# Patient Record
Sex: Female | Born: 1965 | Race: Black or African American | Hispanic: No | Smoking: Former smoker
Health system: Southern US, Community
[De-identification: ages and names within clinical notes are randomized; demographics above are authoritative.]

## PROBLEM LIST (undated history)

## (undated) DIAGNOSIS — Z1231 Encounter for screening mammogram for malignant neoplasm of breast: Principal | ICD-10-CM

## (undated) DIAGNOSIS — Z95810 Presence of automatic (implantable) cardiac defibrillator: Secondary | ICD-10-CM

## (undated) DIAGNOSIS — E782 Mixed hyperlipidemia: Secondary | ICD-10-CM

## (undated) DIAGNOSIS — R748 Abnormal levels of other serum enzymes: Secondary | ICD-10-CM

## (undated) DIAGNOSIS — E559 Vitamin D deficiency, unspecified: Secondary | ICD-10-CM

## (undated) DIAGNOSIS — I10 Essential (primary) hypertension: Secondary | ICD-10-CM

## (undated) DIAGNOSIS — D860 Sarcoidosis of lung: Secondary | ICD-10-CM

## (undated) DIAGNOSIS — E669 Obesity, unspecified: Secondary | ICD-10-CM

## (undated) DIAGNOSIS — J4520 Mild intermittent asthma, uncomplicated: Secondary | ICD-10-CM

## (undated) DIAGNOSIS — G4733 Obstructive sleep apnea (adult) (pediatric): Secondary | ICD-10-CM

## (undated) DIAGNOSIS — K219 Gastro-esophageal reflux disease without esophagitis: Secondary | ICD-10-CM

## (undated) DIAGNOSIS — J381 Polyp of vocal cord and larynx: Secondary | ICD-10-CM

## (undated) DIAGNOSIS — J310 Chronic rhinitis: Secondary | ICD-10-CM

## (undated) DIAGNOSIS — D869 Sarcoidosis, unspecified: Secondary | ICD-10-CM

## (undated) DIAGNOSIS — B379 Candidiasis, unspecified: Secondary | ICD-10-CM

## (undated) DIAGNOSIS — T3695XA Adverse effect of unspecified systemic antibiotic, initial encounter: Secondary | ICD-10-CM

## (undated) DIAGNOSIS — Z6841 Body Mass Index (BMI) 40.0 and over, adult: Secondary | ICD-10-CM

## (undated) HISTORY — PX: DENTAL SURGERY: SHX609

## (undated) HISTORY — DX: Obesity, unspecified: E66.9

## (undated) HISTORY — DX: Obstructive sleep apnea (adult) (pediatric): G47.33

## (undated) HISTORY — DX: Sarcoidosis of lung: D86.0

## (undated) HISTORY — DX: Chronic rhinitis: J31.0

## (undated) HISTORY — PX: BRONCHOSCOPY: SUR163

## (undated) HISTORY — PX: FRACTURE SURGERY: SHX138

## (undated) HISTORY — DX: Gastro-esophageal reflux disease without esophagitis: K21.9

## (undated) HISTORY — DX: Mild intermittent asthma, uncomplicated: J45.20

## (undated) HISTORY — DX: Polyp of vocal cord and larynx: J38.1

## (undated) MED FILL — LORATADINE 10MG TABS: 10 MG | 30 days supply | Qty: 30 | Fill #0 | Status: AC

---

## 1999-03-31 ENCOUNTER — Ambulatory Visit: Admission: RE | Admit: 1999-03-31 | Discharge: 1999-03-31 | Payer: Self-pay | Admitting: Pulmonary Disease

## 1999-04-03 ENCOUNTER — Encounter: Payer: Self-pay | Admitting: Pulmonary Disease

## 1999-04-03 ENCOUNTER — Ambulatory Visit: Admission: RE | Admit: 1999-04-03 | Discharge: 1999-04-03 | Payer: Self-pay | Admitting: Pulmonary Disease

## 1999-04-03 ENCOUNTER — Encounter (INDEPENDENT_AMBULATORY_CARE_PROVIDER_SITE_OTHER): Payer: Self-pay

## 1999-04-03 ENCOUNTER — Other Ambulatory Visit: Admission: RE | Admit: 1999-04-03 | Discharge: 1999-04-03 | Payer: Self-pay | Admitting: Pulmonary Disease

## 1999-11-16 ENCOUNTER — Ambulatory Visit: Admission: RE | Admit: 1999-11-16 | Discharge: 1999-11-16 | Payer: Self-pay | Admitting: Pulmonary Disease

## 2000-03-19 ENCOUNTER — Ambulatory Visit: Admission: RE | Admit: 2000-03-19 | Discharge: 2000-03-19 | Payer: Self-pay | Admitting: Pulmonary Disease

## 2001-04-22 ENCOUNTER — Ambulatory Visit: Admission: RE | Admit: 2001-04-22 | Discharge: 2001-04-22 | Payer: Self-pay | Admitting: Pulmonary Disease

## 2003-08-19 ENCOUNTER — Emergency Department (HOSPITAL_COMMUNITY): Admission: EM | Admit: 2003-08-19 | Discharge: 2003-08-19 | Payer: Self-pay | Admitting: Emergency Medicine

## 2004-07-19 ENCOUNTER — Ambulatory Visit: Payer: Self-pay | Admitting: Pulmonary Disease

## 2004-09-12 ENCOUNTER — Ambulatory Visit: Payer: Self-pay | Admitting: Pulmonary Disease

## 2005-01-05 ENCOUNTER — Ambulatory Visit: Payer: Self-pay | Admitting: Pulmonary Disease

## 2005-06-13 ENCOUNTER — Ambulatory Visit: Payer: Self-pay | Admitting: Pulmonary Disease

## 2005-07-06 ENCOUNTER — Ambulatory Visit (HOSPITAL_BASED_OUTPATIENT_CLINIC_OR_DEPARTMENT_OTHER): Admission: RE | Admit: 2005-07-06 | Discharge: 2005-07-06 | Payer: Self-pay | Admitting: Pulmonary Disease

## 2005-07-19 ENCOUNTER — Ambulatory Visit: Payer: Self-pay | Admitting: Pulmonary Disease

## 2005-08-06 ENCOUNTER — Ambulatory Visit: Payer: Self-pay | Admitting: Pulmonary Disease

## 2005-09-03 ENCOUNTER — Ambulatory Visit: Payer: Self-pay | Admitting: Pulmonary Disease

## 2005-10-10 ENCOUNTER — Ambulatory Visit: Payer: Self-pay | Admitting: Emergency Medicine

## 2005-12-06 ENCOUNTER — Ambulatory Visit: Payer: Self-pay | Admitting: Pulmonary Disease

## 2006-05-01 ENCOUNTER — Ambulatory Visit: Payer: Self-pay | Admitting: Pulmonary Disease

## 2006-08-06 ENCOUNTER — Encounter: Admission: RE | Admit: 2006-08-06 | Discharge: 2006-11-04 | Payer: Self-pay | Admitting: Specialist

## 2006-08-28 ENCOUNTER — Ambulatory Visit: Payer: Self-pay | Admitting: Pulmonary Disease

## 2006-10-24 ENCOUNTER — Ambulatory Visit: Payer: Self-pay | Admitting: Pulmonary Disease

## 2007-04-01 ENCOUNTER — Ambulatory Visit: Payer: Self-pay | Admitting: Pulmonary Disease

## 2007-04-18 ENCOUNTER — Ambulatory Visit: Payer: Self-pay | Admitting: Pulmonary Disease

## 2007-06-12 ENCOUNTER — Telehealth (INDEPENDENT_AMBULATORY_CARE_PROVIDER_SITE_OTHER): Payer: Self-pay | Admitting: *Deleted

## 2007-07-16 ENCOUNTER — Ambulatory Visit: Payer: Self-pay | Admitting: Pulmonary Disease

## 2007-07-16 DIAGNOSIS — J310 Chronic rhinitis: Secondary | ICD-10-CM

## 2007-07-16 DIAGNOSIS — D860 Sarcoidosis of lung: Secondary | ICD-10-CM

## 2007-07-16 DIAGNOSIS — R498 Other voice and resonance disorders: Secondary | ICD-10-CM | POA: Insufficient documentation

## 2007-07-16 DIAGNOSIS — K219 Gastro-esophageal reflux disease without esophagitis: Secondary | ICD-10-CM | POA: Insufficient documentation

## 2007-07-16 DIAGNOSIS — G4733 Obstructive sleep apnea (adult) (pediatric): Secondary | ICD-10-CM

## 2007-07-31 ENCOUNTER — Encounter: Payer: Self-pay | Admitting: Pulmonary Disease

## 2007-08-12 ENCOUNTER — Encounter: Admission: RE | Admit: 2007-08-12 | Discharge: 2007-08-13 | Payer: Self-pay | Admitting: Otolaryngology

## 2007-09-04 ENCOUNTER — Encounter: Payer: Self-pay | Admitting: Pulmonary Disease

## 2007-09-08 ENCOUNTER — Telehealth (INDEPENDENT_AMBULATORY_CARE_PROVIDER_SITE_OTHER): Payer: Self-pay | Admitting: *Deleted

## 2007-09-24 ENCOUNTER — Ambulatory Visit: Payer: Self-pay | Admitting: Pulmonary Disease

## 2007-11-20 ENCOUNTER — Telehealth: Payer: Self-pay | Admitting: Internal Medicine

## 2007-11-20 DIAGNOSIS — H3400 Transient retinal artery occlusion, unspecified eye: Secondary | ICD-10-CM

## 2007-12-02 ENCOUNTER — Ambulatory Visit: Payer: Self-pay | Admitting: Pulmonary Disease

## 2007-12-02 LAB — CONVERTED CEMR LAB
AST: 30 units/L (ref 0–37)
Albumin: 3 g/dL — ABNORMAL LOW (ref 3.5–5.2)
BUN: 10 mg/dL (ref 6–23)
Basophils Relative: 0.3 % (ref 0.0–1.0)
Calcium: 9.3 mg/dL (ref 8.4–10.5)
Chloride: 105 meq/L (ref 96–112)
Creatinine, Ser: 1 mg/dL (ref 0.4–1.2)
Eosinophils Absolute: 0.4 10*3/uL (ref 0.0–0.7)
Eosinophils Relative: 7.7 % — ABNORMAL HIGH (ref 0.0–5.0)
GFR calc Af Amer: 79 mL/min
GFR calc non Af Amer: 65 mL/min
HCT: 31.2 % — ABNORMAL LOW (ref 36.0–46.0)
Hemoglobin: 10.2 g/dL — ABNORMAL LOW (ref 12.0–15.0)
MCHC: 32.8 g/dL (ref 30.0–36.0)
MCV: 79 fL (ref 78.0–100.0)
Monocytes Absolute: 0.5 10*3/uL (ref 0.1–1.0)
Neutro Abs: 2.7 10*3/uL (ref 1.4–7.7)
RBC: 3.96 M/uL (ref 3.87–5.11)
Sed Rate: 43 mm/hr — ABNORMAL HIGH (ref 0–22)
TSH: 1.62 microintl units/mL (ref 0.35–5.50)
WBC: 4.9 10*3/uL (ref 4.5–10.5)

## 2007-12-17 ENCOUNTER — Ambulatory Visit: Payer: Self-pay | Admitting: Internal Medicine

## 2007-12-18 ENCOUNTER — Telehealth: Payer: Self-pay | Admitting: Pulmonary Disease

## 2007-12-18 ENCOUNTER — Ambulatory Visit: Payer: Self-pay | Admitting: Pulmonary Disease

## 2007-12-30 ENCOUNTER — Ambulatory Visit: Payer: Self-pay | Admitting: Pulmonary Disease

## 2007-12-31 ENCOUNTER — Encounter: Payer: Self-pay | Admitting: Pulmonary Disease

## 2008-01-12 ENCOUNTER — Telehealth: Payer: Self-pay | Admitting: Pulmonary Disease

## 2008-01-15 ENCOUNTER — Encounter: Admission: RE | Admit: 2008-01-15 | Discharge: 2008-01-15 | Payer: Self-pay | Admitting: Family Medicine

## 2008-01-23 ENCOUNTER — Ambulatory Visit: Payer: Self-pay | Admitting: Pulmonary Disease

## 2008-02-25 ENCOUNTER — Ambulatory Visit: Payer: Self-pay | Admitting: Pulmonary Disease

## 2008-04-06 ENCOUNTER — Ambulatory Visit: Payer: Self-pay | Admitting: Pulmonary Disease

## 2008-08-23 ENCOUNTER — Ambulatory Visit: Payer: Self-pay | Admitting: Pulmonary Disease

## 2008-08-23 DIAGNOSIS — J4520 Mild intermittent asthma, uncomplicated: Secondary | ICD-10-CM | POA: Insufficient documentation

## 2008-08-25 ENCOUNTER — Telehealth: Payer: Self-pay | Admitting: Pulmonary Disease

## 2008-10-11 ENCOUNTER — Encounter: Payer: Self-pay | Admitting: Pulmonary Disease

## 2008-10-29 ENCOUNTER — Ambulatory Visit: Payer: Self-pay | Admitting: Pulmonary Disease

## 2008-10-29 ENCOUNTER — Encounter: Payer: Self-pay | Admitting: Pulmonary Disease

## 2009-01-21 ENCOUNTER — Encounter: Admission: RE | Admit: 2009-01-21 | Discharge: 2009-01-21 | Payer: Self-pay | Admitting: Family Medicine

## 2009-04-22 ENCOUNTER — Encounter (INDEPENDENT_AMBULATORY_CARE_PROVIDER_SITE_OTHER): Payer: Self-pay | Admitting: *Deleted

## 2009-04-22 ENCOUNTER — Ambulatory Visit: Payer: Self-pay | Admitting: Pulmonary Disease

## 2009-08-01 ENCOUNTER — Ambulatory Visit: Payer: Self-pay | Admitting: Pulmonary Disease

## 2009-08-01 DIAGNOSIS — Z6841 Body Mass Index (BMI) 40.0 and over, adult: Secondary | ICD-10-CM

## 2009-09-19 ENCOUNTER — Encounter: Payer: Self-pay | Admitting: Pulmonary Disease

## 2009-12-08 ENCOUNTER — Ambulatory Visit: Payer: Self-pay | Admitting: Pulmonary Disease

## 2009-12-08 DIAGNOSIS — F329 Major depressive disorder, single episode, unspecified: Secondary | ICD-10-CM

## 2010-01-05 ENCOUNTER — Telehealth (INDEPENDENT_AMBULATORY_CARE_PROVIDER_SITE_OTHER): Payer: Self-pay | Admitting: *Deleted

## 2010-06-08 ENCOUNTER — Ambulatory Visit: Payer: Self-pay | Admitting: Pulmonary Disease

## 2010-07-22 ENCOUNTER — Encounter: Payer: Self-pay | Admitting: Family Medicine

## 2010-07-23 ENCOUNTER — Encounter: Payer: Self-pay | Admitting: Family Medicine

## 2010-08-01 NOTE — Assessment & Plan Note (Signed)
Summary: 6 months/ mbw   Visit Type:  Follow-up Primary Provider/Referring Provider:  women's health  CC:  Asthma / Sarcoid / OSA...no breathing or sleep complaints...she does c/o sinus drainage w/ a dry cough x1 week.  History of Present Illness: I saw Anna [name] in follow up for her sarcoid with pulmonary and ocular invovlement, and mild OSA.  She continues to have intermittent episodes of sinus congestion.  This is not much of a problem.  She has been sleeping okay.  She is not having any problems with her breathing.    She is being followed by a new eye doctor, but does not recall which doctor this is.  Preventive Screening-Counseling & Management  Alcohol-Tobacco     Smoking Status: quit  Current Medications (verified): 1)  No Daily Medications  Allergies (verified): No Known Drug Allergies  Past History:  Past Medical History: Pulmonary and ocular sarcoidosis      - Biopsy 10/00      - PFT 10/29/08 FEV1 2.24 (77%), FVC 2.78 (74%), TLC 3.72 (68%), DLCO 61%, no BD response Vocal cord polyp with hoarseness      - Dr. Altamese Cabal GERD OSA      - PSG 11/03/05 RDI 6 Obesity Mild, intermittent asthma Rhinitis Glaucoma  Past Surgical History: Reviewed history from 12/02/2007 and no changes required. Bronchoscopy 10/00  Vital Signs:  Patient profile:   45 year old female Height:      66 inches (167.64 cm) Weight:      280.50 pounds (127.50 kg) BMI:     45.44 O2 Sat:      98 % on Room air Temp:     97.5 degrees F (36.39 degrees C) oral Pulse rate:   93 / minute BP sitting:   142 / 70  (left arm) Cuff size:   large  Vitals Entered By: Anna [name] CMA (June 08, 2010 9:38 AM)  O2 Sat at Rest %:  98 O2 Flow:  Room air CC: Asthma / Sarcoid / OSA...no breathing or sleep complaints...she does c/o sinus drainage w/ a dry cough x1 week Comments Medications reviewed with patient Anna [name] [facility]  June 08, 2010 9:39 AM   Physical Exam  General:  normal  appearance, healthy appearing, and obese.   Nose:  clear nasal discharge.   Mouth:  no oral lesion Neck:  no JVD.   Lungs:  no wheezing or rales Heart:  regular rhythm, normal rate, and no murmurs.   Extremities:  no edema Neurologic:  normal CN II-XII and strength normal.   Cervical Nodes:  no significant adenopathy Psych:  alert and cooperative; normal mood and affect; normal attention span and concentration   Impression & Recommendations:  Problem # 1:  ASTHMA (ICD-493.90) She is not currently on therapy for this.  Advised her to call if she has a flare of her asthma.  Problem # 2:  SARCOIDOSIS (ICD-135) This has been stable.  Will repeat chest xray today.  Problem # 3:  CHRONIC RHINITIS (ICD-472.0) Continue as needed therapy with nasal irrigation.  Complete Medication List: 1)  No Daily Medications   Other Orders: Est. Patient Level III (16109) T-2 View CXR (71020TC)  Patient Instructions: 1)  Chest xray today 2)  Follow up in one year

## 2010-08-01 NOTE — Assessment & Plan Note (Signed)
Summary: 4 months/apc   Visit Type:  Follow-up Primary Provider/Referring Provider:  women's health  CC:  Sarcoid.  The patient has no complaints today.Marland Kitchen  History of Present Illness: I saw Ms. Hoppe in follow up for her sarcoid with pulmonary and ocular invovlement, and mild OSA.  Her breathing has been doing okay.  She is not having cough, wheeze, sputum, chest pain, hemoptysis, or fever.  She has not had gland swelling or skin rash.  Her sinuses have been okay.  She was seen by her eye doctor earlier today, and was told her pressures were on the high side.  She is to have a follow up visit in two weeks.  She has been feeling depressed about what happened to her sister.  Current Medications (verified): 1)  No Daily Medications  Allergies (verified): No Known Drug Allergies  Past History:  Past Medical History: Reviewed history from 10/29/2008 and no changes required. Pulmonary and ocular sarcoidosis      - Biopsy 10/00      - PFT 10/29/08 FEV1 2.24 (77%), FVC 2.78 (74%), TLC 3.72 (68%), DLCO 61%, no BD response Vocal cord polyp with hoarseness      - Dr. Altamese Cabal GERD OSA      - PSG 11/03/05 RDI 6 Obesity Asthma Rhinitis Glaucoma  Past Surgical History: Reviewed history from 12/02/2007 and no changes required. Bronchoscopy 10/00  Vital Signs:  Patient profile:   45 year old female Height:      66 inches (167.64 cm) Weight:      288 pounds (130.91 kg) BMI:     46.65 O2 Sat:      98 % on Room air Temp:     98.3 degrees F (36.83 degrees C) oral Pulse rate:   116 / minute BP sitting:   122 / 80  (right arm) Cuff size:   large  Vitals Entered By: Michel Bickers CMA (December 08, 2009 1:42 PM)  O2 Sat at Rest %:  98 O2 Flow:  Room air  O2 Sat Comments Medications reviewed. Daytime phone verified. Michel Bickers CMA  December 08, 2009 1:43 PM  Physical Exam  General:  normal appearance, healthy appearing, and obese.   Nose:  clear nasal discharge.   Mouth:  no  oral lesion Neck:  no JVD.   Lungs:  no wheezing or rales Heart:  regular rhythm, normal rate, and no murmurs.   Extremities:  no edema Cervical Nodes:  no significant adenopathy Psych:  anxious.     Impression & Recommendations:  Problem # 1:  ASTHMA (ICD-493.90)  She has mild, intermittent asthma.  Will continue clinical observation.  Problem # 2:  SARCOIDOSIS (ICD-135)  Stable.  Will continue clinical observation.  She will call to let us know who her new eye doctor is.  Problem # 3:  CHRONIC RHINITIS (ICD-472.0) Stable.  Monitor clinically.  Problem # 4:  OBSTRUCTIVE SLEEP APNEA (ICD-327.23) She is going to work more on getting her weight down.  Problem # 5:  DEPRESSION (ICD-311) She has been down about what happened to her sister.  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  Complete Medication List: 1)  No Daily Medications   Other Orders: Est. Patient Level III (16109)  Patient Instructions: 1)  Follow up in 6 months

## 2010-08-01 NOTE — Letter (Signed)
Summary: No Show/[facility]  No Show/[facility]   Imported By: Sherian Rein 09/22/2009 12:23:51  _____________________________________________________________________  External Attachment:    Type:   Image     Comment:   External Document

## 2010-08-01 NOTE — Progress Notes (Signed)
Summary: letter-AWAIT DICTATION  Phone Note Call from Patient   Caller: Patient Call For: sood Summary of Call: pt need letter stating her condition of sarcoidosis Initial call taken by: Rickard Patience,  January 05, 2010 11:05 AM  Follow-up for Phone Call        Please advise. Zackery Barefoot CMA  January 05, 2010 12:28 PM   Additional Follow-up for Phone Call Additional follow up Details #1::        letter dictated. Additional Follow-up by: [name] MD,  January 11, 2010 2:29 PM    Additional Follow-up for Phone Call Additional follow up Details #2::    Will await dication to flow into EMR Vernie Murders  January 11, 2010 2:31 PM  Letter was signed by VS.  Called pt to let her know this was ready.  "number or code dialed is incorrect"- unable reach pt so I mailed the letter to her home address.   ATC pt at home # to inform her of the above information.  Was informed I had the wrong #.  Person on the other end of the line verified the # I called and stated he "just was issued this # on Sunday."  Therefore, will await for pt to call back and give [place] a # where she can be reached at.Arman Filter LPN  January 16, 2010 3:36 PM

## 2010-08-01 NOTE — Assessment & Plan Note (Signed)
Summary: rov/jd   Copy to:  Dr. [facility], Christia Reading Primary Provider/Referring Provider:  women's health  CC:  Asthma.  OSA.  Sarcoid.  The patient has no complaints or concerns today.Anna [name]  History of Present Illness: I saw Ms. Anna [name] in follow up for her sarcoid with pulmonary and ocular invovlement, and mild OSA.  She has been doing okay.  She is off her eye medicines.  She has some sniffles, but otherwise her sinuses are okay.  She has occasional cough.  She is not having much hoarseness, and is singing in her church more without difficulty.  She is not having any problems with her breathing.  She has not had any skin rashes or gland swelling.  She has been trying to get her weight down, but has not had success.    Current Medications (verified): 1)  No Daily Medications  Allergies (verified): No Known Drug Allergies  Past History:  Past Medical History: Reviewed history from 10/29/2008 and no changes required. Pulmonary and ocular sarcoidosis      - Biopsy 10/00      - PFT 10/29/08 FEV1 2.24 (77%), FVC 2.78 (74%), TLC 3.72 (68%), DLCO 61%, no BD response Vocal cord polyp with hoarseness      - Dr. Altamese Cabal GERD OSA      - PSG 11/03/05 RDI 6 Obesity Asthma Rhinitis Glaucoma  Past Surgical History: Reviewed history from 12/02/2007 and no changes required. Bronchoscopy 10/00  Vital Signs:  Patient profile:   45 year old female Height:      66 inches (167.64 cm) Weight:      294.13 pounds (133.70 kg) BMI:     47.65 O2 Sat:      97 % on Room air Temp:     98.2 degrees F (36.78 degrees C) oral Pulse rate:   79 / minute BP sitting:   126 / 82  (left arm) Cuff size:   large  Vitals Entered By: Michel Bickers CMA (August 01, 2009 10:07 AM)  O2 Sat at Rest %:  64 O2 Flow:  Room air  Physical Exam  General:  normal appearance, healthy appearing, and obese.   Nose:  clear nasal discharge.   Mouth:  no oral lesion Neck:  no JVD.   Lungs:   no wheezing or rales Heart:  regular rhythm, normal rate, and no murmurs.   Abdomen:  obese, soft Extremities:  no edema Cervical Nodes:  no significant adenopathy   Impression & Recommendations:  Problem # 1:  SARCOIDOSIS (ICD-135) Stable.  Will continue clinical observation.  Problem # 2:  HOARSENESS (ICD-784.49) Improved.  Problem # 3:  CHRONIC RHINITIS (ICD-472.0) This has improved.  Discussed environmental control techniques.  Problem # 4:  OBSTRUCTIVE SLEEP APNEA (ICD-327.23) She has mild apnea.  She has struggled with weight loss.  Will have her evaluated by nutrition.  Problem # 5:  OBESITY (ICD-278.00) Will arrange for nutrition evaluation.  Medications Added to Medication List This Visit: 1)  No Daily Medications   Other Orders: Nutrition Referral (Nutrition) Est. Patient Level III (16109)  Patient Instructions: 1)  Follow up in 4 to 6 months

## 2010-11-14 NOTE — Assessment & Plan Note (Signed)
[facility]                             PULMONARY OFFICE NOTE   LANCE, GALAS                       MRN:          [id number]  DATE:04/01/2007                            DOB:          1965/10/13    I saw Ms. Platt in followup today for her sarcoidosis with ocular and  pulmonary involvement, asthma, reflux disease, and mild obstructive  sleep apnea.   She says that she has been having problems with hoarseness for the last  month.  She has been having problems with nasal congestion with post-  nasal drip as well as congestion in her ears.  She also gets a globus  sensation.  She has been having a dry cough that is occasionally  productive of clear sputum.  She denies any hemoptysis.  She also  complains of having itching watery eyes and problems with allergies with  the changes in seasons.  She is not having much problems as far as  wheezing, chest tightness, or dyspnea on exertion.  She said she had  chest soreness after she was exercising about a month ago, but has not  had that problem since.  She has not noticed any skin rashes or leg  swelling.   CURRENT MEDICATIONS:  Asmanex 2 puffs nightly.   PHYSICAL EXAM:  She is 262 pounds, temperature 98.6, blood pressure  138/84, heart rate is 84, oxygen saturation is 99% on room air.  HEENT:  Pupils are reactive.  There is no sinus tenderness.  She has a  clear nasal discharge.  There is mild erythema to the posterior pharynx.  She has clear fluid behind her tympanic membrane, more prominent on the  left.  There is no exudate.  There is no lymphadenopathy.  HEART:  S1, S2.  CHEST:  No wheezing or rales.  ABDOMEN:  Obese, soft, and nontender.  EXTREMITIES:  There is no edema.   IMPRESSION:  1. Sarcoidosis with ocular and pulmonary involvement.  I will have her      undergo a chest x-ray as well as arrange for her to undergo      pulmonary function test to further monitor this.  2. Hoarseness  with chronic rhinitis.  I have instructed her on the use      of nasal irrigation, and I have given her a sample and prescription      for Veramyst, which she is to use 2 sprays to each nostril once      daily.  I am hoping that by optimizing her sinus regimen, this will      improve her hoarseness and her cough.  If not, then she may need to      have evaluation by ENT.  3. Reflux.  She is not currently on treatment for this.  I will assess      her response to her sinus regimen.  If this does not allow her      significant improvement in her symptoms, then she may need to      reinitiate her reflux medications to see if this could help  with      her symptoms of hoarseness.  4. Mild obstructive sleep apnea.  I discussed with her the importance      of diet, exercise, and weight reduction.  If she continues to gain      weight, we may need to re-address whether she needs to be treated      for her sleep apnea.  5. Mild increase in her blood pressure.  I would monitor her for this,      but if this is persistently elevated, she may need additional      therapy for this.   I will follow up with her in approximately 1 month.     [name], MD  Electronically Signed    VS/MedQ  DD: 04/01/2007  DT: 04/01/2007  Job #: [id number]   cc:   [facility]  [facility]

## 2010-11-17 NOTE — Assessment & Plan Note (Signed)
[facility]                               PULMONARY OFFICE NOTE   Anna [name], Anna [name]                       MRN:          [id number]  DATE:05/01/2006                            DOB:          03-16-1966    Anna [name] is a very pleasant 45 year old [place] American female, who I follow  here for sarcoidosis.  She presents today for followup.  She actually has  been doing very well as far as her symptomatology is concerned.  However,  she recently ran out of Asmanex and has noticed some increase in cough.  She  also requests that her disability forms be filled out today.   CURRENT MEDICATIONS:  As noted on the intake sheet.  These have been  reviewed, and are accurate.   PHYSICAL EXAMINATION:  VITAL SIGNS:  Are as noted.  Oxygen saturation is 98%  on room air.  GENERAL:  This is a well-developed, obese [place] American female, who is in  no acute distress.  HEENT:  Examination is unremarkable.  NECK:  Supple, no adenopathy noted, no JVD.  LUNGS:  Clear to auscultation bilaterally.  CARDIAC:  Regular rate and rhythm, no murmurs, rubs or gallops heard.  EXTREMITIES:  No cyanosis, no clubbing, no edema noted.   IMPRESSION:  1. Sarcoidosis with mild asthmatic bronchitis, reactive airways.   PLAN:  1. Resume Asmanex.  2. I filled out the patient's forms for disability.  However, the patient      has been advised that I recommend that, after 120 day period, she      should consider at least starting some vocational program and training.      The patient understands this.  3. Followup will be in 3 months' time.  She is to contact us prior to that      time should any new problems arise.     Gailen Shelter, MD  Electronically Signed    CLG/MedQ  DD: 05/04/2006  DT: 05/05/2006  Job #: [id number]

## 2010-11-17 NOTE — Assessment & Plan Note (Signed)
[facility]                             PULMONARY OFFICE NOTE   RUDI, KNIPPENBERG                       MRN:          [id number]  DATE:10/24/2006                            DOB:          [birth year]    This is a very pleasant 45 year old [place] American female who I follow  here for sarcoidosis and mild persistent asthma.  She also has  gastroesophageal reflux.  She in the past had been plagued with  complaints of dyspnea, fatigability and lack of vim, vigor and vitality.  The patient has in the past resolved these issues after having received  a course of prednisone for her symptomatic sarcoid.  She has been pretty  much steroid free for approximately 2 years.  She is maintained on  Asmanex for mild persistent asthma. She also uses occasional albuterol  inhaler, though she has not really needed this.  The patient's other  issues are more related to obesity.  She has had issues with weight  gain, particularly during her time of prednisone therapy.  During her  last visit in February 2008 she was 262 pounds.  She is down to 250  today and continues to be adhering to a diet and fitness program.  I  commended her on this.  Today she presents stating that she is doing  well.  She has no issues with dyspnea.  She has had no episodes of  wheezing and she has had no cough.  We did do a mini review of systems.  She has had no snoring, no daytime sleepiness and wakes up refreshed.  She does not have to take frequent daytime naps.   CURRENT MEDICATIONS:  Are as noted on the intake sheet.  These have been  reviewed and are accurate.   PHYSICAL EXAMINATION:  VITAL SIGNS:  Noted; oxygen saturation is 95% on  room air.  GENERAL:  This is a well-developed, obese female who is in no acute  distress.  HEENT:  Examination is unremarkable.  NECK:  Is supple. No adenopathy noted.  No JVD.  LUNGS:  Clear to auscultation bilaterally.  CARDIAC EXAMINATION: Regular  rate and rhythm, no rubs, rales or gallops.  LOWER EXTREMITIES:  No cyanosis, no clubbing, no edema noted.   IMPRESSION:  1. Sarcoidosis with pulmonary and extra pulmonary manifestations being      ocular.  Currently well compensated off of systemic steroids.  2. Mild persistent asthma, well compensated on Asmanex.  3. Gastroesophageal reflux, compensated on Prilosec.   PLAN:  1. Will be for the patient to continue medications as they are.  2. Followup will be in 3 months' time with pulmonary function testing      at that time.  I will assign her to see Dr. [name], as I will      be leaving the practice.    Gailen Shelter, MD  Electronically Signed   CLG/MedQ  DD: 10/24/2006  DT: 10/24/2006  Job #: [id number]

## 2010-11-17 NOTE — Assessment & Plan Note (Signed)
 [facility]                             PULMONARY OFFICE NOTE   Anna [name], Anna [name]                       MRN:          [id number]  DATE:08/28/2006                            DOB:          01/29/1966    This is a very pleasant 45 year old [place] American female who follows  here for sarcoidosis.  She also has mild asthma usually triggered by  gastroesophageal reflux.  The patient is actually fairly well  compensated with her symptoms having no respiratory symptoms currently.  She has had some difficulties with headaches that have been evaluated  thoroughly by her primary care physician and by the headache center and  has been found to have migraines.  She was prescribed Topamax but  decided not to take this because of the side effect profile.   CURRENT MEDICATIONS:  As noted on the intake sheet, these have been  reviewed and are accurate.   PHYSICAL EXAMINATION:  VITAL SIGNS:  Noted, oxygen saturation was 98% on  room air.  Weight is down to 262 pounds.  GENERAL:  This well-developed, obese, African American female who is in  no acute distress.  HEENT:  Unremarkable.  NECK:  Supple, no adenopathy noted, no JVD.  LUNGS:  Clear to auscultation bilaterally.  CARDIAC:  Regular rate and rhythm, no rubs, murmurs, or gallops heard.  EXTREMITIES:  The patient has no cyanosis, no clubbing, no edema.   IMPRESSION:  1. Sarcoidosis.  The patient is actually fairly well compensated, has      not had a flare.  2. Mild asthma.  Usually triggered by gastroesophageal reflux, also      well controlled.  The patient with no recent reflux symptoms, using      only Prilosec OTC.   PLAN:  1. The patient to continue her Asmanex.  2. Continue Prilosec OTC p.r.n.  3. Followup will be in 2 month's time.  She is to contact us prior to      that time should any problems arise.     Gailen Shelter, MD  Electronically Signed    CLG/MedQ  DD: 08/28/2006  DT:  08/28/2006  Job #: [id number]

## 2010-11-17 NOTE — Letter (Signed)
January 11, 2010     RE:  [name], [name]  MRN:  [id number]  /  DOB:  [birth year]   To Whom It May Concern:   Anna [name] is currently under my care at [facility].  She has a  history of sarcoidosis with pulmonary and ocular involvement.  In  addition, she has a history of mild, intermittent asthma and chronic  rhinitis.  If you have any questions regarding this, please feel free to  contact me at [phone].    Sincerely,      [name], MD  Electronically Signed    VS/MedQ  DD: 01/11/2010  DT: 01/12/2010  Job #: [id number]

## 2010-11-17 NOTE — Letter (Signed)
July 18, 2006     RE:  [name], Anna  MRN:  [id number]  /  DOB:  [birth year]   To whom it may concern,   This is in regards to the above captioned patient.  Anna [name] has been  a patient at [facility] since September of 2000.  The patient  had previously been diagnosed with sarcoidosis when she lived in  [place], [place].  She initially presented with weight loss, ocular  changes, and Bell's palsy.  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  The patient has difficulties  with her sarcoid flaring up on occasion and requires assistance with  transportation, etc., during these episodes of flare.   The patient seems to do better and does not have any flares when she is  not physically or emotionally stressed.   Please take this into consideration when managing Anna [name]'s case.  She does require medical visits to manage her disease.   Should you require any further information, please do not hesitate to  contact this office.    Sincerely,      C. Danice Goltz, MD  Electronically Signed    CLG/MedQ  DD: 07/18/2006  DT: 07/18/2006  Job #: [id number]

## 2010-11-17 NOTE — Procedures (Signed)
NAME:  Anna [name], Anna [name] NO.:  [id number]   MEDICAL RECORD NO.:  [id number]          PATIENT TYPE:  OUT   LOCATION:  SLEEP CENTER                 FACILITY:  [facility]   PHYSICIAN:  Marcelyn Bruins, M.D. [facility] DATE OF BIRTH:  1965/11/27   DATE OF STUDY:  07/06/2005                              NOCTURNAL POLYSOMNOGRAM   REFERRING PHYSICIAN:  Dr. Danice Goltz.   DATE OF STUDY:  July 06, 2005.   INDICATION FOR STUDY:  Hypersomnia with sleep apnea.   EPWORTH SCORE:  6.   SLEEP ARCHITECTURE:  The patient total sleep time of 192 minutes with  decreased REM and never achieved slow wave sleep. Sleep onset latency was  prolonged at 48 minutes and REM onset very prolonged at 279 minutes. Some  sleep efficiency was significantly decreased at 58%.   RESPIRATORY DATA:  The patient was found to have 25 hypopneas for a  respiratory disturbance index of 6 events per hour. The events were clearly  worse in the supine position and mild snoring was noted. 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 Clinical correlation  is suggested.   OXYGEN DATA:  The patient had O2 desaturation as low as 87%.   CARDIAC DATA:  The patient was noted to have occasional PVC.   MOVEMENT/PARASOMNIA:  There were 44 leg jerks during the study but only 2  that resulted in arousal or awakening.   IMPRESSION/RECOMMENDATIONS:  Very mild obstructive sleep apnea/hypopnea  syndrome with a respiratory disturbance index of 6 events per hour and O2  desaturation as low as 87%. Events were worse in the supine position.  Treatment for this degree of sleep apnea may include weight loss alone if  applicable, upper airway surgery, oral  appliance, C-PAP and finally a positional therapy since the events seem to  be worse in the supine position and her overall degree of apnea was fairly  mild.     ______________________________  Marcelyn Bruins, M.D. [facility]  Diplomate, American Board of Sleep  Medicine     KC/MEDQ  D:  07/19/2005 16:02:39  T:  07/19/2005 23:05:15  Job:  [id number]

## 2010-11-29 ENCOUNTER — Encounter: Payer: Self-pay | Admitting: Pulmonary Disease

## 2010-11-30 ENCOUNTER — Encounter: Payer: Self-pay | Admitting: Pulmonary Disease

## 2010-11-30 ENCOUNTER — Other Ambulatory Visit (INDEPENDENT_AMBULATORY_CARE_PROVIDER_SITE_OTHER): Payer: Medicaid Other | Admitting: Pulmonary Disease

## 2010-11-30 ENCOUNTER — Ambulatory Visit (INDEPENDENT_AMBULATORY_CARE_PROVIDER_SITE_OTHER)
Admission: RE | Admit: 2010-11-30 | Discharge: 2010-11-30 | Disposition: A | Discharge status: Home / Self Care | Payer: Medicaid Other | Source: Ambulatory Visit | Attending: Pulmonary Disease | Admitting: Pulmonary Disease

## 2010-11-30 ENCOUNTER — Other Ambulatory Visit (INDEPENDENT_AMBULATORY_CARE_PROVIDER_SITE_OTHER): Payer: Medicaid Other

## 2010-11-30 ENCOUNTER — Ambulatory Visit (INDEPENDENT_AMBULATORY_CARE_PROVIDER_SITE_OTHER): Payer: Medicaid Other | Admitting: Pulmonary Disease

## 2010-11-30 DIAGNOSIS — J45909 Unspecified asthma, uncomplicated: Secondary | ICD-10-CM

## 2010-11-30 DIAGNOSIS — D869 Sarcoidosis, unspecified: Secondary | ICD-10-CM

## 2010-11-30 DIAGNOSIS — R202 Paresthesia of skin: Secondary | ICD-10-CM

## 2010-11-30 DIAGNOSIS — R200 Anesthesia of skin: Secondary | ICD-10-CM | POA: Insufficient documentation

## 2010-11-30 DIAGNOSIS — R209 Unspecified disturbances of skin sensation: Secondary | ICD-10-CM

## 2010-11-30 DIAGNOSIS — R498 Other voice and resonance disorders: Secondary | ICD-10-CM

## 2010-11-30 DIAGNOSIS — G4733 Obstructive sleep apnea (adult) (pediatric): Secondary | ICD-10-CM

## 2010-11-30 LAB — URINALYSIS, ROUTINE W REFLEX MICROSCOPIC
Hgb urine dipstick: NEGATIVE
Nitrite: NEGATIVE
Urobilinogen, UA: 0.2 (ref 0.0–1.0)

## 2010-11-30 LAB — COMPREHENSIVE METABOLIC PANEL
AST: 30 U/L (ref 0–37)
Albumin: 3.1 g/dL — ABNORMAL LOW (ref 3.5–5.2)
Alkaline Phosphatase: 95 U/L (ref 39–117)
Glucose, Bld: 85 mg/dL (ref 70–99)
Potassium: 3.9 mEq/L (ref 3.5–5.1)
Sodium: 139 mEq/L (ref 135–145)
Total Protein: 7.3 g/dL (ref 6.0–8.3)

## 2010-11-30 LAB — CBC WITH DIFFERENTIAL/PLATELET
Eosinophils Absolute: 0.2 10*3/uL (ref 0.0–0.7)
MCHC: 31.5 g/dL (ref 30.0–36.0)
MCV: 74.6 fl — ABNORMAL LOW (ref 78.0–100.0)
Monocytes Absolute: 0.4 10*3/uL (ref 0.1–1.0)
Neutrophils Relative %: 66.5 % (ref 43.0–77.0)
Platelets: 283 10*3/uL (ref 150.0–400.0)

## 2010-11-30 LAB — HEMOGLOBIN A1C: Hgb A1c MFr Bld: 5.7 % (ref 4.6–6.5)

## 2010-11-30 NOTE — Patient Instructions (Signed)
Lab, urine test, and chest xray today Will call with results Follow up in 6 months

## 2010-11-30 NOTE — Progress Notes (Signed)
Subjective:    Patient ID: Anna [name], female    DOB: 1965/10/03, 45 y.o.   MRN: [id number]  HPI 45 yo female with sarcoid with pulmonary and ocular invovlement, mild/intermittent asthma, and mild OSA.  She has noticed numbness and tingling in her hands and feet.  This has been intermittently present for several weeks. It is happening more frequently.  She denies difficulty with headache or neck pain.  She is not having trouble with her gait, strength, bowel, bladder.  She has not noticed any trouble with her vision recently.  She has not noticed trouble with her speech or swallowing.  She denies chest pain, cough, wheeze, fever, sweats, or abdominal pain.  She has not noticed increased frequency of urination.  She does not have as much of an appetite as before.  Past Medical History  Diagnosis Date  . Pulmonary sarcoidosis     and ocular.  biopsy 10/00, PFT 10/29/08 FEV1 2.24(77%), FVC 2.78(74%), TLC 3.72(68%), DLCO 61%, no BD response  . Vocal cord polyp     with hoarseness.  Dr. Altamese Cabal  . GERD (gastroesophageal reflux disease)   . OSA (obstructive sleep apnea)     PSG 11/03/05 RDI 6  . Obesity   . Mild intermittent asthma   . Rhinitis   . Glaucoma      No family history on file.   History   Social History  . Marital Status: Divorced    Spouse Name: N/A    Number of Children: N/A  . Years of Education: N/A   Occupational History  . Not on file.   Social History Main Topics  . Smoking status: Former Smoker -- 1.5 packs/day for 10 years    Types: Cigarettes    Quit date: 07/02/1997  . Smokeless tobacco: Never Used  . Alcohol Use: No  . Drug Use: Not on file  . Sexually Active: Not on file   Other Topics Concern  . Not on file   Social History Narrative  . No narrative on file     No Known Allergies   No outpatient prescriptions prior to visit.   Review of Systems     Objective:   Physical Exam  Filed Vitals:   11/30/10 1228  BP: 132/80  Pulse:  82  Temp: 98.5 F (36.9 C)  TempSrc: Oral  Height: 5\' 6"  (1.676 m)  Weight: 285 lb 12.8 oz (129.638 kg)  SpO2: 98%    General: normal appearance, healthy appearing, and obese.  Nose: clear nasal discharge.  Mouth: no oral lesion  Neck: no JVD.  Lungs: no wheezing or rales  Heart: regular rhythm, normal rate, and no murmurs.  Abd: Obese, soft, non-tender Extremities: no edema  Neurologic: normal CN II-XII, strength normal, reflexes symmetric, decreased sensation in stocking/glove distribution.  Cervical Nodes: no significant adenopathy  Psych: alert and cooperative; normal mood and affect; normal attention span and concentration     Assessment & Plan:   Numbness and tingling She has b/l numbness and tingling in her hands and feet.  Her neuro exam is otherwise unremarkable.  Will check electrolytes, renal function, liver function, B12, TSH, and HbA1C.  Will call with results.  If unrevealing, she may need further evaluation with neurology.  Explained that I am not sure her current symptoms are related to sarcoidosis.  Sarcoidosis Will repeat chest xray today.  Mild intermittent asthma Not an active issue at present.  OBSTRUCTIVE SLEEP APNEA She has been reluctant to do any  specific therapy for this.    Updated Medication List No outpatient encounter prescriptions on file as of 11/30/2010.

## 2010-11-30 NOTE — Assessment & Plan Note (Signed)
She has been reluctant to do any specific therapy for this.

## 2010-11-30 NOTE — Assessment & Plan Note (Signed)
She has b/l numbness and tingling in her hands and feet.  Her neuro exam is otherwise unremarkable.  Will check electrolytes, renal function, liver function, B12, TSH, and HbA1C.  Will call with results.  If unrevealing, she may need further evaluation with neurology.  Explained that I am not sure her current symptoms are related to sarcoidosis.

## 2010-11-30 NOTE — Assessment & Plan Note (Signed)
Will repeat chest xray today. 

## 2010-11-30 NOTE — Assessment & Plan Note (Signed)
Not an active issue at present. 

## 2010-12-01 ENCOUNTER — Telehealth: Payer: Self-pay | Admitting: Pulmonary Disease

## 2010-12-01 LAB — VITAMIN D 25 HYDROXY (VIT D DEFICIENCY, FRACTURES): Vit D, 25-Hydroxy: 13 ng/mL — ABNORMAL LOW (ref 30–89)

## 2010-12-01 NOTE — Telephone Encounter (Signed)
Discussed lab results, urine test, and chest xray with patient.  Chest xray did not show significant change.  U/A was negative.  Labs showed microcytic anemia, and vit. D deficiency.  Advised her to d/w her Gyn about menstrual bleeding contributing to anemia.  Advised to start OTC Vit. D supplement.  Advised her to call if her numbness persists, and at that point will need to have neurology evaluation.  Explained that her sarcoid does not appear to be active, and she does not need prednisone at this time.  Will have my nurse fax lab test results to her Gyn at [facility].

## 2010-12-01 NOTE — Telephone Encounter (Signed)
[id number] to get the name of her GYN so we can fax results.

## 2010-12-04 NOTE — Telephone Encounter (Signed)
Noted  

## 2010-12-04 NOTE — Telephone Encounter (Signed)
Pt returned call.  States she goes to [facility] but has not been seen "in a while."  Pt is requesting to make the appt at [facility] and once this is made states she will have their office call us so we can fax the results over to the dr she will be seeing.  She is requesting this not be faxed until she makes the appt.  Will forward message to Dr. Craige Cotta so he is aware.

## 2011-01-11 ENCOUNTER — Telehealth: Payer: Self-pay | Admitting: Pulmonary Disease

## 2011-01-11 NOTE — Telephone Encounter (Signed)
Spoke with pt. She states that VS wanted her to call and let him know once she was seen by her GYN doc. She was seen yesterday. Will forward to VS as FYI.

## 2011-01-19 NOTE — Telephone Encounter (Signed)
Please forward lab results from Nov 30, 2010 to her GYN.

## 2011-01-19 NOTE — Telephone Encounter (Signed)
Labs were faxed to her at [phone]

## 2011-03-21 ENCOUNTER — Encounter: Payer: Self-pay | Admitting: Pulmonary Disease

## 2011-03-21 ENCOUNTER — Ambulatory Visit (INDEPENDENT_AMBULATORY_CARE_PROVIDER_SITE_OTHER): Payer: Medicaid Other | Admitting: Pulmonary Disease

## 2011-03-21 VITALS — BP 110/74 | HR 78 | Temp 98.2°F | Ht 66.0 in | Wt 282.4 lb

## 2011-03-21 DIAGNOSIS — J45909 Unspecified asthma, uncomplicated: Secondary | ICD-10-CM

## 2011-03-21 DIAGNOSIS — D869 Sarcoidosis, unspecified: Secondary | ICD-10-CM

## 2011-03-21 DIAGNOSIS — J4520 Mild intermittent asthma, uncomplicated: Secondary | ICD-10-CM

## 2011-03-21 MED ORDER — ALBUTEROL SULFATE HFA 108 (90 BASE) MCG/ACT IN AERS: 2.0000 | INHALATION_SPRAY | Freq: Four times a day (QID) | RESPIRATORY_TRACT | Status: DC | PRN

## 2011-03-21 NOTE — Assessment & Plan Note (Signed)
I don't think her current symptoms are related to progression of sarcoid.  Will assess her response to inhaler therapy and review PFT's.  If her symptoms persist, then would consider further evaluation of her sarcoid.

## 2011-03-21 NOTE — Patient Instructions (Signed)
Proair two puffs up to four times per day as needed Will arrange for pulmonary function testing (breathing test) and call with results Follow up in 6 months

## 2011-03-21 NOTE — Assessment & Plan Note (Signed)
I think her current symptoms are related to asthma.  Will give her trial of albuterol inhaler and arrange for repeat PFT's.

## 2011-03-21 NOTE — Progress Notes (Signed)
  Subjective:    Patient ID: Anna [name], female    DOB: 16-Aug-1965, 45 y.o.   MRN: [id number]  HPI  45 yo female with sarcoid with pulmonary and ocular invovlement, mild/intermittent asthma, and mild OSA.  She was walking up to one mile until June.  She moved into a new house, and hasn't kept up with her exercise.  She has noticed more trouble with her breathing especially in the morning.  She has been getting some wheeze also.  She denies fever, chest pain, palpitations, or hemoptysis.  Her sinuses and throat have been okay.  She has not noticed any skin rashes.  She still gets occasional tingling in her hands, and swelling in her hands when she walks.  Past Medical History  Diagnosis Date  . Pulmonary sarcoidosis     and ocular.  biopsy 10/00, PFT 10/29/08 FEV1 2.24(77%), FVC 2.78(74%), TLC 3.72(68%), DLCO 61%, no BD response  . Vocal cord polyp     with hoarseness.  Dr. Altamese Cabal  . GERD (gastroesophageal reflux disease)   . OSA (obstructive sleep apnea)     PSG 11/03/05 RDI 6  . Obesity   . Mild intermittent asthma   . Rhinitis   . Glaucoma     No family history on file.   History   Social History  . Marital Status: Divorced   Occupational History  . Not on file.   Social History Main Topics  . Smoking status: Former Smoker -- 1.5 packs/day for 10 years    Types: Cigarettes    Quit date: 07/02/1997  . Smokeless tobacco: Never Used  . Alcohol Use: No   No Known Allergies   Review of Systems     Objective:   Physical Exam  BP 110/74  Pulse 78  Temp(Src) 98.2 F (36.8 C) (Oral)  Ht 5\' 6"  (1.676 m)  Wt 282 lb 6.4 oz (128.096 kg)  BMI 45.58 kg/m2  SpO2 99%  General - obese HEENT - no sinus tenderness, no oral exudate, no LAN Chest - no wheeze/rales/dullness, normal respiratory excursion Cardiac - s1s2 regular, no murmur Abd - obese, soft Ext - no e/c/c Neuro - normal strength, CN intact Psych - normal mood/behavior Skin - no rashes     Assessment  & Plan:   Mild intermittent asthma I think her current symptoms are related to asthma.  Will give her trial of albuterol inhaler and arrange for repeat PFT's.  Sarcoidosis I don't think her current symptoms are related to progression of sarcoid.  Will assess her response to inhaler therapy and review PFT's.  If her symptoms persist, then would consider further evaluation of her sarcoid.    Updated Medication List Outpatient Encounter Prescriptions as of 03/21/2011  Medication Sig Dispense Refill  . albuterol (PROAIR HFA) 108 (90 BASE) MCG/ACT inhaler Inhale 2 puffs into the lungs every 6 (six) hours as needed for wheezing.  1 Inhaler  5

## 2011-04-12 ENCOUNTER — Telehealth: Payer: Self-pay | Admitting: Pulmonary Disease

## 2011-04-12 ENCOUNTER — Ambulatory Visit (INDEPENDENT_AMBULATORY_CARE_PROVIDER_SITE_OTHER): Payer: Medicaid Other | Admitting: Pulmonary Disease

## 2011-04-12 DIAGNOSIS — D869 Sarcoidosis, unspecified: Secondary | ICD-10-CM

## 2011-04-12 LAB — PULMONARY FUNCTION TEST

## 2011-04-12 NOTE — Progress Notes (Signed)
PFT done today. 

## 2011-04-12 NOTE — Telephone Encounter (Signed)
Will forward to Mindy to have VS address

## 2011-04-12 NOTE — Telephone Encounter (Signed)
I have received the paperwork on pt and it is requesting that Dr. Craige Cotta elaborate on 2 of the questions on the sheet. I have placed this in your look at Dr.Sood, please advise, thanks  Carver Fila, CMA

## 2011-04-18 ENCOUNTER — Encounter: Payer: Self-pay | Admitting: Pulmonary Disease

## 2011-04-20 NOTE — Telephone Encounter (Signed)
I completed forms 04/19/11.

## 2011-06-27 ENCOUNTER — Encounter: Payer: Self-pay | Admitting: Pulmonary Disease

## 2011-09-14 ENCOUNTER — Emergency Department (HOSPITAL_COMMUNITY): Payer: Medicaid Other

## 2011-09-14 ENCOUNTER — Encounter (HOSPITAL_COMMUNITY): Payer: Self-pay | Admitting: *Deleted

## 2011-09-14 ENCOUNTER — Emergency Department (HOSPITAL_COMMUNITY)
Admission: EM | Admit: 2011-09-14 | Discharge: 2011-09-14 | Disposition: A | Discharge status: Home / Self Care | Payer: Medicaid Other | Attending: Emergency Medicine | Admitting: Emergency Medicine

## 2011-09-14 DIAGNOSIS — J45909 Unspecified asthma, uncomplicated: Secondary | ICD-10-CM | POA: Insufficient documentation

## 2011-09-14 DIAGNOSIS — M25529 Pain in unspecified elbow: Secondary | ICD-10-CM | POA: Insufficient documentation

## 2011-09-14 DIAGNOSIS — R071 Chest pain on breathing: Secondary | ICD-10-CM | POA: Insufficient documentation

## 2011-09-14 DIAGNOSIS — M79609 Pain in unspecified limb: Secondary | ICD-10-CM | POA: Insufficient documentation

## 2011-09-14 DIAGNOSIS — D869 Sarcoidosis, unspecified: Secondary | ICD-10-CM | POA: Insufficient documentation

## 2011-09-14 DIAGNOSIS — K219 Gastro-esophageal reflux disease without esophagitis: Secondary | ICD-10-CM | POA: Insufficient documentation

## 2011-09-14 DIAGNOSIS — R0789 Other chest pain: Secondary | ICD-10-CM

## 2011-09-14 MED ORDER — IBUPROFEN 800 MG PO TABS: 800.0000 mg | ORAL_TABLET | Freq: Once | ORAL | Status: DC

## 2011-09-14 MED ORDER — NAPROXEN 500 MG PO TABS: 500.0000 mg | ORAL_TABLET | Freq: Two times a day (BID) | ORAL | Status: DC

## 2011-09-14 NOTE — ED Provider Notes (Signed)
History     CSN: [id number]  Arrival date & time 09/14/11  1119   First MD Initiated Contact with Patient 09/14/11 1128      Chief Complaint  Patient presents with  . Arm Pain    left side pain  . Fall    (Consider location/radiation/quality/duration/timing/severity/associated sxs/prior treatment) HPI  Patient presents to emergency department complaining of left lateral rib pain as well as left elbow and upper arm pain stating that on Monday, 5 days ago she tripped on a sidewalk and fell landing on her left side. Patient states since then the pain in her arm and elbow have greatly improved however she's having intermittent ongoing pain in her left lateral ribs. Patient states "I just want to make sure that I didn't crack a rib." Patient states she's taken very little over-the-counter pain medicines at home because the pain had generally been improving. She denies any chest pain or shortness of breath. She denies abdominal pain, nausea, vomiting, headache, dizziness. She denies loss of consciousness at time of fall or neck or back pain. Patient states pain is aggravated by lying on left side and flailing of her rib cage. She denies any pain or difficulty ambulating.  Past Medical History  Diagnosis Date  . Pulmonary sarcoidosis     and ocular.  biopsy 10/00, PFT 10/29/08 FEV1 2.24(77%), FVC 2.78(74%), TLC 3.72(68%), DLCO 61%, no BD response  . Vocal cord polyp     with hoarseness.  Dr. Altamese Cabal  . GERD (gastroesophageal reflux disease)   . OSA (obstructive sleep apnea)     PSG 11/03/05 RDI 6  . Obesity   . Mild intermittent asthma   . Rhinitis   . Glaucoma     History reviewed. No pertinent past surgical history.  No family history on file.  History  Substance Use Topics  . Smoking status: Former Smoker -- 1.5 packs/day for 10 years    Types: Cigarettes    Quit date: 07/02/1997  . Smokeless tobacco: Never Used  . Alcohol Use: No    OB History    Grav Para Term Preterm  Abortions TAB SAB Ect Mult Living                  Review of Systems  All other systems reviewed and are negative.    Allergies  Review of patient's allergies indicates no known allergies.  Home Medications   Current Outpatient Rx  Name Route Sig Dispense Refill  . ALBUTEROL SULFATE HFA 108 (90 BASE) MCG/ACT IN AERS Inhalation Inhale 2 puffs into the lungs every 6 (six) hours as needed for wheezing. 1 Inhaler 5    BP 150/70  Pulse 7  Temp 98.9 F (37.2 C)  Resp 24  SpO2 100%  LMP 08/28/2011  Physical Exam  Nursing note and vitals reviewed. Constitutional: She is oriented to person, place, and time. She appears well-developed and well-nourished. No distress.  HENT:  Head: Normocephalic and atraumatic.  Eyes: Conjunctivae and EOM are normal. Pupils are equal, round, and reactive to light.  Neck: Normal range of motion. Neck supple.  Cardiovascular: Normal rate, regular rhythm, normal heart sounds and intact distal pulses.  Exam reveals no gallop and no friction rub.   No murmur heard. Pulmonary/Chest: Effort normal and breath sounds normal. No respiratory distress. She has no wheezes. She has no rales. She exhibits tenderness.       TTP of left lateral chest wall without skin changes or bruising.  Abdominal: Soft.  Bowel sounds are normal. She exhibits no distension and no mass. There is no tenderness. There is no rebound and no guarding.  Musculoskeletal: Normal range of motion. She exhibits no edema and no tenderness.       FROM of bilateral UE and LE without pain or difficulty. Pelvis stable and NT.   Neurological: She is alert and oriented to person, place, and time.  Skin: Skin is warm and dry. No rash noted. She is not diaphoretic. No erythema.  Psychiatric: She has a normal mood and affect.    ED Course  Procedures (including critical care time)  Labs Reviewed - No data to display Dg Ribs Unilateral W/chest Left  09/14/2011  *RADIOLOGY REPORT*  Clinical Data:  Larey Seat.  Left-sided pain.  LEFT RIBS AND CHEST - 3+ VIEW  Comparison: 11/30/2010 and previous  Findings: Heart is at the upper limits normal in size.  Chronic mediastinal and hilar lymphadenopathy remains evident.  Chronic 7 mm nodule the left upper lobe remains evident.  No evidence of pneumothorax or hemothorax.  No discernible rib fracture.  IMPRESSION: No acute bony finding.  Chronic hilar mediastinal lymphadenopathy.  Chronic 7 mm left upper lobe pulmonary nodule.  Original Report Authenticated By: Thomasenia Sales, M.D.     1. Chest wall pain       MDM  Patient is followed by Dr. Craige Cotta pulmonology and is aware of sarcoid and pulmonary nodule and states she has a follow up appointment next week. Normal lung exam with no acute findings on xray. Abdomen is soft and NT.         Jenness Corner, [place] 09/14/11 1212

## 2011-09-14 NOTE — ED Notes (Signed)
Pt states she was walking at her  Mother house and fell on the side walk and is having left side,hip, and arm pain. Pt denies any loc.

## 2011-09-14 NOTE — ED Provider Notes (Signed)
Medical screening examination/treatment/procedure(s) were performed by non-physician practitioner and as supervising physician I was immediately available for consultation/collaboration.   Dione Booze, MD 09/14/11 1535

## 2011-09-14 NOTE — Discharge Instructions (Signed)
Continue to alternate between tylenol and ibuprofen as needed for pain. Follow up with your primary care physician for recheck of ongoing pain and for follow up of pulmonary nodule. Return to ER for changing or worsening of symptoms.   Chest Wall Pain Chest wall pain is pain in or around the bones and muscles of your chest. It may take up to 6 weeks to get better. It may take longer if you must stay physically active in your work and activities.  CAUSES  Chest wall pain may happen on its own. However, it may be caused by:  A viral illness like the flu.   Injury.   Coughing.   Exercise.   Arthritis.   Fibromyalgia.   Shingles.  HOME CARE INSTRUCTIONS   Avoid overtiring physical activity. Try not to strain or perform activities that cause pain. This includes any activities using your chest or your abdominal and side muscles, especially if heavy weights are used.   Put ice on the sore area.   Put ice in a plastic bag.   Place a towel between your skin and the bag.   Leave the ice on for 15 to 20 minutes per hour while awake for the first 2 days.   Only take over-the-counter or prescription medicines for pain, discomfort, or fever as directed by your caregiver.  SEEK IMMEDIATE MEDICAL CARE IF:   Your pain increases, or you are very uncomfortable.   You have a fever.   Your chest pain becomes worse.   You have new, unexplained symptoms.   You have nausea or vomiting.   You feel sweaty or lightheaded.   You have a cough with phlegm (sputum), or you cough up blood.  MAKE SURE YOU:   Understand these instructions.   Will watch your condition.   Will get help right away if you are not doing well or get worse.  Document Released: 06/18/2005 Document Revised: 06/07/2011 Document Reviewed: 02/12/2011 [facility] Patient Information 2012 [place], [place].

## 2011-09-17 ENCOUNTER — Encounter: Payer: Self-pay | Admitting: Pulmonary Disease

## 2011-09-17 ENCOUNTER — Ambulatory Visit (INDEPENDENT_AMBULATORY_CARE_PROVIDER_SITE_OTHER): Payer: Medicaid Other | Admitting: Pulmonary Disease

## 2011-09-17 ENCOUNTER — Other Ambulatory Visit (INDEPENDENT_AMBULATORY_CARE_PROVIDER_SITE_OTHER): Payer: Medicaid Other

## 2011-09-17 VITALS — BP 122/80 | HR 80 | Temp 98.2°F | Ht 66.0 in | Wt 271.6 lb

## 2011-09-17 DIAGNOSIS — E669 Obesity, unspecified: Secondary | ICD-10-CM

## 2011-09-17 DIAGNOSIS — J4520 Mild intermittent asthma, uncomplicated: Secondary | ICD-10-CM

## 2011-09-17 DIAGNOSIS — Z0000 Encounter for general adult medical examination without abnormal findings: Secondary | ICD-10-CM | POA: Insufficient documentation

## 2011-09-17 DIAGNOSIS — G4733 Obstructive sleep apnea (adult) (pediatric): Secondary | ICD-10-CM

## 2011-09-17 DIAGNOSIS — D869 Sarcoidosis, unspecified: Secondary | ICD-10-CM

## 2011-09-17 DIAGNOSIS — J45909 Unspecified asthma, uncomplicated: Secondary | ICD-10-CM

## 2011-09-17 LAB — CBC
HCT: 31.3 % — ABNORMAL LOW (ref 36.0–46.0)
MCHC: 31.9 g/dL (ref 30.0–36.0)
RDW: 14 % (ref 11.5–14.6)
WBC: 5 10*3/uL (ref 4.5–10.5)

## 2011-09-17 LAB — TSH: TSH: 1.63 u[IU]/mL (ref 0.35–5.50)

## 2011-09-17 MED ORDER — ALBUTEROL SULFATE HFA 108 (90 BASE) MCG/ACT IN AERS: 2.0000 | INHALATION_SPRAY | Freq: Four times a day (QID) | RESPIRATORY_TRACT | Status: DC | PRN

## 2011-09-17 NOTE — Progress Notes (Signed)
Chief Complaint  Patient presents with  . Follow-up    Pt c/o wheezing at times...no breathing changes.    History of Present Illness: Anna [name] is a 46 y.o. female with sarcoid with pulmonary and ocular invovlement, mild/intermittent asthma, and mild OSA.  She fell on her left side Monday.  She had CXR in ER.  She has been feeling tired, and her body hurts when she does activity.  She does get wheezing at times.  She has not tried using her inhaler.  She is not getting cough, sputum, hemoptysis, fever, sweats, gland swelling, leg swelling, or skin rash.   Past Medical History  Diagnosis Date  . Pulmonary sarcoidosis     and ocular.  biopsy 10/00, PFT 10/29/08 FEV1 2.24(77%), FVC 2.78(74%), TLC 3.72(68%), DLCO 61%, no BD response  . Vocal cord polyp     with hoarseness.  Dr. Altamese Cabal  . GERD (gastroesophageal reflux disease)   . OSA (obstructive sleep apnea)     PSG 11/03/05 RDI 6  . Obesity   . Mild intermittent asthma   . Rhinitis   . Glaucoma     No past surgical history on file.  No Known Allergies  Physical Exam:  Blood pressure 122/80, pulse 80, temperature 98.2 F (36.8 C), temperature source Oral, height 5\' 6"  (1.676 m), weight 271 lb 9.6 oz (123.197 kg), last menstrual period 08/28/2011, SpO2 93.00%. Body mass index is 43.84 kg/(m^2).  Wt Readings from Last 2 Encounters:  09/17/11 271 lb 9.6 oz (123.197 kg)  03/21/11 282 lb 6.4 oz (128.096 kg)     HEENT - no sinus tenderness, no oral exudate, no LAN  Chest - no wheeze/rales/dullness, normal respiratory excursion  Cardiac - s1s2 regular, no murmur  Abd - obese, soft  Ext - no e/c/c  Neuro - normal strength, CN intact  Psych - normal mood/behavior  Skin - no rashes   Dg Ribs Unilateral W/chest Left  09/14/2011  *RADIOLOGY REPORT*  Clinical Data: Larey Seat.  Left-sided pain.  LEFT RIBS AND CHEST - 3+ VIEW   Comparison: 11/30/2010 and previous   Findings: Heart is at the upper limits normal in size.   Chronic mediastinal and hilar lymphadenopathy remains evident.  Chronic 7 mm nodule the left upper lobe remains evident.  No evidence of pneumothorax or hemothorax.  No discernible rib fracture.   IMPRESSION: No acute bony finding.  Chronic hilar mediastinal lymphadenopathy.  Chronic 7 mm left upper lobe pulmonary nodule. Original Report Authenticated By: Thomasenia Sales, M.D.    Assessment/Plan:  Outpatient Encounter Prescriptions as of 09/17/2011  Medication Sig Dispense Refill  . acetaminophen (TYLENOL) 500 MG tablet Take 1,000 mg by mouth every 6 (six) hours as needed. For pain.      Marland Kitchen albuterol (PROAIR HFA) 108 (90 BASE) MCG/ACT inhaler Inhale 2 puffs into the lungs every 6 (six) hours as needed for wheezing.  1 Inhaler  5  . naproxen (NAPROSYN) 500 MG tablet Take 1 tablet (500 mg total) by mouth 2 (two) times daily.  30 tablet  0    Samyak Sackmann Pager:  [phone] 09/17/2011, 4:08 PM

## 2011-09-17 NOTE — Patient Instructions (Signed)
Lab tests today>>will call with results Proair two puffs as needed for cough, wheeze, or chest congestion Will arrange for primary care doctor Follow up in 3 to 4 months

## 2011-09-17 NOTE — Assessment & Plan Note (Signed)
Will arrange for referral to primary care. 

## 2011-09-17 NOTE — Assessment & Plan Note (Signed)
Explained that some of her symptoms may be related to progression of her sleep apnea.  She would like to monitor clinically for now.

## 2011-09-17 NOTE — Assessment & Plan Note (Signed)
She has not been using her albuterol.  Will refill this, and assess her response.

## 2011-09-17 NOTE — Assessment & Plan Note (Signed)
PFT from October 2012 and CXR from earlier this month did not show appreciable change.  Will check lab tests today.

## 2011-09-18 ENCOUNTER — Telehealth: Payer: Self-pay | Admitting: Pulmonary Disease

## 2011-09-18 LAB — COMPREHENSIVE METABOLIC PANEL
ALT: 19 U/L (ref 0–35)
Albumin: 3.3 g/dL — ABNORMAL LOW (ref 3.5–5.2)
Alkaline Phosphatase: 82 U/L (ref 39–117)
CO2: 26 mEq/L (ref 19–32)
Glucose, Bld: 82 mg/dL (ref 70–99)
Potassium: 3.9 mEq/L (ref 3.5–5.1)
Sodium: 136 mEq/L (ref 135–145)
Total Protein: 7.6 g/dL (ref 6.0–8.3)

## 2011-09-18 NOTE — Telephone Encounter (Signed)
Pt returned triage's call.  Advised pt that an Rx for Proair was sent to [facility] on 09/17/11.  Pt stated that nothing further is needed at this time.  Antionette Fairy

## 2011-09-18 NOTE — Telephone Encounter (Signed)
lmomtcb x1 for pt. rx for proair was sent 09/17/11 to [facility]

## 2011-09-19 NOTE — Assessment & Plan Note (Signed)
Explained that some of her symptoms could be related to her weight and deconditioning.

## 2011-09-25 ENCOUNTER — Telehealth: Payer: Self-pay | Admitting: Pulmonary Disease

## 2011-09-25 NOTE — Telephone Encounter (Signed)
CBC    Component Value Date/Time   WBC 5.0 09/17/2011 1651   RBC 3.94 09/17/2011 1651   HGB 10.0* 09/17/2011 1651   HCT 31.3* 09/17/2011 1651   PLT 208.0 09/17/2011 1651   MCV 79.6 09/17/2011 1651   MCHC 31.9 09/17/2011 1651   RDW 14.0 09/17/2011 1651   LYMPHSABS 0.9 11/30/2010 1324   MONOABS 0.4 11/30/2010 1324   EOSABS 0.2 11/30/2010 1324   BASOSABS 0.0 11/30/2010 1324    CMP     Component Value Date/Time   NA 136 09/17/2011 1651   K 3.9 09/17/2011 1651   CL 104 09/17/2011 1651   CO2 26 09/17/2011 1651   GLUCOSE 82 09/17/2011 1651   BUN 19 09/17/2011 1651   CREATININE 0.9 09/17/2011 1651   CALCIUM 8.8 09/17/2011 1651   PROT 7.6 09/17/2011 1651   ALBUMIN 3.3* 09/17/2011 1651   AST 26 09/17/2011 1651   ALT 19 09/17/2011 1651   ALKPHOS 82 09/17/2011 1651   BILITOT 0.1* 09/17/2011 1651   GFRNONAA 65 12/02/2007 1550   GFRAA 79 12/02/2007 1550    Lab Results  Component Value Date   ESRSEDRATE 52* 09/17/2011    Results reviewed with pt over phone.  Explained that she has anemia and non-specific elevation in ESR.  These values have been same for past several years.  She feels her breathing is getting better as she recovers more from recent fall with soreness to her right chest.  She is sleeping better also.  She notes mild rash on her legs now.  She is scheduled for evaluation with new PCP on March 29.  Advised her to discuss with her PCP.

## 2011-10-01 ENCOUNTER — Other Ambulatory Visit: Payer: Self-pay | Admitting: Internal Medicine

## 2011-10-01 DIAGNOSIS — Z1231 Encounter for screening mammogram for malignant neoplasm of breast: Secondary | ICD-10-CM

## 2011-10-17 ENCOUNTER — Ambulatory Visit: Payer: Medicaid Other

## 2011-12-17 ENCOUNTER — Ambulatory Visit: Payer: Medicaid Other | Admitting: Pulmonary Disease

## 2012-01-14 ENCOUNTER — Encounter: Payer: Self-pay | Admitting: Pulmonary Disease

## 2012-01-14 ENCOUNTER — Ambulatory Visit (INDEPENDENT_AMBULATORY_CARE_PROVIDER_SITE_OTHER): Payer: Medicaid Other | Admitting: Pulmonary Disease

## 2012-01-14 VITALS — BP 110/82 | HR 87 | Temp 98.2°F | Ht 66.0 in | Wt 292.6 lb

## 2012-01-14 DIAGNOSIS — D869 Sarcoidosis, unspecified: Secondary | ICD-10-CM

## 2012-01-14 DIAGNOSIS — J4520 Mild intermittent asthma, uncomplicated: Secondary | ICD-10-CM

## 2012-01-14 DIAGNOSIS — J45909 Unspecified asthma, uncomplicated: Secondary | ICD-10-CM

## 2012-01-14 DIAGNOSIS — Z6841 Body Mass Index (BMI) 40.0 and over, adult: Secondary | ICD-10-CM

## 2012-01-14 NOTE — Assessment & Plan Note (Signed)
Much of her current problems are likely related to her weight.  Discussed importance of diet, and exercise.  Discussed appropriate exercise regimen with cardiovascular and weight training, focusing on core muscle groups.  Will arrange for evaluation by dietician.

## 2012-01-14 NOTE — Patient Instructions (Signed)
Will arrange for nutritionist evaluation Follow up in 6 months

## 2012-01-14 NOTE — Assessment & Plan Note (Signed)
She is to continue prn albuterol.   

## 2012-01-14 NOTE — Assessment & Plan Note (Signed)
Stable.  Monitor clinically. 

## 2012-01-14 NOTE — Progress Notes (Signed)
Chief Complaint  Patient presents with  . Follow-up    Pt c/o slight wheezing. breathing has unchanged, nagging dry cough    History of Present Illness: Anna [name] is a 46 y.o. female with sarcoid with pulmonary and ocular invovlement, mild/intermittent asthma, and mild OSA.  She gets occasional wheeze, but not often.  She uses albuterol once per month.  She had some sinus congestion about one month ago.  With this she had more cough and congestion.  This is improving.    She is trying to exercise and change her diet.  She is not having any improvement in her weight.  She is due to follow up with [facility] for primary care.  She is thinking about moving to [place].   Past Medical History  Diagnosis Date  . Pulmonary sarcoidosis     and ocular.  biopsy 10/00, PFT 10/29/08 FEV1 2.24(77%), FVC 2.78(74%), TLC 3.72(68%), DLCO 61%, no BD response  . Vocal cord polyp     with hoarseness.  Dr. Altamese Cabal  . GERD (gastroesophageal reflux disease)   . OSA (obstructive sleep apnea)     PSG 11/03/05 RDI 6  . Obesity   . Mild intermittent asthma   . Rhinitis   . Glaucoma     No past surgical history on file.  No Known Allergies  Physical Exam:  Blood pressure 110/82, pulse 87, temperature 98.2 F (36.8 C), temperature source Oral, height 5\' 6"  (1.676 m), weight 292 lb 9.6 oz (132.722 kg), SpO2 97.00%. Body mass index is 47.23 kg/(m^2).  Wt Readings from Last 2 Encounters:  01/14/12 292 lb 9.6 oz (132.722 kg)  09/17/11 271 lb 9.6 oz (123.197 kg)     HEENT - no sinus tenderness, no oral exudate, no LAN  Chest - no wheeze/rales/dullness, normal respiratory excursion  Cardiac - s1s2 regular, no murmur  Abd - obese, soft  Ext - no e/c/c  Neuro - normal strength, CN intact  Psych - normal mood/behavior  Skin - no rashes   Assessment/Plan:  Outpatient Encounter Prescriptions as of 01/14/2012  Medication Sig Dispense Refill  . acetaminophen (TYLENOL) 500 MG  tablet Take 1,000 mg by mouth every 6 (six) hours as needed. For pain.      Marland Kitchen albuterol (PROAIR HFA) 108 (90 BASE) MCG/ACT inhaler Inhale 2 puffs into the lungs every 6 (six) hours as needed for wheezing.  1 Inhaler  5  . DISCONTD: naproxen (NAPROSYN) 500 MG tablet Take 1 tablet (500 mg total) by mouth 2 (two) times daily.  30 tablet  0    Anna [name] Pager:  [phone] 01/14/2012, 12:47 PM

## 2012-02-07 ENCOUNTER — Ambulatory Visit: Payer: Medicaid Other | Admitting: *Deleted

## 2012-02-07 ENCOUNTER — Ambulatory Visit: Payer: Medicaid Other

## 2012-02-08 ENCOUNTER — Encounter: Payer: Self-pay | Admitting: Pulmonary Disease

## 2012-02-11 ENCOUNTER — Ambulatory Visit: Payer: Medicaid Other

## 2012-02-11 ENCOUNTER — Other Ambulatory Visit: Payer: Self-pay | Admitting: Obstetrics and Gynecology

## 2012-02-27 ENCOUNTER — Ambulatory Visit: Payer: Medicaid Other | Admitting: *Deleted

## 2012-02-27 ENCOUNTER — Ambulatory Visit
Admission: RE | Admit: 2012-02-27 | Discharge: 2012-02-27 | Disposition: A | Discharge status: Home / Self Care | Payer: Medicaid Other | Source: Ambulatory Visit | Attending: Internal Medicine | Admitting: Internal Medicine

## 2012-02-27 DIAGNOSIS — Z1231 Encounter for screening mammogram for malignant neoplasm of breast: Secondary | ICD-10-CM

## 2012-03-20 ENCOUNTER — Ambulatory Visit: Payer: Medicaid Other | Admitting: *Deleted

## 2012-04-11 ENCOUNTER — Ambulatory Visit: Payer: Medicaid Other | Admitting: *Deleted

## 2012-06-30 ENCOUNTER — Telehealth: Payer: Self-pay | Admitting: Pulmonary Disease

## 2012-06-30 NOTE — Telephone Encounter (Signed)
Noted  

## 2012-06-30 NOTE — Telephone Encounter (Signed)
Spoke with pt She states calling to let VS know that if we receive any disability forms, to shred them She states that she does not have a case any longer and this is not needed Will forward to Mindy and VS so that they are aware

## 2012-08-27 ENCOUNTER — Ambulatory Visit: Payer: Medicaid Other | Admitting: Pulmonary Disease

## 2012-09-11 ENCOUNTER — Ambulatory Visit (INDEPENDENT_AMBULATORY_CARE_PROVIDER_SITE_OTHER): Payer: Medicaid Other | Admitting: Pulmonary Disease

## 2012-09-11 ENCOUNTER — Encounter: Payer: Self-pay | Admitting: Pulmonary Disease

## 2012-09-11 VITALS — BP 112/78 | HR 75 | Temp 97.0°F | Ht 66.0 in | Wt 295.0 lb

## 2012-09-11 DIAGNOSIS — D860 Sarcoidosis of lung: Secondary | ICD-10-CM

## 2012-09-11 DIAGNOSIS — J45909 Unspecified asthma, uncomplicated: Secondary | ICD-10-CM

## 2012-09-11 DIAGNOSIS — J4520 Mild intermittent asthma, uncomplicated: Secondary | ICD-10-CM

## 2012-09-11 NOTE — Assessment & Plan Note (Signed)
Clinically stable.  Will defer imaging and pulmonary function testing unless she develops progressive respiratory symptoms.

## 2012-09-11 NOTE — Patient Instructions (Signed)
Follow up in 1 year Call if you need assistance sooner

## 2012-09-11 NOTE — Progress Notes (Signed)
Chief Complaint  Patient presents with  . Follow-up    Pt states when she feels like she is wheezing she will use the albuterol and then she is fine. no cough, no chest tx.     History of Present Illness: Anna [name] is a 47 y.o. female with sarcoid with pulmonary and ocular invovlement, mild/intermittent asthma, and mild OSA.  Her breathing has been doing okay.  She gets occasional cough and wheeze.  She does not need to use albuterol often.  She is upset about inability to lose weight.  She continues to use eye drops for her glaucoma.  She denies fever, sputum, chest pain, or skin rashes.  She is working on getting a new PCP.  She is thinking about moving to [place].  She is still upset about the lose of her brother to sarcoidosis.  TESTS: October 2000 >> Lung Biopsy proven sarcoidosis  PSG 11/03/05 >> RDI 6 PFT 10/29/08 >> FEV1 2.24 (77%), FVC 2.78 (74%), TLC 3.72 (68%), DLCO 61%, no BD response PFT 04/12/11 >> FEV1 2.20(77%), FEV1% 79, TLC 4.41 (81%), DLCO 51%, no BD  Talar E Hast  has a past medical history of Pulmonary sarcoidosis; Vocal cord polyp; GERD (gastroesophageal reflux disease); OSA (obstructive sleep apnea); Obesity; Mild intermittent asthma; Rhinitis; and Glaucoma(365).  Pricilla E Hurrell  has no past surgical history on file.  Prior to Admission medications   Medication Sig Start Date End Date Taking? Authorizing Provider  albuterol (PROAIR HFA) 108 (90 BASE) MCG/ACT inhaler Inhale 2 puffs into the lungs every 6 (six) hours as needed for wheezing. 09/17/11 09/16/12 Yes [name], MD  Dorzolamide HCl-Timolol Mal PF 22.3-6.8 MG/ML SOLN Apply 1 drop to eye every 12 (twelve) hours.   Yes Historical Provider, MD    No Known Allergies   Physical Exam:  General - No distress ENT - No sinus tenderness, no oral exudate, no LAN Cardiac - s1s2 regular, no murmur Chest - No wheeze/rales/dullness Back - No focal tenderness Abd - Soft, non-tender Ext - No  edema Neuro - Normal strength Skin - No rashes Psych - normal mood, and behavior   Assessment/Plan:  [name], MD [place] [facility]/Sleep Pager:  [phone] 09/11/2012, 4:26 PM

## 2012-09-11 NOTE — Assessment & Plan Note (Signed)
She is to continue prn albuterol.

## 2012-09-22 ENCOUNTER — Telehealth: Payer: Self-pay | Admitting: Pulmonary Disease

## 2012-09-22 DIAGNOSIS — J4520 Mild intermittent asthma, uncomplicated: Secondary | ICD-10-CM

## 2012-09-22 MED ORDER — ALBUTEROL SULFATE HFA 108 (90 BASE) MCG/ACT IN AERS: 2.0000 | INHALATION_SPRAY | Freq: Four times a day (QID) | RESPIRATORY_TRACT | Status: DC | PRN

## 2012-09-22 NOTE — Telephone Encounter (Signed)
Spoke with patient, patient requesting refill on albuterol inhaler sent to [facility]. Rx sent and nothing further needed at this time.

## 2012-11-26 ENCOUNTER — Telehealth: Payer: Self-pay | Admitting: Pulmonary Disease

## 2012-11-26 NOTE — Telephone Encounter (Signed)
Called spoke with patient who believes she is having a sarcoid flare with lung discomfort, wheezing, dry cough x2-3 weeks.  Rates her wheezing is a 4 out of 10 and cough is 2 out of 10.  Reports the wheezing nor the pain are severe but is requesting an appt this week.  Last ov w/ VS was 3.13.14.  VS nor any physician with openings this week.  Ov w/ TP scheduled for Friday 5.30.14 @ 1030 (pt requested this date/time) but pt will call to see if there are any openings tomorrow afternoon.  Pt to call if her symptoms worsen prior to ov. Will close and forward to VS as FYI of work-in appt

## 2012-11-27 ENCOUNTER — Ambulatory Visit: Payer: Medicaid Other | Admitting: Adult Health

## 2012-11-28 ENCOUNTER — Encounter: Payer: Self-pay | Admitting: Pulmonary Disease

## 2012-11-28 ENCOUNTER — Ambulatory Visit (INDEPENDENT_AMBULATORY_CARE_PROVIDER_SITE_OTHER)
Admission: RE | Admit: 2012-11-28 | Discharge: 2012-11-28 | Disposition: A | Discharge status: Home / Self Care | Payer: Medicaid Other | Source: Ambulatory Visit | Attending: Pulmonary Disease | Admitting: Pulmonary Disease

## 2012-11-28 ENCOUNTER — Ambulatory Visit: Payer: Medicaid Other | Admitting: Adult Health

## 2012-11-28 ENCOUNTER — Ambulatory Visit (INDEPENDENT_AMBULATORY_CARE_PROVIDER_SITE_OTHER): Payer: Medicaid Other | Admitting: Pulmonary Disease

## 2012-11-28 ENCOUNTER — Telehealth: Payer: Self-pay | Admitting: Pulmonary Disease

## 2012-11-28 VITALS — BP 126/84 | HR 74 | Temp 97.9°F | Ht 66.0 in | Wt 305.0 lb

## 2012-11-28 DIAGNOSIS — D860 Sarcoidosis of lung: Secondary | ICD-10-CM

## 2012-11-28 DIAGNOSIS — J45901 Unspecified asthma with (acute) exacerbation: Secondary | ICD-10-CM

## 2012-11-28 DIAGNOSIS — D869 Sarcoidosis, unspecified: Secondary | ICD-10-CM

## 2012-11-28 DIAGNOSIS — J99 Respiratory disorders in diseases classified elsewhere: Secondary | ICD-10-CM

## 2012-11-28 DIAGNOSIS — J4521 Mild intermittent asthma with (acute) exacerbation: Secondary | ICD-10-CM

## 2012-11-28 MED ORDER — MOMETASONE FURO-FORMOTEROL FUM 100-5 MCG/ACT IN AERO: 2.0000 | INHALATION_SPRAY | Freq: Two times a day (BID) | RESPIRATORY_TRACT | Status: DC

## 2012-11-28 NOTE — Assessment & Plan Note (Signed)
Will repeat chest xray today and call with results.  Defer resumption of prednisone for now.

## 2012-11-28 NOTE — Telephone Encounter (Addendum)
Dg Chest 2 View  11/28/2012   *RADIOLOGY REPORT*  Clinical Data: History of sarcoidosis.  Shortness of breath.  CHEST - 2 VIEW  Comparison: Single view of the chest 09/14/2011 and PA and lateral chest 11/30/2010.  Findings: Thickening of the right paratracheal stripe compatible with lymphadenopathy is unchanged.  Nodular opacity projecting in the left upper lobe measuring 0.6 cm is stable.  Heart size is normal.  No pneumothorax or pleural fluid.  IMPRESSION:  1. No acute disease. 2.  Lymphadenopathy compatible with history sarcoidosis. 3.  Unchanged left upper lobe pulmonary nodule.   Original Report Authenticated By: Holley Dexter, M.D.   Will have my nurse inform patient that CXR looks okay.  No evidence that sarcoid has progressed.  She is to continue trial of inhalers for asthma as cause of recent symptoms.

## 2012-11-28 NOTE — Patient Instructions (Signed)
Dulera two puffs twice daily, and rinse mouth after each use >> call after sample finished if you feel you want a refill Chest xray today >> will call with results Follow up in 6 months

## 2012-11-28 NOTE — Telephone Encounter (Signed)
I spoke with patient about results and she verbalized understanding and had no questions 

## 2012-11-28 NOTE — Progress Notes (Signed)
Chief Complaint  Patient presents with  . Sarcoidosis    Breathing has been getting worse. Reports chest pain, chest tightness, SOB and wheezing,    History of Present Illness: Anna [name] is a 47 y.o. female with sarcoid with pulmonary and ocular invovlement, mild/intermittent asthma, and mild OSA.  She has progressive dyspnea, cough, and wheeze for the past 3 weeks.  She is not bringing up sputum.  She denies fever or sore throat.  She has noticed more dry skin.  She uses her albuterol more frequently, and this helps.  TESTS: October 2000 >> Lung Biopsy proven sarcoidosis  PSG 11/03/05 >> RDI 6 PFT 10/29/08 >> FEV1 2.24 (77%), FVC 2.78 (74%), TLC 3.72 (68%), DLCO 61%, no BD response PFT 04/12/11 >> FEV1 2.20(77%), FEV1% 79, TLC 4.41 (81%), DLCO 51%, no BD  Anna [name]  has a past medical history of Pulmonary sarcoidosis; Vocal cord polyp; GERD (gastroesophageal reflux disease); OSA (obstructive sleep apnea); Obesity; Mild intermittent asthma; Rhinitis; and Glaucoma.  Anna [name]  has no past surgical history on file.  Prior to Admission medications   Medication Sig Start Date End Date Taking? Authorizing Provider  albuterol (PROAIR HFA) 108 (90 BASE) MCG/ACT inhaler Inhale 2 puffs into the lungs every 6 (six) hours as needed for wheezing. 09/17/11 09/16/12 Yes [name], MD  Dorzolamide HCl-Timolol Mal PF 22.3-6.8 MG/ML SOLN Apply 1 drop to eye every 12 (twelve) hours.   Yes Historical Provider, MD    No Known Allergies   Physical Exam:  General - No distress ENT - No sinus tenderness, no oral exudate, no LAN Cardiac - s1s2 regular, no murmur Chest - No wheeze/rales/dullness Back - No focal tenderness Abd - Soft, non-tender Ext - No edema Neuro - Normal strength Skin - No rashes Psych - normal mood, and behavior   Assessment/Plan:  [name], MD [place] [facility]/Sleep Pager:  [phone] 11/28/2012, 10:35 AM

## 2012-11-28 NOTE — Assessment & Plan Note (Signed)
Her current symptoms are suggestive of worsening asthma.  Will try her on dulera, and continue prn albuterol.  Based on response will determine if she needs to continue dulera and if she needs additional pulmonary function testing.

## 2013-02-12 ENCOUNTER — Other Ambulatory Visit: Payer: Self-pay

## 2013-02-12 DIAGNOSIS — Z1231 Encounter for screening mammogram for malignant neoplasm of breast: Secondary | ICD-10-CM

## 2013-02-13 ENCOUNTER — Telehealth: Payer: Self-pay | Admitting: Pulmonary Disease

## 2013-02-13 NOTE — Telephone Encounter (Signed)
Spoke to pt. She has some results from her PCP that she wants to discuss with VS. Was very determined to be seen ASAP. Appointment at the end of September was offered and that was unacceptable. She has been scheduled on 02/27/2013 at 3. Advised her that there may be a wait since his schedule is booked, she agreed and verbalized understanding.

## 2013-02-27 ENCOUNTER — Ambulatory Visit (INDEPENDENT_AMBULATORY_CARE_PROVIDER_SITE_OTHER): Payer: Medicaid Other | Admitting: Pulmonary Disease

## 2013-02-27 ENCOUNTER — Encounter: Payer: Self-pay | Admitting: Pulmonary Disease

## 2013-02-27 VITALS — BP 120/90 | HR 86 | Ht 66.0 in | Wt 304.0 lb

## 2013-02-27 DIAGNOSIS — J45909 Unspecified asthma, uncomplicated: Secondary | ICD-10-CM

## 2013-02-27 DIAGNOSIS — D860 Sarcoidosis of lung: Secondary | ICD-10-CM

## 2013-02-27 DIAGNOSIS — J4520 Mild intermittent asthma, uncomplicated: Secondary | ICD-10-CM

## 2013-02-27 DIAGNOSIS — J99 Respiratory disorders in diseases classified elsewhere: Secondary | ICD-10-CM

## 2013-02-27 DIAGNOSIS — D869 Sarcoidosis, unspecified: Secondary | ICD-10-CM

## 2013-02-27 NOTE — Patient Instructions (Signed)
Follow up in 6 months 

## 2013-02-27 NOTE — Assessment & Plan Note (Signed)
Continue inhaler therapy 

## 2013-02-27 NOTE — Progress Notes (Signed)
Chief Complaint  Patient presents with  . Follow-up    Pt states that she has some results from her PCP that she wants VS to go over.     History of Present Illness: Anna [name] is a 47 y.o. female with sarcoid with pulmonary and ocular invovlement, mild/intermittent asthma, and mild OSA.  She was recently seen by new PCP.  She had lab work done.  These included lipid profile and CRP.  She was told her CRP was elevated, and that she had inflammation throughout body.  This made her worried about whether her sarcoidosis has flared up again.  She denies cough, wheeze, sputum, fever, sweats, chest pain, skin rashes, joint swelling, or gland swelling.  She has been very anxious since her about her lab test results.    She has been started on aspirin and vitamin D.   TESTS: October 2000 >> Lung Biopsy proven sarcoidosis  PSG 11/03/05 >> RDI 6 PFT 10/29/08 >> FEV1 2.24 (77%), FVC 2.78 (74%), TLC 3.72 (68%), DLCO 61%, no BD response PFT 04/12/11 >> FEV1 2.20(77%), FEV1% 79, TLC 4.41 (81%), DLCO 51%, no BD  Anna [name]  has a past medical history of Pulmonary sarcoidosis; Vocal cord polyp; GERD (gastroesophageal reflux disease); OSA (obstructive sleep apnea); Obesity; Mild intermittent asthma; Rhinitis; and Glaucoma.  Anna [name]  has no past surgical history on file.  Prior to Admission medications   Medication Sig Start Date End Date Taking? Authorizing Provider  albuterol (PROAIR HFA) 108 (90 BASE) MCG/ACT inhaler Inhale 2 puffs into the lungs every 6 (six) hours as needed for wheezing. 09/17/11 09/16/12 Yes [name], MD  Dorzolamide HCl-Timolol Mal PF 22.3-6.8 MG/ML SOLN Apply 1 drop to eye every 12 (twelve) hours.   Yes Historical Provider, MD    No Known Allergies   Physical Exam:  General - No distress ENT - No sinus tenderness, no oral exudate, no LAN Cardiac - s1s2 regular, no murmur Chest - No wheeze/rales/dullness Back - No focal tenderness Abd - Soft,  non-tender Ext - No edema Neuro - Normal strength Skin - No rashes Psych - normal mood, and behavior   Assessment/Plan:  [name], MD [place] [facility]/Critical [facility]/Sleep Pager:  [phone] 02/27/2013, 5:50 PM

## 2013-02-27 NOTE — Assessment & Plan Note (Signed)
I explained to her CRP elevation is non-specific, and does not necessarily reflect recurrence of sarcoidosis.  She is not having symptoms at present to suggest flare of sarcoidosis.  Recommend she continue therapy as prescribed by her PCP.  She is to call when she gets follow up lab results by PCP.  Will then determine if she needs additional testing to further assess status of sarcoidosis >> this would include PFT and CT chest with contrast.

## 2013-03-05 ENCOUNTER — Ambulatory Visit: Payer: Medicaid Other

## 2013-03-19 ENCOUNTER — Ambulatory Visit
Admission: RE | Admit: 2013-03-19 | Discharge: 2013-03-19 | Disposition: A | Discharge status: Home / Self Care | Payer: Medicaid Other | Source: Ambulatory Visit

## 2013-03-19 DIAGNOSIS — Z1231 Encounter for screening mammogram for malignant neoplasm of breast: Secondary | ICD-10-CM

## 2013-04-08 ENCOUNTER — Ambulatory Visit: Payer: Medicaid Other | Admitting: Cardiology

## 2013-04-21 ENCOUNTER — Emergency Department (HOSPITAL_COMMUNITY): Payer: Medicaid Other

## 2013-04-21 ENCOUNTER — Emergency Department (HOSPITAL_COMMUNITY)
Admission: EM | Admit: 2013-04-21 | Discharge: 2013-04-22 | Disposition: A | Discharge status: Home / Self Care | Payer: Medicaid Other | Attending: Emergency Medicine | Admitting: Emergency Medicine

## 2013-04-21 ENCOUNTER — Encounter (HOSPITAL_COMMUNITY): Payer: Self-pay | Admitting: Emergency Medicine

## 2013-04-21 DIAGNOSIS — Z8619 Personal history of other infectious and parasitic diseases: Secondary | ICD-10-CM | POA: Insufficient documentation

## 2013-04-21 DIAGNOSIS — R072 Precordial pain: Secondary | ICD-10-CM | POA: Insufficient documentation

## 2013-04-21 DIAGNOSIS — Z8719 Personal history of other diseases of the digestive system: Secondary | ICD-10-CM | POA: Insufficient documentation

## 2013-04-21 DIAGNOSIS — Z7982 Long term (current) use of aspirin: Secondary | ICD-10-CM | POA: Insufficient documentation

## 2013-04-21 DIAGNOSIS — Z87891 Personal history of nicotine dependence: Secondary | ICD-10-CM | POA: Insufficient documentation

## 2013-04-21 DIAGNOSIS — J45909 Unspecified asthma, uncomplicated: Secondary | ICD-10-CM | POA: Insufficient documentation

## 2013-04-21 DIAGNOSIS — G8929 Other chronic pain: Secondary | ICD-10-CM

## 2013-04-21 DIAGNOSIS — E669 Obesity, unspecified: Secondary | ICD-10-CM | POA: Insufficient documentation

## 2013-04-21 DIAGNOSIS — H409 Unspecified glaucoma: Secondary | ICD-10-CM | POA: Insufficient documentation

## 2013-04-21 DIAGNOSIS — Z79899 Other long term (current) drug therapy: Secondary | ICD-10-CM | POA: Insufficient documentation

## 2013-04-21 LAB — COMPREHENSIVE METABOLIC PANEL
ALT: 14 U/L (ref 0–35)
AST: 21 U/L (ref 0–37)
CO2: 24 mEq/L (ref 19–32)
Calcium: 9.6 mg/dL (ref 8.4–10.5)
GFR calc non Af Amer: 52 mL/min — ABNORMAL LOW (ref >90)
Sodium: 132 mEq/L — ABNORMAL LOW (ref 135–145)
Total Protein: 7.8 g/dL (ref 6.0–8.3)

## 2013-04-21 LAB — CBC WITH DIFFERENTIAL/PLATELET
Basophils Absolute: 0 10*3/uL (ref 0.0–0.1)
Eosinophils Relative: 4 % (ref 0–5)
HCT: 33.8 % — ABNORMAL LOW (ref 36.0–46.0)
Hemoglobin: 11.3 g/dL — ABNORMAL LOW (ref 12.0–15.0)
Lymphocytes Relative: 24 % (ref 12–46)
MCH: 27.2 pg (ref 26.0–34.0)
MCHC: 33.4 g/dL (ref 30.0–36.0)
RBC: 4.16 MIL/uL (ref 3.87–5.11)

## 2013-04-21 MED ORDER — OXYCODONE HCL 5 MG PO TABS
5.0000 mg | ORAL_TABLET | ORAL | Status: AC
  Administered 2013-04-21: 5 mg via ORAL
  Filled 2013-04-21: qty 1

## 2013-04-21 NOTE — ED Provider Notes (Signed)
CSN: [id number]     Arrival date & time 04/21/13  2135 History   First MD Initiated Contact with Patient 04/21/13 2145     Chief Complaint  Patient presents with  . Chest Pain   (Consider location/radiation/quality/duration/timing/severity/associated sxs/prior Treatment) Patient is a 47 y.o. female presenting with chest pain. The history is provided by the patient.  Chest Pain Pain location:  L chest, R chest and substernal area Pain quality comment:  Pulling sensation Pain radiates to:  Upper back Pain radiates to the back: yes   Pain severity:  Mild Onset quality:  Gradual Duration:  6 months Timing:  Constant Progression:  Unchanged Chronicity:  Chronic Context: at rest   Context: not breathing   Relieved by:  Nothing Worsened by:  Nothing tried Ineffective treatments: ibuprofen. Associated symptoms: no abdominal pain, no back pain, no cough, no dizziness, no fatigue, no fever, no headache, no nausea, no orthopnea, no shortness of breath and not vomiting     Past Medical History  Diagnosis Date  . Pulmonary sarcoidosis     and ocular.  biopsy 10/00, PFT 10/29/08 FEV1 2.24(77%), FVC 2.78(74%), TLC 3.72(68%), DLCO 61%, no BD response  . Vocal cord polyp     with hoarseness.  Dr. Altamese Cabal  . GERD (gastroesophageal reflux disease)   . OSA (obstructive sleep apnea)     PSG 11/03/05 RDI 6  . Obesity   . Mild intermittent asthma   . Rhinitis   . Glaucoma    History reviewed. No pertinent past surgical history. History reviewed. No pertinent family history. History  Substance Use Topics  . Smoking status: Former Smoker -- 1.50 packs/day for 10 years    Types: Cigarettes    Quit date: 07/02/1997  . Smokeless tobacco: Never Used  . Alcohol Use: No   OB History   Grav Para Term Preterm Abortions TAB SAB Ect Mult Living                 Review of Systems  Constitutional: Negative for fever and fatigue.  HENT: Negative for congestion and drooling.   Eyes: Negative  for pain.  Respiratory: Negative for cough and shortness of breath.   Cardiovascular: Positive for chest pain. Negative for orthopnea.  Gastrointestinal: Negative for nausea, vomiting, abdominal pain and diarrhea.  Genitourinary: Negative for dysuria and hematuria.  Musculoskeletal: Negative for back pain, gait problem and neck pain.  Skin: Negative for color change.  Neurological: Negative for dizziness and headaches.  Hematological: Negative for adenopathy.  Psychiatric/Behavioral: Negative for behavioral problems.  All other systems reviewed and are negative.    Allergies  Review of patient's allergies indicates no known allergies.  Home Medications   Current Outpatient Rx  Name  Route  Sig  Dispense  Refill  . albuterol (PROAIR HFA) 108 (90 BASE) MCG/ACT inhaler   Inhalation   Inhale 2 puffs into the lungs every 6 (six) hours as needed for wheezing.   1 Inhaler   5   . aspirin 81 MG tablet   Oral   Take 81 mg by mouth daily.         . cholecalciferol (VITAMIN D) 1000 UNITS tablet   Oral   Take 1,000 Units by mouth daily.         . Dorzolamide HCl-Timolol Mal PF 22.3-6.8 MG/ML SOLN   Ophthalmic   Apply 1 drop to eye every 12 (twelve) hours.         . mometasone-formoterol (DULERA) 100-5 MCG/ACT AERO  Inhalation   Inhale 2 puffs into the lungs 2 (two) times daily.   1 Inhaler   5    BP 157/88  Pulse 91  Temp(Src) 98.2 F (36.8 C) (Oral)  Resp 20  SpO2 97% Physical Exam  Nursing note and vitals reviewed. Constitutional: She is oriented to person, place, and time. She appears well-developed and well-nourished.  obese  HENT:  Head: Normocephalic.  Mouth/Throat: Oropharynx is clear and moist. No oropharyngeal exudate.  Eyes: Conjunctivae and EOM are normal. Pupils are equal, round, and reactive to light.  Neck: Normal range of motion. Neck supple.  Cardiovascular: Normal rate, regular rhythm, normal heart sounds and intact distal pulses.  Exam reveals  no gallop and no friction rub.   No murmur heard. Pulmonary/Chest: Effort normal and breath sounds normal. No respiratory distress. She has no wheezes.  Abdominal: Soft. Bowel sounds are normal. There is no tenderness. There is no rebound and no guarding.  Musculoskeletal: Normal range of motion. She exhibits no edema and no tenderness.  Normal appearing LE's. No focal ttp of her LE's. 2+ distal pulses.   Neurological: She is alert and oriented to person, place, and time.  Skin: Skin is warm and dry.  Psychiatric: She has a normal mood and affect. Her behavior is normal.    ED Course  Procedures (including critical care time) Labs Review Labs Reviewed  CBC WITH DIFFERENTIAL - Abnormal; Notable for the following:    Hemoglobin 11.3 (*)    HCT 33.8 (*)    All other components within normal limits  COMPREHENSIVE METABOLIC PANEL - Abnormal; Notable for the following:    Sodium 132 (*)    Creatinine, Ser 1.24 (*)    Albumin 3.3 (*)    Total Bilirubin 0.2 (*)    GFR calc non Af Amer 52 (*)    GFR calc Af Amer 60 (*)    All other components within normal limits  TROPONIN I   Imaging Review Dg Chest 2 View  04/21/2013   CLINICAL DATA:  Chest pain.  EXAM: CHEST  2 VIEW  COMPARISON:  Chest radiograph may 30th 2014.  FINDINGS: Cardiac silhouette is upper limits of normal, similar to prior examination. Mild central pulmonary vasculature congestion, improved. Two subcentimeter nodular densities project in left upper lobe are unchanged. No pleural effusions or focal consolidations. No pneumothorax.  Multiple EKG lines overlie the patient and may obscure subtle underlying pathology. Soft tissue planes and included osseous structures are nonsuspicious.  IMPRESSION: Borderline cardiomegaly, decreased pulmonary vasculature congestion.  Stable appearance of 2 left upper lobe sub cm pulmonary nodules.   Electronically Signed   By: Awilda Metro   On: 04/21/2013 22:55    EKG Interpretation      Ventricular Rate:  83 PR Interval:  212 QRS Duration: 94 QT Interval:  380 QTC Calculation: 447 R Axis:   -7 Text Interpretation:  Normal sinus rhythm with 1st degree A-V block ECG OTHERWISE WITHIN NORMAL LIMITS            MDM   1. Chronic chest pain    10:09 PM 47 y.o. female who presents with constant chest pain for 6-7 months. She also notes constant mild shortness of breath. The patient does have a history of sarcoidosis. She was diagnosed with costochondritis several weeks ago but denies any relief with 800 mg of ibuprofen 3 times a day. She is afebrile and vital signs are unremarkable here.Well's/Perc negative. Atypical for cardiac and pt has few RF's (  FH, previous smoker). Will get screening labs, CXR. Will give 5mg  oxycodone.   12:06 AM: I interpreted/reviewed the labs and/or imaging which were non-contributory.  CP now gone. Low risk for MACE per HEART score. The pt states she has an appt with a heart specialist next week. Will provide stronger pain medicine until then. I suspect her sx may be associated w/ her sarcoidosis.  I have discussed the diagnosis/risks/treatment options with the patient and believe the pt to be eligible for discharge home to follow-up with heart specialist next week as scheduled. We also discussed returning to the ED immediately if new or worsening sx occur. We discussed the sx which are most concerning (e.g., worsening pain, sob, fever) that necessitate immediate return. Any new prescriptions provided to the patient are listed below.  Discharge Medication List as of 04/22/2013 12:11 AM    START taking these medications   Details  oxyCODONE-acetaminophen (PERCOCET) 5-325 MG per tablet Take 1 tablet by mouth every 6 (six) hours as needed for pain., Starting 04/22/2013, Until Discontinued, Print         Junius Argyle, MD 04/22/13 1155

## 2013-04-21 NOTE — ED Notes (Signed)
Pt presents to ED with c/o central chest pain with shortness of breath, onset months ago; pt reports "the worst tonight".

## 2013-04-22 MED ORDER — OXYCODONE-ACETAMINOPHEN 5-325 MG PO TABS: 1.0000 | ORAL_TABLET | Freq: Four times a day (QID) | ORAL | Status: DC | PRN

## 2013-04-30 ENCOUNTER — Ambulatory Visit: Payer: Medicaid Other | Attending: Cardiology | Admitting: Internal Medicine

## 2013-04-30 ENCOUNTER — Encounter: Payer: Self-pay | Admitting: Internal Medicine

## 2013-04-30 VITALS — BP 138/88 | HR 80 | Temp 98.8°F | Resp 16 | Ht 66.0 in | Wt 294.0 lb

## 2013-04-30 DIAGNOSIS — G4733 Obstructive sleep apnea (adult) (pediatric): Secondary | ICD-10-CM

## 2013-04-30 DIAGNOSIS — R079 Chest pain, unspecified: Secondary | ICD-10-CM | POA: Insufficient documentation

## 2013-04-30 DIAGNOSIS — J4520 Mild intermittent asthma, uncomplicated: Secondary | ICD-10-CM

## 2013-04-30 DIAGNOSIS — D860 Sarcoidosis of lung: Secondary | ICD-10-CM

## 2013-04-30 LAB — LIPID PANEL
HDL: 42 mg/dL (ref >39)
LDL Cholesterol: 138 mg/dL — ABNORMAL HIGH (ref 0–99)
Triglycerides: 84 mg/dL (ref <150)
VLDL: 17 mg/dL (ref 0–40)

## 2013-04-30 MED ORDER — TRAMADOL HCL 50 MG PO TABS: 50.0000 mg | ORAL_TABLET | Freq: Three times a day (TID) | ORAL | Status: DC | PRN

## 2013-04-30 MED ORDER — MELOXICAM 7.5 MG PO TABS: 7.5000 mg | ORAL_TABLET | Freq: Every day | ORAL | Status: DC

## 2013-04-30 MED ORDER — PREDNISONE (PAK) 10 MG PO TABS: 10.0000 mg | ORAL_TABLET | Freq: Every day | ORAL | Status: DC

## 2013-04-30 NOTE — Progress Notes (Unsigned)
Patient ID: Anna [name], female   DOB: [birth year], 47 y.o.   MRN: [id number] Patient Demographics  Anna [name], is a 47 y.o. female  MVH:[id number]  [id number]:[id number]  DOB - 1966/06/10  Chief Complaint  Patient presents with  . Establish Care        Subjective:   Anna [name] today is here to establish primary care.  Patient is a 47 year old female with morbid obesity, OSA, probably sarcoidosis, chronic chest pains, follows Dr. Coralyn [name] for sarcoidosis. The patient reports that she was in the hospital ER because of chest pain.  She reports that the chest pain has been going on since April 2014, EKG in the ER was negative for any acute ischemic changes, no prior cardiac workup. Patient reports that she was seen by Dr. Jackquline [name] and was told that she has costochondritis. She reports that she came here to see the cardiologist and did not understand why she needed to see the primary care physician. Patient states that " i just need someone to take care of me". She also reports that she has OCD and asked me "if this is a public clinic".  Patient has No headache, No chest pain, No abdominal pain - No Nausea, No new weakness tingling or numbness, No Cough - SOB.   Objective:    Filed Vitals:   04/30/13 1035  BP: 138/88  Pulse: 80  Temp: 98.8 F (37.1 C)  TempSrc: Oral  Resp: 16  Height: 5\' 6"  (1.676 m)  Weight: 294 lb (133.358 kg)  SpO2: 97%     ALLERGIES:  No Known Allergies  PAST MEDICAL HISTORY: Past Medical History  Diagnosis Date  . Pulmonary sarcoidosis     and ocular.  biopsy 10/00, PFT 10/29/08 FEV1 2.24(77%), FVC 2.78(74%), TLC 3.72(68%), DLCO 61%, no BD response  . Vocal cord polyp     with hoarseness.  Dr. Altamese [name]  . GERD (gastroesophageal reflux disease)   . OSA (obstructive sleep apnea)     PSG 11/03/05 RDI 6  . Obesity   . Mild intermittent asthma   . Rhinitis   . Glaucoma     PAST SURGICAL HISTORY: History reviewed. No pertinent past surgical  history.  FAMILY HISTORY: History reviewed. No pertinent family history.  MEDICATIONS AT HOME: Prior to Admission medications   Medication Sig Start Date End Date Taking? Authorizing Provider  albuterol (PROAIR HFA) 108 (90 BASE) MCG/ACT inhaler Inhale 2 puffs into the lungs every 6 (six) hours as needed for wheezing. 09/22/12 09/22/13  Anna Shan, MD  ibuprofen (ADVIL,MOTRIN) 200 MG tablet Take 800 mg by mouth every 6 (six) hours as needed for pain (pain).    Historical Provider, MD  meloxicam (MOBIC) 7.5 MG tablet Take 1 tablet (7.5 mg total) by mouth daily. 04/30/13   Anna Cristiano Jenna Luo, MD  oxyCODONE-acetaminophen (PERCOCET) 5-325 MG per tablet Take 1 tablet by mouth every 6 (six) hours as needed for pain. 04/22/13   Anna Argyle, MD  predniSONE (STERAPRED UNI-PAK) 10 MG tablet Take 1 tablet (10 mg total) by mouth daily. Take as directed on the pack 04/30/13   Anna [name] K Anna Wolanski, MD  traMADol (ULTRAM) 50 MG tablet Take 1 tablet (50 mg total) by mouth every 8 (eight) hours as needed for pain. 04/30/13   Anna [name] Jenna Luo, MD    REVIEW OF SYSTEMS:  Constitutional:   No   Fevers, chills, fatigue.  HEENT:    No headaches, Sore throat,   Cardio-vascular: No chest pain,  Orthopnea, swelling in lower extremities, anasarca, palpitations  GI:  No abdominal pain, nausea, vomiting, diarrhea  Resp: No shortness of breath,  No coughing up of blood.No cough.No wheezing.  Skin:  no rash or lesions.  GU:  no dysuria, change in color of urine, no urgency or frequency.  No flank pain.  Musculoskeletal: No joint pain or swelling.  No decreased range of motion.  No back pain.  Psych: No change in mood or affect. No depression or anxiety.  No memory loss.   Exam  General appearance :Awake, alert, NAD, Speech Clear. HEENT: Atraumatic and Normocephalic, PERLA Neck: supple, no JVD. No cervical lymphadenopathy.  Chest: clear to auscultation bilaterally, no wheezing, rales or rhonchi,  chest wall tenderness to palpation CVS: S1 S2 regular, no murmurs.  Abdomen: soft, NBS, NT, ND, no gaurding, rigidity or rebound. Extremities: No cyanosis, clubbing, B/L Lower Ext shows no edema,  Neurology: Awake alert, and oriented X 3, CN II-XII intact, Non focal Skin:No Rash or lesions Wounds: N/A    Data Review   Basic Metabolic Panel: No results found for this basename: NA, K, CL, CO2, GLUCOSE, BUN, CREATININE, CALCIUM, MG, PHOS,  in the last 168 hours Liver Function Tests: No results found for this basename: AST, ALT, ALKPHOS, BILITOT, PROT, ALBUMIN,  in the last 168 hours  CBC: No results found for this basename: WBC, NEUTROABS, HGB, HCT, MCV, PLT,  in the last 168 hours ------------------------------------------------------------------------------------------------------------------ No results found for this basename: HGBA1C,  in the last 72 hours ------------------------------------------------------------------------------------------------------------------ No results found for this basename: CHOL, HDL, LDLCALC, TRIG, CHOLHDL, LDLDIRECT,  in the last 72 hours ------------------------------------------------------------------------------------------------------------------ No results found for this basename: TSH, T4TOTAL, FREET3, T3FREE, THYROIDAB,  in the last 72 hours ------------------------------------------------------------------------------------------------------------------ No results found for this basename: VITAMINB12, FOLATE, FERRITIN, TIBC, IRON, RETICCTPCT,  in the last 72 hours  Coagulation profile  No results found for this basename: INR, PROTIME,  in the last 168 hours    Assessment & Plan   Active Problems: Chronic intermittent chest pains: She does have chest wall tenderness to palpation, although she never had any cardiac workup in the past. She reports no GERD or esophagitis symptoms. Her chest pain episodes was not associated with nausea, SOB,  palpitations, dizziness, or any radiation. - will try prednisone pack, meloxicam low dose 7.5mg , tramadol as needed. Stressed to the patient to take prilosec OTC if she develops any esophagitis. - Talked to Noreene Larsson, RN to schedule pt appointment with Dr Daleen Squibb next week, patient had no prior cardiac evaluation, will defer to cards for stress test vs ECHO  Labs reviewed from 10/21 Patient declined flu shot, lipid panel ordered  - Mammogram was done in September 2014, normal   Follow-up in 3 months   Cohen Doleman M.D. 04/30/2013, 10:54 AM

## 2013-04-30 NOTE — Progress Notes (Unsigned)
Pt is here today to establish care. Pt reports that she was admitted to the hospital with chest pain.  All test came back normal. Today she is feeling no pain but she is conscious and can feel that the tightness is there.

## 2013-05-06 ENCOUNTER — Ambulatory Visit: Payer: Medicaid Other | Admitting: Cardiology

## 2013-05-06 ENCOUNTER — Telehealth: Payer: Self-pay | Admitting: Internal Medicine

## 2013-05-06 NOTE — Telephone Encounter (Signed)
Called pt to let her know that she has missed her appointment with Dr. Daleen Squibb today, and if she would like to reschedule her appointment.  Pt did not answer so I left a voicemail to return our call

## 2013-05-07 ENCOUNTER — Telehealth: Payer: Self-pay | Admitting: Internal Medicine

## 2013-05-07 DIAGNOSIS — D869 Sarcoidosis, unspecified: Secondary | ICD-10-CM

## 2013-05-07 NOTE — Telephone Encounter (Signed)
Patient called in for her lab results Labs viewed by Dr Hyman Hopes and patient is aware Low fat  Diet and exercise Recheck in three months

## 2013-05-07 NOTE — Telephone Encounter (Signed)
Pt calling about lab results. Please f/u with pt.  °

## 2013-05-27 ENCOUNTER — Ambulatory Visit: Payer: Medicaid Other | Admitting: Cardiology

## 2013-06-03 ENCOUNTER — Ambulatory Visit: Payer: Medicaid Other | Attending: Cardiology | Admitting: Cardiology

## 2013-06-03 ENCOUNTER — Encounter: Payer: Self-pay | Admitting: Cardiology

## 2013-06-03 ENCOUNTER — Ambulatory Visit: Payer: Medicaid Other | Admitting: Cardiology

## 2013-06-03 VITALS — BP 98/61 | HR 94 | Temp 98.6°F | Resp 18 | Ht 66.0 in | Wt 294.0 lb

## 2013-06-03 DIAGNOSIS — R079 Chest pain, unspecified: Secondary | ICD-10-CM | POA: Insufficient documentation

## 2013-06-03 MED ORDER — MELOXICAM 7.5 MG PO TABS: 15.0000 mg | ORAL_TABLET | Freq: Every day | ORAL | Status: DC

## 2013-06-03 NOTE — Assessment & Plan Note (Signed)
By history and exam this is clearly costochondritis. I discontinued her aspirin. She does not want to take prednisone nor do I think is indicated. I placed her on Mobic 15 mg daily for one month. She will avoid activity that worsens the pain. I've given her one refill for the future. I reassured her this was not cardiac in no cardiac workup should be initiated.

## 2013-06-03 NOTE — Progress Notes (Signed)
Pt referred to Dr. Daleen Squibb today for chronic CP that started in April 2014.pt was worked up in ER with negative report. Continues sternal chest pain radiating to both arms achy,dull intemit. Pt was seen by Pulmonologist for Sarcoidosis

## 2013-06-03 NOTE — Progress Notes (Signed)
HPI Ms. Anna [name] is referred today from the emergency room for followup of chest pain. She was evaluated there recently. Please review the report. EKG at that time was normal.  The discomfort is constant and has been since April. It is made worse by twisting her upper body or using her arms. It is not made worse by ambulation. She has pulmonary sarcoid but has not been coughing a lot. She denies any injury or trauma. She also takes her left upper arm which is constant as well.  She was prescribed prednisone for this in our clinic thinking this was an exacerbation of her sarcoid. She was also prescribed an anti-inflammatory. A pharmacist outside the clinic did not fill the Mobic for fear of interaction with prednisone and aspirin. They did not check with [place]. Note that the prednisone resolved  the pain completely.  Her risk factors for coronary disease or hyperlipidemia and obesity. She does not smoke any further.  Past Medical History  Diagnosis Date  . Pulmonary sarcoidosis     and ocular.  biopsy 10/00, PFT 10/29/08 FEV1 2.24(77%), FVC 2.78(74%), TLC 3.72(68%), DLCO 61%, no BD response  . Vocal cord polyp     with hoarseness.  Dr. Altamese Cabal  . GERD (gastroesophageal reflux disease)   . OSA (obstructive sleep apnea)     PSG 11/03/05 RDI 6  . Obesity   . Mild intermittent asthma   . Rhinitis   . Glaucoma     Current Outpatient Prescriptions  Medication Sig Dispense Refill  . albuterol (PROAIR HFA) 108 (90 BASE) MCG/ACT inhaler Inhale 2 puffs into the lungs every 6 (six) hours as needed for wheezing.  1 Inhaler  5  . ibuprofen (ADVIL,MOTRIN) 200 MG tablet Take 800 mg by mouth every 6 (six) hours as needed for pain (pain).      . meloxicam (MOBIC) 7.5 MG tablet Take 2 tablets (15 mg total) by mouth daily.  30 tablet  1  . oxyCODONE-acetaminophen (PERCOCET) 5-325 MG per tablet Take 1 tablet by mouth every 6 (six) hours as needed for pain.  20 tablet  0  . predniSONE (STERAPRED UNI-PAK) 10 MG  tablet Take 1 tablet (10 mg total) by mouth daily. Take as directed on the pack  21 tablet  0  . traMADol (ULTRAM) 50 MG tablet Take 1 tablet (50 mg total) by mouth every 8 (eight) hours as needed for pain.  30 tablet  0   No current facility-administered medications for this visit.    No Known Allergies  History reviewed. No pertinent family history.  History   Social History  . Marital Status: Divorced    Spouse Name: N/A    Number of Children: N/A  . Years of Education: N/A   Occupational History  . Not on file.   Social History Main Topics  . Smoking status: Former Smoker -- 1.50 packs/day for 10 years    Types: Cigarettes    Quit date: 07/02/1997  . Smokeless tobacco: Never Used  . Alcohol Use: No  . Drug Use: Not on file  . Sexual Activity: Not on file   Other Topics Concern  . Not on file   Social History Narrative  . No narrative on file    ROS ALL NEGATIVE EXCEPT THOSE NOTED IN HPI  PE  General Appearance: well developed, well nourished in no acute distress, obese HEENT: symmetrical face, PERRLA, good dentition  Neck: no JVD, thyromegaly, or adenopathy, trachea midline Chest: symmetric without deformity, exquisitely  tender first and second intercostal joints bilaterally. Cardiac: PMI non-displaced, RRR, normal S1, S2, no gallop or murmur Lung: clear to ausculation and percussion Vascular: all pulses full without bruits  Abdominal: nondistended, nontender, good bowel sounds, Extremities: no cyanosis, clubbing or edema, no sign of DVT, no varicosities  Skin: normal color, no rashes Neuro: alert and oriented x 3, non-focal Pysch: normal affect  EKG Not repeated BMET    Component Value Date/Time   NA 132* 04/21/2013 2150   K 3.7 04/21/2013 2150   CL 100 04/21/2013 2150   CO2 24 04/21/2013 2150   GLUCOSE 92 04/21/2013 2150   BUN 19 04/21/2013 2150   CREATININE 1.24* 04/21/2013 2150   CALCIUM 9.6 04/21/2013 2150   GFRNONAA 52* 04/21/2013 2150    GFRAA 60* 04/21/2013 2150    Lipid Panel     Component Value Date/Time   CHOL 197 04/30/2013 1109   TRIG 84 04/30/2013 1109   HDL 42 04/30/2013 1109   CHOLHDL 4.7 04/30/2013 1109   VLDL 17 04/30/2013 1109   LDLCALC 138* 04/30/2013 1109    CBC    Component Value Date/Time   WBC 6.9 04/21/2013 2150   RBC 4.16 04/21/2013 2150   HGB 11.3* 04/21/2013 2150   HCT 33.8* 04/21/2013 2150   PLT 230 04/21/2013 2150   MCV 81.3 04/21/2013 2150   MCH 27.2 04/21/2013 2150   MCHC 33.4 04/21/2013 2150   RDW 13.0 04/21/2013 2150   LYMPHSABS 1.7 04/21/2013 2150   MONOABS 0.5 04/21/2013 2150   EOSABS 0.3 04/21/2013 2150   BASOSABS 0.0 04/21/2013 2150

## 2013-06-08 ENCOUNTER — Telehealth: Payer: Self-pay | Admitting: Internal Medicine

## 2013-06-08 NOTE — Telephone Encounter (Signed)
Pt says that she is not feeling any relief with prescribed med, meloxicam (MOBIC) 7.5 MG tablet.  Pt would like to speak with nurse about what to do.

## 2013-06-09 ENCOUNTER — Ambulatory Visit: Payer: Medicaid Other

## 2013-06-09 ENCOUNTER — Telehealth: Payer: Self-pay | Admitting: Emergency Medicine

## 2013-06-09 NOTE — Telephone Encounter (Signed)
Spoke with pt regarding worsening chest wall pain with prescribed Meloxicam. States medication not working. States Motrin 800 mg tab worked in the past for pain. Also reports of left upper arm sore pain.Pt has hx. Referred pt over to [facility] for appt same day

## 2013-06-10 ENCOUNTER — Ambulatory Visit: Payer: Medicaid Other | Admitting: Cardiology

## 2013-06-10 ENCOUNTER — Ambulatory Visit: Payer: Medicaid Other | Attending: Cardiology | Admitting: Internal Medicine

## 2013-06-10 ENCOUNTER — Encounter: Payer: Self-pay | Admitting: Internal Medicine

## 2013-06-10 VITALS — BP 171/98 | HR 80 | Temp 98.7°F | Resp 14 | Ht 66.0 in | Wt 300.2 lb

## 2013-06-10 DIAGNOSIS — R071 Chest pain on breathing: Secondary | ICD-10-CM | POA: Insufficient documentation

## 2013-06-10 DIAGNOSIS — R079 Chest pain, unspecified: Secondary | ICD-10-CM

## 2013-06-10 MED ORDER — DULOXETINE HCL 30 MG PO CPEP: 30.0000 mg | ORAL_CAPSULE | Freq: Every day | ORAL | Status: DC

## 2013-06-10 NOTE — Progress Notes (Signed)
Patient ID: Anna [name], female   DOB: 14-Jul-1965, 47 y.o.   MRN: [id number]   CC:  HPI: 47 year old female who is here for followup of her chest pain. The patient is extremely frustrated with her care. The patient has seen multiple primary care providers and her cardiologist and feels that nobody is getting to the bottom of why she has the pain. She also complains of pain radiating from her left chest wall into her left shoulder and into her right shoulder. 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 She has already tried prednisone twice, meloxicam and escalating doses, none of it has worked Microbiologist makes her sleepy. She has not filled her tramadol prescription She denies any history of trauma. She has been diagnosed with costochondritis since August. She states that the only thing that ever worked for her was ibuprofen 800 mg 3 times a day. The patient has not been taking Prilosec as advised because she feels that she has not had any GI symptoms   No Known Allergies Past Medical History  Diagnosis Date  . Pulmonary sarcoidosis     and ocular.  biopsy 10/00, PFT 10/29/08 FEV1 2.24(77%), FVC 2.78(74%), TLC 3.72(68%), DLCO 61%, no BD response  . Vocal cord polyp     with hoarseness.  Dr. Altamese Cabal  . GERD (gastroesophageal reflux disease)   . OSA (obstructive sleep apnea)     PSG 11/03/05 RDI 6  . Obesity   . Mild intermittent asthma   . Rhinitis   . Glaucoma    Current Outpatient Prescriptions on File Prior to Visit  Medication Sig Dispense Refill  . albuterol (PROAIR HFA) 108 (90 BASE) MCG/ACT inhaler Inhale 2 puffs into the lungs every 6 (six) hours as needed for wheezing.  1 Inhaler  5  . meloxicam (MOBIC) 7.5 MG tablet Take 2 tablets (15 mg total) by mouth daily.  30 tablet  1  . ibuprofen (ADVIL,MOTRIN) 200 MG tablet Take 800 mg by mouth every 6 (six) hours as needed for pain (pain).      Marland Kitchen oxyCODONE-acetaminophen (PERCOCET) 5-325 MG per tablet Take 1  tablet by mouth every 6 (six) hours as needed for pain.  20 tablet  0   No current facility-administered medications on file prior to visit.   No family history on file. History   Social History  . Marital Status: Divorced    Spouse Name: N/A    Number of Children: N/A  . Years of Education: N/A   Occupational History  . Not on file.   Social History Main Topics  . Smoking status: Former Smoker -- 1.50 packs/day for 10 years    Types: Cigarettes    Quit date: 07/02/1997  . Smokeless tobacco: Never Used  . Alcohol Use: No  . Drug Use: Not on file  . Sexual Activity: Not on file   Other Topics Concern  . Not on file   Social History Narrative  . No narrative on file    Review of Systems  Constitutional: Negative for fever, chills, diaphoresis, activity change, appetite change and fatigue.  HENT: Negative for ear pain, nosebleeds, congestion, facial swelling, rhinorrhea, neck pain, neck stiffness and ear discharge.   Eyes: Negative for pain, discharge, redness, itching and visual disturbance.  Respiratory: Negative for cough, choking, chest tightness, shortness of breath, wheezing and stridor.   Cardiovascular: Negative for chest pain, palpitations and leg swelling.  Gastrointestinal: Negative for abdominal distention.  Genitourinary:  Negative for dysuria, urgency, frequency, hematuria, flank pain, decreased urine volume, difficulty urinating and dyspareunia.  Musculoskeletal: Negative for back pain, joint swelling, arthralgias and gait problem.  Neurological: Negative for dizziness, tremors, seizures, syncope, facial asymmetry, speech difficulty, weakness, light-headedness, numbness and headaches.  Hematological: Negative for adenopathy. Does not bruise/bleed easily.  Psychiatric/Behavioral: Negative for hallucinations, behavioral problems, confusion, dysphoric mood, decreased concentration and agitation.    Objective:   Filed Vitals:   06/10/13 1500  BP: 171/98   Pulse: 80  Temp: 98.7 F (37.1 C)  Resp: 14    Physical Exam  Constitutional: Appears well-developed and well-nourished. No distress.  HENT: Normocephalic. External right and left ear normal. Oropharynx is clear and moist.  Eyes: Conjunctivae and EOM are normal. PERRLA, no scleral icterus.  Neck: Normal ROM. Neck supple. No JVD. No tracheal deviation. No thyromegaly.  CVS: RRR, S1/S2 +, no murmurs, no gallops, no carotid bruit.  Pulmonary: Effort and breath sounds normal, no stridor, rhonchi, wheezes, rales.  Abdominal: Soft. BS +,  no distension, tenderness, rebound or guarding.  Musculoskeletal: Normal range of motion. No edema and no tenderness.  Lymphadenopathy: No lymphadenopathy noted, cervical, inguinal. Neuro: Alert. Normal reflexes, muscle tone coordination. No cranial nerve deficit. Skin: Skin is warm and dry. No rash noted. Not diaphoretic. No erythema. No pallor.  Psychiatric: Normal mood and affect. Behavior, judgment, thought content normal.   Lab Results  Component Value Date   WBC 6.9 04/21/2013   HGB 11.3* 04/21/2013   HCT 33.8* 04/21/2013   MCV 81.3 04/21/2013   PLT 230 04/21/2013   Lab Results  Component Value Date   CREATININE 1.24* 04/21/2013   BUN 19 04/21/2013   NA 132* 04/21/2013   K 3.7 04/21/2013   CL 100 04/21/2013   CO2 24 04/21/2013    Lab Results  Component Value Date   HGBA1C 5.7 11/30/2010   Lipid Panel     Component Value Date/Time   CHOL 197 04/30/2013 1109   TRIG 84 04/30/2013 1109   HDL 42 04/30/2013 1109   CHOLHDL 4.7 04/30/2013 1109   VLDL 17 04/30/2013 1109   LDLCALC 138* 04/30/2013 1109       Assessment and plan:   Patient Active Problem List   Diagnosis Date Noted  . Severe obesity (BMI >= 40) 06/03/2013  . Chest pain at rest 04/30/2013  . Morbid obesity with BMI of 45.0-49.9, adult 08/01/2009  . Mild intermittent asthma 08/23/2008  . Pulmonary sarcoidosis 07/16/2007  . OBSTRUCTIVE SLEEP APNEA 07/16/2007    Chest wall pain, diagnosed with costochondritis thus far Saw Dr. wall on 12/3 Prescribed meloxicam which is not working Patient wants to get to the bottom of this pain Acting extremely difficult and frustrated Initially refusing to try Lyrica or any other antidepressant for the pain, refusing imaging studies After much discussion she is requesting a CT scan and a 2-D echo Also agreeable to try Cymbalta 30 mg a day We'll see her back in one week        The patient was given clear instructions to go to ER or return to medical center if symptoms don't improve, worsen or new problems develop. The patient verbalized understanding. The patient was told to call to get any lab results if not heard anything in the next week.

## 2013-06-10 NOTE — Progress Notes (Signed)
Pt is here to see physician concerning the medication for pain. Medication is not working.

## 2013-06-15 ENCOUNTER — Telehealth: Payer: Self-pay | Admitting: Pulmonary Disease

## 2013-06-15 ENCOUNTER — Telehealth: Payer: Self-pay | Admitting: Internal Medicine

## 2013-06-15 ENCOUNTER — Ambulatory Visit (HOSPITAL_COMMUNITY)
Admission: RE | Admit: 2013-06-15 | Discharge: 2013-06-15 | Disposition: A | Discharge status: Home / Self Care | Payer: Medicaid Other | Source: Ambulatory Visit | Attending: Internal Medicine | Admitting: Internal Medicine

## 2013-06-15 DIAGNOSIS — M79609 Pain in unspecified limb: Secondary | ICD-10-CM | POA: Insufficient documentation

## 2013-06-15 DIAGNOSIS — R079 Chest pain, unspecified: Secondary | ICD-10-CM | POA: Insufficient documentation

## 2013-06-15 DIAGNOSIS — D869 Sarcoidosis, unspecified: Secondary | ICD-10-CM | POA: Insufficient documentation

## 2013-06-15 DIAGNOSIS — Q333 Agenesis of lung: Secondary | ICD-10-CM | POA: Insufficient documentation

## 2013-06-15 MED ORDER — IOHEXOL 350 MG/ML SOLN
100.0000 mL | Freq: Once | INTRAVENOUS | Status: AC | PRN
  Administered 2013-06-15: 100 mL via INTRAVENOUS

## 2013-06-15 NOTE — Telephone Encounter (Signed)
Pt had CT imaging done on 06/15/13 and she received a call from a pulmonary Dr to schedule appt.  Pt is upset that her PCP did not call her firt and discuss her CT results before referring her to Pulmonologist. Please f/u with pt soon.

## 2013-06-15 NOTE — Telephone Encounter (Signed)
Spoke with the pt  She states that she wants [facility] to look at the CT Chest that was done today  She is concerned having a sarcoid flare She is requesting asap appt with [facility] only  Nothing open, please advise when we can work her in thanks

## 2013-06-17 MED ORDER — PREDNISONE 10 MG PO TABS: ORAL_TABLET | ORAL | Status: DC

## 2013-06-17 NOTE — Telephone Encounter (Signed)
06/15/2013    CLINICAL DATA:  Left upper chest pain and left arm pain. History of sarcoid. Evaluate for pulmonary embolus.   EXAM: CT ANGIOGRAPHY CHEST WITH CONTRAST   TECHNIQUE: Multidetector CT imaging of the chest was performed using the standard protocol during bolus administration of intravenous contrast. Multiplanar CT image reconstructions including MIPs were obtained to evaluate the vascular anatomy.   CONTRAST:  OMNIPAQUE IOHEXOL 350 MG/ML SOLN   COMPARISON:  12/17/2007.   FINDINGS:  Evaluation of the segmental and subsegmental pulmonary arteries is limited due to body habitus and respiratory motion. No central or lobar pulmonary embolus. Mediastinal lymph nodes measure up to 2.4 cm in the low left paratracheal station. Subcarinal lymph node measures 2.5 cm. Bi hilar adenopathy, right greater than left. Findings are unchanged from 12/17/2007. Heart is mildly enlarged. Focal area of apical thinning in the left ventricle. No pericardial effusion. Pre pericardiac lymph node is sub cm in size.  Image quality is degraded by respiratory motion and body habitus. Scattered pulmonary nodules measure 5 mm or less in size, unchanged and therefore benign. Progressive volume loss in the right middle lobe with obstruction of the right middle lobe bronchus. No pleural fluid. Airway is otherwise unremarkable.  Incidental imaging of the upper abdomen shows no acute findings. No worrisome lytic or sclerotic lesions.  Review of the MIP images confirms the above findings.   IMPRESSION:  1. Evaluation of the segmental and subsegmental pulmonary arteries is limited by body habitus and respiratory motion. No central or lobar pulmonary embolus.  2. Mediastinal and hilar adenopathy, consistent with the given history of sarcoid.  3. New obstruction of the right middle lobe bronchus with progressive volume loss in the right middle lobe. Findings may be due to compressive adenopathy. Difficult to definitively exclude  a centrally obstructing mass.    Spoke with pt over phone.  She is well known to me.    She has developed cough, and chest discomfort.  She denies fever, hemoptysis, or weight loss.  She had a rash on her neck several weeks ago, that resolved without therapy.  I reviewed CT chest findings with pt.  Options discussed were: 1) trial of prednisone with office follow up in few weeks, and then repeat CT chest, 2) proceed with bronchoscopy +/- EBUS, 3) monitor off therapy and f/u CT chest in several weeks.  Decision is to proceed with trial of prednisone.  Will have her use 30 mg prednisone daily for one week, and then decrease dose by 5 mg per week until she gets to 10 mg daily.  She will then stay on 10 mg daily until she has follow up with me in February 2015.  Will then plan to repeat CT chest with contrast.  Will have my nurse call to schedule ROV for first week in February 2015.

## 2013-06-18 MED ORDER — PREDNISONE 10 MG PO TABS: ORAL_TABLET | ORAL | Status: DC

## 2013-06-18 NOTE — Telephone Encounter (Signed)
rx was sent to [facility]. [facility] cancelled rx [facility]. Pt is aware. Carron Curie, CMA

## 2013-06-18 NOTE — Telephone Encounter (Signed)
Pt calling following up on her rx for prednisone 30mg  wants to know the status of this can be reached at [phone].//[facility].Raylene Everts

## 2013-06-22 ENCOUNTER — Telehealth: Payer: Self-pay | Admitting: Pulmonary Disease

## 2013-06-22 NOTE — Telephone Encounter (Signed)
Mindy spoke to the pateint for almost 10 minutes. I could hear her conversation and the pt was being very rude and demanding to speak to VS about her CT. Per the documentation in her chart, Dr. Craige Cotta already went over these results with her. Our office didn't even order the CT, her PCP did. Mindy made her aware that Dr. Craige Cotta would not be back in the office until 07/06/13. She was offered an appointment to come in to discuss her CT and she refused. After several minutes, Mindy asked if I would take the call because she could no longer speak to the pt.  When I took over the call, the pt was also very rude and had an attitude with me about wanting to speak with a MD about her CT. She was upset that no one told her that Dr. Craige Cotta would not be back in the office until after the first of the year when she left the first message. I apologized to the pt but that didn't seem to make a difference to her. Advised pt that I would send this request to speak to a MD about her CT to the doctor of the day.   Crystal is aware that I am going to be sending this message to PW for a response.  PW - please advise. Thanks.

## 2013-06-22 NOTE — Telephone Encounter (Signed)
I spoke to the pt , she wants appt with dr Craige Cotta early January to regroup after being on prednisone

## 2013-06-22 NOTE — Telephone Encounter (Signed)
Pt wants to be seen the first of February. ROV has been made for 08/05/13 at 3pm.

## 2013-06-23 ENCOUNTER — Ambulatory Visit (HOSPITAL_COMMUNITY)
Admission: RE | Admit: 2013-06-23 | Discharge: 2013-06-23 | Disposition: A | Discharge status: Home / Self Care | Payer: Medicaid Other | Source: Ambulatory Visit | Attending: Internal Medicine | Admitting: Internal Medicine

## 2013-06-23 ENCOUNTER — Encounter (INDEPENDENT_AMBULATORY_CARE_PROVIDER_SITE_OTHER): Payer: Self-pay

## 2013-06-23 DIAGNOSIS — R0989 Other specified symptoms and signs involving the circulatory and respiratory systems: Secondary | ICD-10-CM | POA: Insufficient documentation

## 2013-06-23 DIAGNOSIS — R0609 Other forms of dyspnea: Secondary | ICD-10-CM | POA: Insufficient documentation

## 2013-06-23 DIAGNOSIS — I059 Rheumatic mitral valve disease, unspecified: Secondary | ICD-10-CM

## 2013-06-23 DIAGNOSIS — Z87891 Personal history of nicotine dependence: Secondary | ICD-10-CM | POA: Insufficient documentation

## 2013-06-23 DIAGNOSIS — R079 Chest pain, unspecified: Secondary | ICD-10-CM | POA: Insufficient documentation

## 2013-06-23 NOTE — Progress Notes (Signed)
  Echocardiogram 2D Echocardiogram has been performed.  Gail Creekmore FRANCES 06/23/2013, 3:20 PM

## 2013-07-13 ENCOUNTER — Telehealth: Payer: Self-pay | Admitting: Internal Medicine

## 2013-07-13 NOTE — Telephone Encounter (Signed)
Patient called for results of echo.  Please review and advise.

## 2013-07-13 NOTE — Telephone Encounter (Signed)
Pt says she had a cardiogram done on 12/23 and has not received a call with results. Please f/u with pt.

## 2013-08-04 ENCOUNTER — Ambulatory Visit: Payer: Medicaid Other | Admitting: Internal Medicine

## 2013-08-05 ENCOUNTER — Encounter: Payer: Self-pay | Admitting: Pulmonary Disease

## 2013-08-05 ENCOUNTER — Ambulatory Visit (INDEPENDENT_AMBULATORY_CARE_PROVIDER_SITE_OTHER): Payer: Medicaid Other | Admitting: Pulmonary Disease

## 2013-08-05 VITALS — BP 130/84 | HR 80 | Ht 66.0 in | Wt 303.0 lb

## 2013-08-05 DIAGNOSIS — J45909 Unspecified asthma, uncomplicated: Secondary | ICD-10-CM

## 2013-08-05 DIAGNOSIS — J4520 Mild intermittent asthma, uncomplicated: Secondary | ICD-10-CM

## 2013-08-05 DIAGNOSIS — J99 Respiratory disorders in diseases classified elsewhere: Secondary | ICD-10-CM

## 2013-08-05 DIAGNOSIS — D869 Sarcoidosis, unspecified: Secondary | ICD-10-CM

## 2013-08-05 DIAGNOSIS — J9819 Other pulmonary collapse: Secondary | ICD-10-CM

## 2013-08-05 DIAGNOSIS — D860 Sarcoidosis of lung: Secondary | ICD-10-CM

## 2013-08-05 NOTE — Assessment & Plan Note (Signed)
Will continue prednisone 10 mg daily for now.  Will repeat CT chest with contrast >> depending on results will decide if she needs to continue prednisone therapy or if she needs bronchoscopy to assess for Rt middle lobe syndrome.

## 2013-08-05 NOTE — Progress Notes (Signed)
Chief Complaint  Patient presents with  . Sarcoidosis    Breathing is unchanged. Reports SOB, dry cough. Still has pain in her chest.    History of Present Illness: Anna [name] is a 48 y.o. female with sarcoid with pulmonary and ocular invovlement, mild/intermittent asthma, and mild OSA.  She remains on 10 mg prednisone.  She still has chest pain.  This is a gnawing feeling from her right side and right mid back >> difference in pain with prednisone.  She has occasional cough with clear sputum >> mostly in the morning.  She denies wheeze, fever, sweats, skin rashes, joint swelling, or gland swelling.   TESTS: October 2000 >> Lung Biopsy proven sarcoidosis  PSG 11/03/05 >> RDI 6 PFT 10/29/08 >> FEV1 2.24 (77%), FVC 2.78 (74%), TLC 3.72 (68%), DLCO 61%, no BD response PFT 04/12/11 >> FEV1 2.20(77%), FEV1% 79, TLC 4.41 (81%), DLCO 51%, no BD CT chest 06/15/13 >> 2.4 cm Lt paratracheal node, 2.5 cm subcarinal node, b/l hilar LAN, scattered < 5 mm nodules b/l, volume loss RML with obstruction/compression of RML bronchus  Anna [name]  has a past medical history of Pulmonary sarcoidosis; Vocal cord polyp; GERD (gastroesophageal reflux disease); OSA (obstructive sleep apnea); Obesity; Mild intermittent asthma; Rhinitis; and Glaucoma.  Anna [name]  has no past surgical history on file.  Prior to Admission medications   Medication Sig Start Date End Date Taking? Authorizing Provider  albuterol (PROAIR HFA) 108 (90 BASE) MCG/ACT inhaler Inhale 2 puffs into the lungs every 6 (six) hours as needed for wheezing. 09/17/11 09/16/12 Yes Coralyn HellingVineet Wiley Magan, MD  Dorzolamide HCl-Timolol Mal PF 22.3-6.8 MG/ML SOLN Apply 1 drop to eye every 12 (twelve) hours.   Yes Historical Provider, MD    No Known Allergies   Physical Exam:  General - No distress ENT - No sinus tenderness, no oral exudate, no LAN Cardiac - s1s2 regular, no murmur Chest - No wheeze/rales/dullness Back - No focal  tenderness Abd - Soft, non-tender Ext - No edema Neuro - Normal strength Skin - No rashes Psych - normal mood, and behavior   Assessment/Plan:  Coralyn HellingVineet Boubacar Lerette, MD [facility] Pager:  [phone] 08/05/2013, 2:49 PM

## 2013-08-05 NOTE — Assessment & Plan Note (Signed)
Continue prn albuterol

## 2013-08-05 NOTE — Patient Instructions (Signed)
Will arrange for CT chest >> will call with results and discuss plans for prednisone Follow up in 2 months

## 2013-08-12 ENCOUNTER — Ambulatory Visit (HOSPITAL_COMMUNITY)
Admission: RE | Admit: 2013-08-12 | Discharge: 2013-08-12 | Disposition: A | Discharge status: Home / Self Care | Payer: Medicaid Other | Source: Ambulatory Visit | Attending: Pulmonary Disease | Admitting: Pulmonary Disease

## 2013-08-12 DIAGNOSIS — D869 Sarcoidosis, unspecified: Secondary | ICD-10-CM | POA: Insufficient documentation

## 2013-08-12 DIAGNOSIS — J9819 Other pulmonary collapse: Secondary | ICD-10-CM | POA: Insufficient documentation

## 2013-08-12 DIAGNOSIS — R599 Enlarged lymph nodes, unspecified: Secondary | ICD-10-CM | POA: Insufficient documentation

## 2013-08-12 DIAGNOSIS — M899 Disorder of bone, unspecified: Secondary | ICD-10-CM | POA: Insufficient documentation

## 2013-08-12 DIAGNOSIS — J984 Other disorders of lung: Secondary | ICD-10-CM | POA: Insufficient documentation

## 2013-08-12 DIAGNOSIS — D860 Sarcoidosis of lung: Secondary | ICD-10-CM

## 2013-08-12 DIAGNOSIS — M949 Disorder of cartilage, unspecified: Secondary | ICD-10-CM

## 2013-08-12 MED ORDER — IOHEXOL 300 MG/ML  SOLN
100.0000 mL | Freq: Once | INTRAMUSCULAR | Status: AC | PRN
  Administered 2013-08-12: 100 mL via INTRAVENOUS

## 2013-08-13 ENCOUNTER — Telehealth: Payer: Self-pay | Admitting: Emergency Medicine

## 2013-08-13 ENCOUNTER — Telehealth: Payer: Self-pay | Admitting: Pulmonary Disease

## 2013-08-13 ENCOUNTER — Telehealth: Payer: Self-pay | Admitting: Internal Medicine

## 2013-08-13 ENCOUNTER — Ambulatory Visit: Payer: Medicaid Other | Attending: Internal Medicine | Admitting: Internal Medicine

## 2013-08-13 ENCOUNTER — Encounter: Payer: Self-pay | Admitting: Internal Medicine

## 2013-08-13 VITALS — BP 135/87 | HR 84 | Temp 98.2°F | Resp 16 | Ht 66.0 in | Wt 300.0 lb

## 2013-08-13 DIAGNOSIS — D869 Sarcoidosis, unspecified: Secondary | ICD-10-CM | POA: Insufficient documentation

## 2013-08-13 DIAGNOSIS — I253 Aneurysm of heart: Secondary | ICD-10-CM | POA: Insufficient documentation

## 2013-08-13 DIAGNOSIS — R079 Chest pain, unspecified: Secondary | ICD-10-CM | POA: Insufficient documentation

## 2013-08-13 DIAGNOSIS — Z6841 Body Mass Index (BMI) 40.0 and over, adult: Secondary | ICD-10-CM

## 2013-08-13 DIAGNOSIS — R931 Abnormal findings on diagnostic imaging of heart and coronary circulation: Secondary | ICD-10-CM | POA: Insufficient documentation

## 2013-08-13 NOTE — Telephone Encounter (Signed)
Pt came today for OV. Given Echo report. Appt scheduled with Dr. Daleen SquibbWall 08/26/13

## 2013-08-13 NOTE — Telephone Encounter (Signed)
Pt is calling to speak with Noreene LarssonJill about some lab results; [phone]

## 2013-08-13 NOTE — Telephone Encounter (Signed)
Called, spoke with pt.  She had CT Chest done on 08/12/13.  Reports VS advised he would call her with results.  Pt requesting these -- advised VS is off this evening and would be in office tomorrow pm.  Pt ok with call then.  Dr. Craige CottaSood, pls advise.  Thank you.

## 2013-08-13 NOTE — Patient Instructions (Signed)
Sarcoidosis, Schaumann's Disease, Sarcoid of [place]  Sarcoidosis appears briefly and heals naturally in 60 to 70 percent of cases, often without the patient knowing or doing anything about it. 20 to 30 percent of patients with sarcoidosis are left with some permanent lung damage. In 10 to 15 percent of the patients, sarcoidosis can become chronic (long lasting). When either the granulomas or fibrosis seriously affect the function of a vital organ (lungs, heart, nervous system, liver, or kidneys), sarcoidosis can be fatal. This occurs 5 to 10 percent of the time. No one can predict how sarcoidosis will progress in an individual patient. The symptoms the patient experiences, the caregiver's findings, and the patient's race can give some clues.  Sarcoidosis was once considered a rare disease. We now know that it is a common chronic illness that appears all over the world. It is the most common of the fibrotic (scarring) lung disorders. Anyone can get sarcoidosis. It occurs in all races and in both sexes. The risk is greater if you are a young black adult, especially a black woman, or are of Scandinavian, German, Irish, or Puerto [place] origin.  In sarcoidosis, small lumps (also called nodules or granulomas) develop in multiple organs of the body. These granulomas are small collections of inflamed cells. They commonly appear in the lungs. This is the most common organ affected. They also occur in the lymph nodes (your glands), skin, liver, and eyes. The granulomas vary in the amount of disease they produce from very little with no problems (symptoms) to causing severe illness. The cause of sarcoidosis is not known. It may be due to an abnormal immune reaction in the body. Most people will recover. A few people will develop long lasting conditions that may get worse. Women are affected more often than men. The majority of those affected are under forty years of age. Because we do not know the cause, we do not have ways to  prevent it.  SYMPTOMS   · Fever.  · Loss of appetite.  · Night sweats.  · Joint pain.  · Aching muscles  Symptoms vary because the disease affects different parts of the body in different people. Most people who see their caregiver with sarcoidosis have lung problems. The first signs are usually a dry cough and shortness of breath. There may also be wheezing, chest pain, or a cough that brings up bloody mucus. In severe cases, lung function may become so poor that the person cannot perform even the simple routine tasks of daily life.  Other symptoms of sarcoidosis are less common than lung symptoms. They can include:  · Skin symptoms. Sarcoidosis can appear as a collection of tender, red bumps called erythema nodosum. These bumps usually occur on the face, shins, and arms. They can also occur as a scaly, purplish discoloration on the nose, cheeks, and ears. This is called lupus pernio. Less often, sarcoidosis causes cysts, pimples, or disfiguring over growths of skin. In many cases, the disfiguring over growths develop in areas of scars or tattoos.  · Eye symptoms. These include redness, eye pain, and sensitivity to light.  · Heart symptoms. These include irregular heartbeat and heart failure.  · Other symptoms. A person may have paralyzed facial muscles, seizures, psychiatric symptoms, swollen salivary glands, or bone pain.  DIAGNOSIS   Even when there are no symptoms, your caregiver can sometimes pick up signs of sarcoidosis during a routine examination, usually through a chest x-ray or when checking other complaints. The patient's age   and race or ethnic group can raise an additional red flag that a sign or symptom could be related to sarcoidosis.   · Enlargement of the salivary or tear glands and cysts in bone tissue may also be caused by sarcoidosis.  · You may have had a biopsy done that shows signs of sarcoidosis. A biopsy is a small tissue sample that is removed for laboratory testing. This tissue sample can  be taken from your lung, skin, lip, or another inflamed or abnormal area of the body.  · You may have had an abnormal chest X-ray. Although you appear healthy, a chest X-ray ordered for other reasons may turn up abnormalities that suggest sarcoidosis.  · Other tests may be needed. These tests may be done to rule out other illnesses or to determine the amount of organ damage caused by sarcoidosis. Some of the most common tests are:  · Blood levels of calcium or angiotensin-converting enzyme may be high in people with sarcoidosis.  · Blood tests to evaluate how well your liver is functioning.  · Lung function tests to measure how well you are breathing.  · A complete eye examination.  TREATMENT   If sarcoidosis does not cause any problems, treatment may not be necessary. Your caregiver may decide to simply monitor your condition. As part of this monitoring process, you may have frequent office visits, follow-up chest X-rays, and tests of your lung function.If you have signs of moderate or severe lung disease, your doctor may recommend:  · A corticosteroid drug, such as prednisone (sold under several brand names).  · Corticosteroids also are used to treat sarcoidosis of the eyes, joints, skin, nerves, or heart.  · Corticosteroid eye drops may be used for the eyes.  · Over-the-counter medications like nonsteroidal anti-inflammatory drugs (NSAID) often are used to treat joint pain first before corticosteroids, which tend to have more side effects.  · If corticosteroids are not effective or cause serious side effects, other drugs that alter or suppress the immune system may be used.  · In rare cases, when sarcoidosis causes life-threatening lung disease, a lung transplant may be necessary. However, there is some risk that the new lungs also will be attacked by sarcoidosis.  SEEK IMMEDIATE MEDICAL CARE IF:   · You suffer from shortness of breath or a lingering cough.  · You develop new problems that may be related to the  disease. Remember this disease can affect almost all organs of the body and cause many different problems.  Document Released: 04/18/2004 Document Revised: 09/10/2011 Document Reviewed: 09/26/2005  Exit[employer]® Patient Information ©2014 [employer].

## 2013-08-13 NOTE — Telephone Encounter (Signed)
Patient was in office today and they addressed the labs

## 2013-08-13 NOTE — Progress Notes (Signed)
Pt is here following up on her chest pain.

## 2013-08-13 NOTE — Progress Notes (Signed)
Patient ID: Anna [name], female   DOB: [birth year], 48 y.o.   MRN: [id number]   Anna [name], is a 48 y.o. female  [id number]  [id number]  DOB - 12/12/1965  Chief Complaint  Patient presents with  . Follow-up        Subjective:   Anna [name] is a 48 y.o. female here today to follow up for chest pain that was diagnosed as costochondritis per the patient. She had a negative EKG, troponin and a cardiology visit with (Dr. Daleen Squibb) and any cardiac involvement at this time has been ruled out.This pain started in April of 2014 that is worse on the left side. She states that it is a deep aching pain that is aggravated by activity and relieved by rest. She was sent for an echocardiogram in December. The patient states she experiences the pain upon awakening in the morning. Currently her pain is rated as a 1-2 on a pain scale from 0-10. She has a past medical history of sarcoidosis that is followed by pulmonology (Dr. Craige Cotta). She is currently taking prednisone for her sarcoidosis. She has experienced some weight gain recently which she attributes to the steroid use. She denies any tobacco, alcohol, or drug abuse. Her review of systems is negative. Patient has No headache, No chest pain, No abdominal pain - No Nausea, No new weakness tingling or numbness, No Cough - SOB.  Problem  Sarcoidosis  Chest Pain, Unspecified  Aneurysm of Heart (Wall)    ALLERGIES: No Known Allergies  PAST MEDICAL HISTORY: Past Medical History  Diagnosis Date  . Pulmonary sarcoidosis     and ocular.  biopsy 10/00, PFT 10/29/08 FEV1 2.24(77%), FVC 2.78(74%), TLC 3.72(68%), DLCO 61%, no BD response  . Vocal cord polyp     with hoarseness.  Dr. Altamese Cabal  . GERD (gastroesophageal reflux disease)   . OSA (obstructive sleep apnea)     PSG 11/03/05 RDI 6  . Obesity   . Mild intermittent asthma   . Rhinitis   . Glaucoma     MEDICATIONS AT HOME: Prior to Admission medications   Medication Sig Start Date End  Date Taking? Authorizing Provider  predniSONE (DELTASONE) 10 MG tablet Take 10 mg by mouth daily with breakfast. 06/18/13  Yes [name], MD  albuterol (PROAIR HFA) 108 (90 BASE) MCG/ACT inhaler Inhale 2 puffs into the lungs every 6 (six) hours as needed for wheezing. 09/22/12 09/22/13  Kalman Shan, MD     Objective:   Filed Vitals:   08/13/13 1419  BP: 135/87  Pulse: 84  Temp: 98.2 F (36.8 C)  TempSrc: Oral  Resp: 16  Height: 5\' 6"  (1.676 m)  Weight: 300 lb (136.079 kg)  SpO2: 98%    Exam General appearance : Awake, alert, not in any distress. Speech Clear. Not toxic looking HEENT: Atraumatic and Normocephalic, pupils equally reactive to light and accomodation Neck: supple, no JVD. No cervical lymphadenopathy.  Chest:Good air entry bilaterally, no added sounds  CVS: S1 S2 regular, no murmurs.  Abdomen: Bowel sounds present, Non tender and not distended with no gaurding, rigidity or rebound. Extremities: B/L Lower Ext shows no edema, both legs are warm to touch Neurology: Awake alert, and oriented X 3, CN II-XII intact, Non focal Skin:No Rash Wounds:N/A  Data Review:  Recent echocardiogram shows: Study Conclusions: - Left ventricle: The cavity size was normal. Wall thickness was normal. Systolic function was normal. The estimated ejection fraction was in the range of 50% to 55%. There is  akinesis and aneurysmal deformity of the apical myocardium. There is hypokinesis of the basal-midinferior myocardium. Doppler parameters are consistent with abnormal left ventricular relaxation (grade 1 diastolic dysfunction). - Mitral valve: Mobility of the posterior leaflet was mildly restricted. Moderate regurgitation. 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     Assessment & Plan   1. Sarcoidosis Stable Continue present medication, prednisone 5  mg tablet by mouth daily  2. Morbid obesity with BMI of 45.0-49.9, adult Patient was extensively counseled on nutrition and exercise  3. Chest pain, unspecified Stable  4. Aneurysm of heart (wall)  - Ambulatory referral to Cardiology: Patient is scheduled to see Dr. Daleen SquibbWall for possibility of starting anti-coagulation or not.   Return in about 3 months (around 11/10/2013), or if symptoms worsen or fail to improve, for Routine Follow Up.  The patient was given clear instructions to go to ER or return to medical center if symptoms don't improve, worsen or new problems develop. The patient verbalized understanding. The patient was told to call to get lab results if they haven't heard anything in the next week.    Jeanann LewandowskyJEGEDE, Jaquasha Carnevale, MD, MHA, FACP, FAAP [facility] [place] , [place] [phone]   08/13/2013, 3:26 PM

## 2013-08-14 ENCOUNTER — Telehealth: Payer: Self-pay | Admitting: Emergency Medicine

## 2013-08-14 MED ORDER — PREDNISONE 5 MG PO TABS: 5.0000 mg | ORAL_TABLET | Freq: Every day | ORAL | Status: DC

## 2013-08-14 NOTE — Progress Notes (Signed)
Patient ID: Anna RocaJewel E [name], female   DOB: Nov 01, 1965, 48 y.o.   MRN: [id number]  I personally called the patient to discuss the results of the 2-D echo. The patient has a small area of hypokinesis in her apex. Patient has been scheduled to discuss this with Dr. Daleen Squibbwall

## 2013-08-14 NOTE — Telephone Encounter (Signed)
08/12/2013    CLINICAL DATA:  Followup sarcoidosis and right middle lobe syndrome.   EXAM: CT CHEST WITH CONTRAST   TECHNIQUE: Multidetector CT imaging of the chest was performed during intravenous contrast administration.   CONTRAST:  100mL OMNIPAQUE IOHEXOL 300 MG/ML  SOLN  COMPARISON:  CT ANGIO CHEST W/CM &/OR WO/CM dated 06/15/2013   FINDINGS:  Again noted is mediastinal and bilateral hilar adenopathy. Subcarinal lymph node measures 2.2 cm compared to 2.5 cm previously. AP window lymph node measures 2.2 cm compared with 2.4 cm previously. Right paratracheal node measures 1.3 cm compared to 1.5 cm previously. Stable bilateral hilar adenopathy as well. These measurements or are short axis dimensions.  Continued volume loss and atelectasis in the right middle lobe due to compression on the airway from the adenopathy. This is unchanged. Bilateral pulmonary nodules are noted. Right upper lobe nodule on image 22 measures 6 mm, stable. Index left upper lobe nodule on image 20 measures 5 mm, stable. Several other small scattered pulmonary nodules are also stable.  No pleural effusions. Heart is mildly enlarged. Mild left ventricular apical thinning again noted, stable.  No axillary adenopathy. Chest wall soft tissues are unremarkable. Imaging into the upper abdomen shows no acute findings.  No acute bony abnormality.   IMPRESSION:  Essentially stable mediastinal and bilateral hilar adenopathy. Stable airway compression and volume loss/ atelectasis in the right middle lobe.  Stable bilateral scattered pulmonary nodules.    Electronically Signed   By: Charlett NoseKevin  Dover M.D.   On: 08/12/2013 11:15    Results d/w pt.  She has decreased size of LAN with prednisone tx.  Still has extrinsic compression of RML bronchus, but mild and appears improved with decreased amount of ATX.  Will have her decrease prednisone to 5 mg daily until next ROV.  Script sent.

## 2013-08-14 NOTE — Telephone Encounter (Signed)
Pt called in concerned about Echo report. Pt states, " I googled  information on Aneurysm and Im very scared". Assured pt not to get so worried, and I will tell Dr. Susie CassetteAbrol to call her for re assurance. Dr. Susie CassetteAbrol called pt and pt appt moved to see Dr. Daleen SquibbWall next Wednesday @ 915 am.

## 2013-08-19 ENCOUNTER — Encounter: Payer: Self-pay | Admitting: Cardiology

## 2013-08-19 ENCOUNTER — Ambulatory Visit (HOSPITAL_BASED_OUTPATIENT_CLINIC_OR_DEPARTMENT_OTHER): Payer: Medicaid Other | Admitting: Cardiology

## 2013-08-19 VITALS — BP 118/84 | HR 104 | Temp 98.8°F | Resp 16 | Ht 66.0 in | Wt 295.0 lb

## 2013-08-19 DIAGNOSIS — I253 Aneurysm of heart: Secondary | ICD-10-CM

## 2013-08-19 NOTE — Progress Notes (Signed)
Here to see heart specialist because of the findings of her echocardiogram.

## 2013-08-26 ENCOUNTER — Ambulatory Visit: Payer: Medicaid Other | Admitting: Cardiology

## 2013-08-26 ENCOUNTER — Encounter: Payer: Self-pay | Admitting: Cardiology

## 2013-08-26 ENCOUNTER — Ambulatory Visit: Payer: Medicaid Other | Attending: Cardiology | Admitting: Cardiology

## 2013-08-26 VITALS — BP 120/84 | HR 76 | Temp 98.4°F | Resp 16 | Ht 66.0 in | Wt 302.0 lb

## 2013-08-26 DIAGNOSIS — D869 Sarcoidosis, unspecified: Secondary | ICD-10-CM | POA: Insufficient documentation

## 2013-08-26 DIAGNOSIS — I059 Rheumatic mitral valve disease, unspecified: Secondary | ICD-10-CM | POA: Insufficient documentation

## 2013-08-26 DIAGNOSIS — Z09 Encounter for follow-up examination after completed treatment for conditions other than malignant neoplasm: Secondary | ICD-10-CM | POA: Insufficient documentation

## 2013-08-26 DIAGNOSIS — I253 Aneurysm of heart: Secondary | ICD-10-CM | POA: Insufficient documentation

## 2013-08-26 DIAGNOSIS — J99 Respiratory disorders in diseases classified elsewhere: Secondary | ICD-10-CM | POA: Insufficient documentation

## 2013-08-26 DIAGNOSIS — R079 Chest pain, unspecified: Secondary | ICD-10-CM

## 2013-08-26 NOTE — Progress Notes (Signed)
Pt is here to f/u with Cardiologist for abnormal ECHO Pt denies sob or pain at this time Taking Prednisone 5 mg for sarcoiddosis

## 2013-08-26 NOTE — Assessment & Plan Note (Signed)
As discussed under my note, we will not pursue any further diagnostic test at this point. I do not feel she needs to be on anticoagulation. We will repeat her echocardiogram in 6 months at which time I will revisit with her in the clinic. She is satisfied with this approach.

## 2013-08-26 NOTE — Assessment & Plan Note (Signed)
As noted before, I do not feel this is cardiac. Reassured patient.

## 2013-08-26 NOTE — Progress Notes (Signed)
Ms Anna [name] returns today to discuss the findings of her recent echocardiogram. This was performed on December 23rd 2014. It demonstrated a normal ejection fraction of 60-65%, mild restriction of posterior leaflet of the mitral valve with moderate mitral regurgitation, and an apical aneurysm. There was also hypokinesia of the inferior basal [name].  She's had no symptoms of ischemic heart disease. Her EKG was completely normal in October of 2014.  She does have significant pulmonary sarcoidosis. There was unusual for this to involve the apex, most commonly involving the lateral [name] of the heart, it is possible that this is the etiology of this abnormality. I've discussed this at length with the patient and have reviewed this with her answering all her questions. I've also discussed this with Dr. Jearld PiesMcClean concerning the value of an MRI of the heart. He and I both agree that this will add nothing to our clinical decision at this point nor any change in clinical management. She is already on steroids.  The patient's also not inclined to have this study unless it's going to make a difference in her care.

## 2013-09-24 ENCOUNTER — Telehealth: Payer: Self-pay | Admitting: Pulmonary Disease

## 2013-09-24 ENCOUNTER — Ambulatory Visit: Payer: Medicaid Other | Admitting: Pulmonary Disease

## 2013-09-24 NOTE — Telephone Encounter (Signed)
Spoke with patient Pt is sick and in bed(not related to pulmonary), unable to make it to appt today with Dr Craige CottaSood.   Pt states that her appt today was supposed to be a routine follow up and to consider having recheck CT scan of chest/PFT. This has been rescheduled to 10/14/13 at 11 with VS. Pt wanting to know if she can have this scan done between now and her upcoming appt.  See notes below from last OV. Pulmonary sarcoidosis - Coralyn HellingVineet Sood, MD at 02/27/2013 5:49 PM     Status: Written Related Problem: Pulmonary sarcoidosis    I explained to her CRP elevation is non-specific, and does not necessarily reflect recurrence of sarcoidosis. She is not having symptoms at present to suggest flare of sarcoidosis.  Recommend she continue therapy as prescribed by her PCP. She is to call when she gets follow up lab results by PCP. Will then determine if she needs additional testing to further assess status of sarcoidosis >> this would include PFT and CT chest with contrast.   Please advise Dr Craige CottaSood. Thanks!

## 2013-09-24 NOTE — Telephone Encounter (Signed)
Spoke with the pt and notified of recs per VS  She verbalized understanding  Nothing further needed 

## 2013-09-24 NOTE — Telephone Encounter (Signed)
She was actually last seen in February 2015.  She has been on prednisone.  It would be too early to repeat CT chest again.  Please have her continue prednisone 5 mg daily, and reschedule ROV when she can.  Will then re-assess status of sarcoidosis.

## 2013-10-14 ENCOUNTER — Encounter: Payer: Self-pay | Admitting: Pulmonary Disease

## 2013-10-14 ENCOUNTER — Ambulatory Visit (INDEPENDENT_AMBULATORY_CARE_PROVIDER_SITE_OTHER): Payer: Medicaid Other | Admitting: Pulmonary Disease

## 2013-10-14 VITALS — BP 122/82 | HR 68 | Temp 98.5°F | Ht 66.0 in | Wt 306.0 lb

## 2013-10-14 DIAGNOSIS — D860 Sarcoidosis of lung: Secondary | ICD-10-CM

## 2013-10-14 DIAGNOSIS — R059 Cough, unspecified: Secondary | ICD-10-CM

## 2013-10-14 DIAGNOSIS — J99 Respiratory disorders in diseases classified elsewhere: Secondary | ICD-10-CM

## 2013-10-14 DIAGNOSIS — R058 Other specified cough: Secondary | ICD-10-CM | POA: Insufficient documentation

## 2013-10-14 DIAGNOSIS — D869 Sarcoidosis, unspecified: Secondary | ICD-10-CM

## 2013-10-14 DIAGNOSIS — R05 Cough: Secondary | ICD-10-CM | POA: Insufficient documentation

## 2013-10-14 MED ORDER — PREDNISONE 5 MG PO TABS: 2.5000 mg | ORAL_TABLET | Freq: Every day | ORAL | Status: DC

## 2013-10-14 NOTE — Assessment & Plan Note (Signed)
Will have her decrease prednisone to 2.5 mg daily for 2 weeks, and then stop prednisone.  Will f/u in 2 months and then assess when she needs f/u CT chest.

## 2013-10-14 NOTE — Assessment & Plan Note (Signed)
Advised her to try OTC anti-histamines.

## 2013-10-14 NOTE — Patient Instructions (Signed)
Prednisone 5 mg pill >> take 1/2 pill daily for 2 weeks, then stop prednisone Follow up in 2 months

## 2013-10-14 NOTE — Progress Notes (Signed)
Chief Complaint  Patient presents with  . Sarcoidosis    Breathing has good days and bad. Reports DOE.    History of Present Illness: Anna [name] is a 48 y.o. female with sarcoid with pulmonary and ocular invovlement, mild/intermittent asthma, and mild OSA.  She remains on 5 mg prednisone since February.  Her chest discomfort is better.  She gets sinus congestion and wheeze in her throat in the morning.  She denies fever, sweats, skin rashes, joint swelling, or gland swelling.   TESTS: October 2000 >> Lung Biopsy proven sarcoidosis  PSG 11/03/05 >> RDI 6 PFT 10/29/08 >> FEV1 2.24 (77%), FVC 2.78 (74%), TLC 3.72 (68%), DLCO 61%, no BD response PFT 04/12/11 >> FEV1 2.20(77%), FEV1% 79, TLC 4.41 (81%), DLCO 51%, no BD CT chest 06/15/13 >> 2.4 cm Lt paratracheal node, 2.5 cm subcarinal node, b/l hilar LAN, scattered < 5 mm nodules b/l, volume loss RML with obstruction/compression of RML bronchus  Anna [name]  has a past medical history of Pulmonary sarcoidosis; Vocal cord polyp; GERD (gastroesophageal reflux disease); OSA (obstructive sleep apnea); Obesity; Mild intermittent asthma; Rhinitis; and Glaucoma.  Anna [name]  has no past surgical history on file.  Prior to Admission medications   Medication Sig Start Date End Date Taking? Authorizing Provider  albuterol (PROAIR HFA) 108 (90 BASE) MCG/ACT inhaler Inhale 2 puffs into the lungs every 6 (six) hours as needed for wheezing. 09/17/11 09/16/12 Yes Coralyn HellingVineet Shawni Volkov, MD  Dorzolamide HCl-Timolol Mal PF 22.3-6.8 MG/ML SOLN Apply 1 drop to eye every 12 (twelve) hours.   Yes Historical Provider, MD    No Known Allergies   Physical Exam:  General - No distress ENT - No sinus tenderness, no oral exudate, no LAN Cardiac - s1s2 regular, no murmur Chest - No wheeze/rales/dullness Back - No focal tenderness Abd - Soft, non-tender Ext - No edema Neuro - Normal strength Skin - No rashes Psych - normal mood, and  behavior   Assessment/Plan:  Coralyn HellingVineet Field Staniszewski, MD [place] [facility]/Critical [facility]/Sleep Pager:  [phone] 10/14/2013, 11:11 AM

## 2013-11-10 ENCOUNTER — Ambulatory Visit: Payer: Medicaid Other | Admitting: Internal Medicine

## 2013-11-10 ENCOUNTER — Telehealth: Payer: Self-pay | Admitting: Pulmonary Disease

## 2013-11-10 DIAGNOSIS — J4520 Mild intermittent asthma, uncomplicated: Secondary | ICD-10-CM

## 2013-11-10 MED ORDER — ALBUTEROL SULFATE HFA 108 (90 BASE) MCG/ACT IN AERS: 2.0000 | INHALATION_SPRAY | Freq: Four times a day (QID) | RESPIRATORY_TRACT | Status: DC | PRN

## 2013-11-10 NOTE — Telephone Encounter (Signed)
Called spoke with pt. She reports she needs her rescue inhaler. She has noticed some wheezing (on a scale 1-10 she rates this about a 2). I advised will send this in for her. If this doesn't help she will call us. Nothing further needed

## 2013-11-12 ENCOUNTER — Ambulatory Visit: Payer: Medicaid Other | Admitting: Internal Medicine

## 2013-12-03 ENCOUNTER — Encounter: Payer: Self-pay | Admitting: Internal Medicine

## 2013-12-03 ENCOUNTER — Ambulatory Visit: Payer: Medicaid Other | Attending: Internal Medicine | Admitting: Internal Medicine

## 2013-12-03 VITALS — BP 122/87 | HR 95 | Temp 99.1°F | Resp 16 | Wt 306.2 lb

## 2013-12-03 DIAGNOSIS — D869 Sarcoidosis, unspecified: Secondary | ICD-10-CM | POA: Insufficient documentation

## 2013-12-03 DIAGNOSIS — J45909 Unspecified asthma, uncomplicated: Secondary | ICD-10-CM | POA: Diagnosis not present

## 2013-12-03 DIAGNOSIS — J069 Acute upper respiratory infection, unspecified: Secondary | ICD-10-CM | POA: Insufficient documentation

## 2013-12-03 MED ORDER — LORATADINE 10 MG PO TABS: 10.0000 mg | ORAL_TABLET | Freq: Every day | ORAL | Status: DC

## 2013-12-03 MED ORDER — AMOXICILLIN 500 MG PO CAPS: 500.0000 mg | ORAL_CAPSULE | Freq: Three times a day (TID) | ORAL | Status: DC

## 2013-12-03 NOTE — Patient Instructions (Signed)

## 2013-12-03 NOTE — Progress Notes (Signed)
Patient ID: Anna [name], female   DOB: 06-Oct-1965, 48 y.o.   MRN: [id number]  CC: f/u  HPI: Patient reports that she is here for a follow up for sarcoidosis.  She states that she is being followed by pulmonology.  Patient reports that she has been having swelling in hands still and having ankle edema.  She d/c steroid use in April.  Patient reports that she has had cough for 3 weeks.  Constant all day cough, sneezing, runny nose, sore throat, wheezing, and head congestion.  She reports exposure to sick son that had recent respiratory infection. She has tried lemon tea for symptoms.  Patient reports all symptoms are resolved other than the cough. Denies fevers or chills, no mucous production, PND.    No Known Allergies Past Medical History  Diagnosis Date  . Pulmonary sarcoidosis     and ocular.  biopsy 10/00, PFT 10/29/08 FEV1 2.24(77%), FVC 2.78(74%), TLC 3.72(68%), DLCO 61%, no BD response  . Vocal cord polyp     with hoarseness.  Dr. Altamese Cabalavid Bates  . GERD (gastroesophageal reflux disease)   . OSA (obstructive sleep apnea)     PSG 11/03/05 RDI 6  . Obesity   . Mild intermittent asthma   . Rhinitis   . Glaucoma    Current Outpatient Prescriptions on File Prior to Visit  Medication Sig Dispense Refill  . albuterol (PROAIR HFA) 108 (90 BASE) MCG/ACT inhaler Inhale 2 puffs into the lungs every 6 (six) hours as needed for wheezing.  1 Inhaler  5  . predniSONE (DELTASONE) 5 MG tablet Take 0.5 tablets (2.5 mg total) by mouth daily with breakfast.  30 tablet  3   No current facility-administered medications on file prior to visit.   History reviewed. No pertinent family history. History   Social History  . Marital Status: Divorced    Spouse Name: N/A    Number of Children: N/A  . Years of Education: N/A   Occupational History  . Not on file.   Social History Main Topics  . Smoking status: Former Smoker -- 1.50 packs/day for 10 years    Types: Cigarettes    Quit date: 07/02/1997  .  Smokeless tobacco: Never Used  . Alcohol Use: No  . Drug Use: Not on file  . Sexual Activity: Not on file   Other Topics Concern  . Not on file   Social History Narrative  . No narrative on file    Review of Systems: See HPI  Objective:   Filed Vitals:   12/03/13 1453  BP: 122/87  Pulse: 95  Temp: 99.1 F (37.3 C)  Resp: 16   Physical Exam  Vitals reviewed. Constitutional: She is oriented to person, place, and time. She appears well-nourished.  HENT:  Right Ear: External ear normal.  Left Ear: External ear normal.  Mouth/Throat: Oropharynx is clear and moist.  Eyes: Conjunctivae are normal. Pupils are equal, round, and reactive to light.  Neck: Normal range of motion. Neck supple. No JVD present.  Cardiovascular: Normal rate, regular rhythm and normal heart sounds.   Pulmonary/Chest: Effort normal and breath sounds normal. She has no wheezes.  Abdominal: Soft. Bowel sounds are normal.  Lymphadenopathy:    She has no cervical adenopathy.  Neurological: She is alert and oriented to person, place, and time.     Lab Results  Component Value Date   WBC 6.9 04/21/2013   HGB 11.3* 04/21/2013   HCT 33.8* 04/21/2013   MCV 81.3 04/21/2013  PLT 230 04/21/2013   Lab Results  Component Value Date   CREATININE 1.24* 04/21/2013   BUN 19 04/21/2013   NA 132* 04/21/2013   K 3.7 04/21/2013   CL 100 04/21/2013   CO2 24 04/21/2013    Lab Results  Component Value Date   HGBA1C 5.7 11/30/2010   Lipid Panel     Component Value Date/Time   CHOL 197 04/30/2013 1109   TRIG 84 04/30/2013 1109   HDL 42 04/30/2013 1109   CHOLHDL 4.7 04/30/2013 1109   VLDL 17 04/30/2013 1109   LDLCALC 138* 04/30/2013 1109       Assessment and plan:   Anna [name] was seen today for follow-up.  Diagnoses and associated orders for this visit:  Acute upper respiratory infections of unspecified site - loratadine (CLARITIN) 10 MG tablet; Take 1 tablet (10 mg total) by mouth  daily. - amoxicillin (AMOXIL) 500 MG capsule; Take 1 capsule (500 mg total) by mouth 3 (three) times daily.    Return in about 6 months (around 06/04/2014). RTC if no improvement in symptoms.      Ambrose Finland, NP-C [facility] [phone] 12/06/2013, 8:26 PM

## 2013-12-03 NOTE — Progress Notes (Signed)
Patient here for follow up Has history of aneurysm

## 2013-12-21 ENCOUNTER — Telehealth: Payer: Self-pay | Admitting: Pulmonary Disease

## 2013-12-21 DIAGNOSIS — D860 Sarcoidosis of lung: Secondary | ICD-10-CM

## 2013-12-21 NOTE — Telephone Encounter (Signed)
Okay to double book visit with me for tomorrow AM.  Have CXR done first.

## 2013-12-21 NOTE — Telephone Encounter (Signed)
Spoke with the pt  She states she woke up in the night to use the restroom and when she went to lie back down she started having "coughing attack" and produced clear sputum with bright red streaks of blood  She went back to sleep and then the same thing happened again this am  She states not feeling bad besides having some increased wheezing over the past few wks- albuterol helps but she is needing this daily  No opening today or tomorrow on any providers schedule  Please advise, thanks!

## 2013-12-21 NOTE — Telephone Encounter (Signed)
Spoke with the pt and notified of recs per VS  She verbalized understanding  Ov with VS for 9 am tomorrow  I advised to come in a few min sooner for cxr and this order was placed to be done stat

## 2013-12-22 ENCOUNTER — Other Ambulatory Visit: Payer: Self-pay | Admitting: Pulmonary Disease

## 2013-12-22 ENCOUNTER — Ambulatory Visit (INDEPENDENT_AMBULATORY_CARE_PROVIDER_SITE_OTHER): Payer: Medicaid Other | Admitting: Pulmonary Disease

## 2013-12-22 ENCOUNTER — Encounter: Payer: Self-pay | Admitting: Pulmonary Disease

## 2013-12-22 ENCOUNTER — Ambulatory Visit (INDEPENDENT_AMBULATORY_CARE_PROVIDER_SITE_OTHER)
Admission: RE | Admit: 2013-12-22 | Discharge: 2013-12-22 | Disposition: A | Discharge status: Home / Self Care | Payer: Medicaid Other | Source: Ambulatory Visit | Attending: Pulmonary Disease | Admitting: Pulmonary Disease

## 2013-12-22 VITALS — BP 122/86 | HR 77 | Temp 97.9°F | Ht 66.0 in | Wt 300.2 lb

## 2013-12-22 DIAGNOSIS — D860 Sarcoidosis of lung: Secondary | ICD-10-CM

## 2013-12-22 DIAGNOSIS — R059 Cough, unspecified: Secondary | ICD-10-CM

## 2013-12-22 DIAGNOSIS — R058 Other specified cough: Secondary | ICD-10-CM

## 2013-12-22 DIAGNOSIS — D869 Sarcoidosis, unspecified: Secondary | ICD-10-CM

## 2013-12-22 DIAGNOSIS — J99 Respiratory disorders in diseases classified elsewhere: Secondary | ICD-10-CM

## 2013-12-22 DIAGNOSIS — R042 Hemoptysis: Secondary | ICD-10-CM

## 2013-12-22 DIAGNOSIS — R05 Cough: Secondary | ICD-10-CM

## 2013-12-22 DIAGNOSIS — J45909 Unspecified asthma, uncomplicated: Secondary | ICD-10-CM

## 2013-12-22 DIAGNOSIS — J4520 Mild intermittent asthma, uncomplicated: Secondary | ICD-10-CM

## 2013-12-22 NOTE — Patient Instructions (Signed)
Will schedule CT chest Follow up in 2 months

## 2013-12-22 NOTE — Assessment & Plan Note (Signed)
Continue OTC anti-histamines.

## 2013-12-22 NOTE — Progress Notes (Signed)
Chief Complaint  Patient presents with  . ACUTE OFFICE VISIT    Pt c/o incr cough x 1 month and hemoptysis x 1 day. Pt reports mucus with blood streaked mucus.    History of Present Illness: Anna [name] is a 48 y.o. female with sarcoid with pulmonary and ocular invovlement, mild/intermittent asthma, and mild OSA.  She developed more of a cough, and coughed up blood.  She was seen by PCP earlier this month and given amoxicillin and claritin.  She did not take the abx.  She has noticed more cough over the past month >> she has been off prednisone for this time.  She denies fever, rash, sweats, or chest pain.  She has some sinus congestion, and claritin helps with this.  She has cough with clear sputum, and some blood streaking.  TESTS: October 2000 >> Lung Biopsy proven sarcoidosis  PSG 11/03/05 >> RDI 6 PFT 10/29/08 >> FEV1 2.24 (77%), FVC 2.78 (74%), TLC 3.72 (68%), DLCO 61%, no BD response PFT 04/12/11 >> FEV1 2.20(77%), FEV1% 79, TLC 4.41 (81%), DLCO 51%, no BD CT chest 06/15/13 >> 2.4 cm Lt paratracheal node, 2.5 cm subcarinal node, b/l hilar LAN, scattered < 5 mm nodules b/l, volume loss RML with obstruction/compression of RML bronchus  Anna [name]  has a past medical history of Pulmonary sarcoidosis; Vocal cord polyp; GERD (gastroesophageal reflux disease); OSA (obstructive sleep apnea); Obesity; Mild intermittent asthma; Rhinitis; and Glaucoma.  Anna [name]  has no past surgical history on file.  Prior to Admission medications   Medication Sig Start Date End Date Taking? Authorizing Provider  albuterol (PROAIR HFA) 108 (90 BASE) MCG/ACT inhaler Inhale 2 puffs into the lungs every 6 (six) hours as needed for wheezing. 09/17/11 09/16/12 Yes Coralyn HellingVineet Sood, MD  Dorzolamide HCl-Timolol Mal PF 22.3-6.8 MG/ML SOLN Apply 1 drop to eye every 12 (twelve) hours.   Yes Historical Provider, MD    No Known Allergies   Physical Exam:  General - No distress ENT - No sinus tenderness,  no oral exudate, no LAN Cardiac - s1s2 regular, no murmur Chest - faint wheeze Rt mid lung field clear with cough Back - No focal tenderness Abd - Soft, non-tender Ext - No edema Neuro - Normal strength Skin - No rashes Psych - normal mood, and behavior  Dg Chest 2 View  12/22/2013   CLINICAL DATA:  Hemoptysis and dyspnea.  History of sarcoidosis.  EXAM: CHEST  2 VIEW  COMPARISON:  04/21/2013, 11/30/2010 and 11/28/2012 and CT 08/12/2013.  FINDINGS: Lungs are adequately inflated with continued chronic opacification over the right middle lobe which is slightly worse and shown to represent atelectasis on previous CT. Cannot completely exclude developing infectious process over the right middle lobe. Stable sub cm nodular densities over the upper lobes. Cardiac silhouette is within normal. There is evidence of patient's bilateral hilar adenopathy which is stable. Remainder of the exam is unchanged.  IMPRESSION: Slight worsening opacification over the right middle lobe some of which is due to chronic atelectasis/ scarring as cannot exclude worsening atelectasis versus developing infection.  Stable subcentimeter bilateral pulmonary nodules as demonstrated on previous CT scan. Stable bilateral hilar adenopathy compatible with known sarcoidosis.   Electronically Signed   By: Elberta Fortisaniel  Boyle M.D.   On: 12/22/2013 09:27      Assessment/Plan:  Coralyn HellingVineet Sood, MD [place] [facility]/Critical Care/Sleep Pager:  [phone] 12/22/2013, 9:29 AM

## 2013-12-22 NOTE — Assessment & Plan Note (Signed)
Will repeat CT chest with contrast and call her with results.  Will then discuss whether she needs to resume prednisone +/- Abx or proceed with bronchoscopy.

## 2013-12-22 NOTE — Assessment & Plan Note (Signed)
Continue prn albuterol

## 2013-12-24 ENCOUNTER — Ambulatory Visit (INDEPENDENT_AMBULATORY_CARE_PROVIDER_SITE_OTHER)
Admission: RE | Admit: 2013-12-24 | Discharge: 2013-12-24 | Disposition: A | Discharge status: Home / Self Care | Payer: Medicaid Other | Source: Ambulatory Visit | Attending: Pulmonary Disease | Admitting: Pulmonary Disease

## 2013-12-24 DIAGNOSIS — D869 Sarcoidosis, unspecified: Secondary | ICD-10-CM

## 2013-12-24 DIAGNOSIS — R042 Hemoptysis: Secondary | ICD-10-CM

## 2013-12-24 MED ORDER — IOHEXOL 300 MG/ML  SOLN
80.0000 mL | Freq: Once | INTRAMUSCULAR | Status: AC | PRN
  Administered 2013-12-24: 80 mL via INTRAVENOUS

## 2013-12-25 ENCOUNTER — Telehealth: Payer: Self-pay | Admitting: Pulmonary Disease

## 2013-12-25 NOTE — Telephone Encounter (Signed)
Pt aware VS not on schedule today but will be back in on Monday. Pt is requetsign CT results. Please advise thanks

## 2013-12-25 NOTE — Telephone Encounter (Signed)
Dg Chest 2 View  12/22/2013   CLINICAL DATA:  Hemoptysis and dyspnea.  History of sarcoidosis.  EXAM: CHEST  2 VIEW  COMPARISON:  04/21/2013, 11/30/2010 and 11/28/2012 and CT 08/12/2013.  FINDINGS: Lungs are adequately inflated with continued chronic opacification over the right middle lobe which is slightly worse and shown to represent atelectasis on previous CT. Cannot completely exclude developing infectious process over the right middle lobe. Stable sub cm nodular densities over the upper lobes. Cardiac silhouette is within normal. There is evidence of patient's bilateral hilar adenopathy which is stable. Remainder of the exam is unchanged.  IMPRESSION: Slight worsening opacification over the right middle lobe some of which is due to chronic atelectasis/ scarring as cannot exclude worsening atelectasis versus developing infection.  Stable subcentimeter bilateral pulmonary nodules as demonstrated on previous CT scan. Stable bilateral hilar adenopathy compatible with known sarcoidosis.   Electronically Signed   By: Elberta Fortisaniel  Boyle M.D.   On: 12/22/2013 09:27   Ct Chest W Contrast  12/24/2013   CLINICAL DATA:  History of sarcoidosis. Worsening cough and hemoptysis.  EXAM: CT CHEST WITH CONTRAST  TECHNIQUE: Multidetector CT imaging of the chest was performed during intravenous contrast administration.  CONTRAST:  80mL OMNIPAQUE IOHEXOL 300 MG/ML  SOLN  COMPARISON:  Chest CT 08/12/2013.  FINDINGS: Mediastinum: Heart size is mildly enlarged. There is no significant pericardial fluid, thickening or pericardial calcification. Extensive bilateral hilar and mediastinal lymphadenopathy, similar to the prior study. The bulkiest mediastinal lymph nodes include high right paratracheal lymph nodes measuring 1.4 cm in short axis, subcarinal lymph nodes measuring 21 mm in short axis, and a right hilar nodal mass which measures up to 3.6 x 3.7 cm. Esophagus is unremarkable in appearance.  Lungs/Pleura: Compared to the prior  study there is now complete atelectasis of the right middle lobe, related to obstruction from the previously described right hilar nodal mass. There are several small noncalcified pulmonary nodules scattered throughout the lungs bilaterally which are unchanged in size, number and pattern of distribution compared to the prior study, with the largest nodule measuring up to 6 mm in the right upper lobe (image 19 of series 3). No new suspicious appearing pulmonary nodules or masses are otherwise noted. No acute consolidative airspace disease. No pleural effusions.  Upper Abdomen: Several hepatic lesions are noted, including a 1.9 x 1.1 cm intermediate attenuation lesion in segment 2 of the liver which appears slightly larger than the prior study, a 1.6 x 1.0 cm intermediate attenuation lesion in segment 8 of the liver which appears unchanged, a new ill-defined lesion measuring approximately 1.6 x 2.1 cm in the periphery of segment 8 (image 50 of series 2), and a new 8 mm lesion in segment 3 of the liver which is too small to characterize.  Musculoskeletal: There are no aggressive appearing lytic or blastic lesions noted in the visualized portions of the skeleton.  IMPRESSION: 1. The appearance the chest is again compatible with the reported clinical diagnosis of sarcoidosis. Compared to the prior study, the right hilar nodal mass now causes complete obstruction of the right middle lobe bronchus, and there is complete atelectasis of the right middle lobe. 2. All previously noted pulmonary nodules appear unchanged compared to the prior study, and presumably benign related to sarcoidosis. 3. Multiple hepatic lesions. Some of these appear stable while others appear enlarged and others are new compared to the prior study. These are incompletely characterized on today's examination, and further evaluation with nonemergent MRI of the abdomen  with and without IV gadolinium is suggested near future to provide definitive  characterization.   Electronically Signed   By: Trudie Reedaniel  Entrikin M.D.   On: 12/24/2013 15:20    Spoke with pt about results.  Will need to likely set up for EBUS.  Will call her back on 12/28/13 about scheduling this.

## 2013-12-28 ENCOUNTER — Telehealth: Payer: Self-pay | Admitting: Emergency Medicine

## 2013-12-28 ENCOUNTER — Telehealth: Payer: Self-pay | Admitting: Internal Medicine

## 2013-12-28 NOTE — Telephone Encounter (Signed)
Pt says she called last week and was told a nurse would call her back but did not receive call. Pt was concerned because she had a cough w/ blood.

## 2013-12-29 ENCOUNTER — Telehealth: Payer: Self-pay | Admitting: Pulmonary Disease

## 2013-12-29 NOTE — Telephone Encounter (Signed)
I called spoke with pt. She reports Thursday would not be a good day but can do any of the other days, anytime. Please advise Dr. Craige CottaSood thanks

## 2013-12-29 NOTE — Telephone Encounter (Signed)
Per phone note 12/25/13: Spoke with pt about results. Will need to likely set up for EBUS. Will call her back on 12/28/13 about scheduling this ---  Called pt. Made her aware will send message over to Dr. Craige CottaSood. Please advise thanks

## 2013-12-29 NOTE — Telephone Encounter (Signed)
Please inform pt that I have d/w Dr. Charleston RopesZubelivitskiy about scheduling pt for EBUS.  This will be schedule for next week.  Please check with pt if there are specific dates next week she would not be available to do procedure.  Otherwise will call her again once procedure scheduled.

## 2013-12-30 NOTE — Telephone Encounter (Signed)
Spoke with pt and apologized for not return call. Informed pt ot come to clinic if she doesn't here from anyone or in case of urgency

## 2014-01-04 NOTE — Telephone Encounter (Signed)
EBUS scheduled for 01/12/14@[facility]  to be done by Dr Herma CarsonZ both he and pt are aware of this appt Anna [name]

## 2014-01-04 NOTE — Telephone Encounter (Signed)
Was this taken care of?  Pt has a bronch scheduled with Dr Herma CarsonZ on 7.14.15 but [facility]  LMOM TCB x1 for pt to verify that everything is taken care of.  Will leave in triage d/t the nature of the call.

## 2014-01-04 NOTE — Telephone Encounter (Signed)
Pt returning calls.Caren GriffinsStanley A Dalton

## 2014-01-04 NOTE — Telephone Encounter (Signed)
Called, spoke with pt -  She is concerned bc she was under the impression she was to have EBUS set up for this week but hasn't heard anything further about this being scheduled.  Pt is wondering "what is next" and would like to have procedure done asap.  Dr. Craige CottaSood is off until Thursday.  Advised I would discuss with PCCs to see if this has been scheduled yet and office would contact her back today with a response either way per her request.  She verbalized understanding.   Almyra FreeLibby, do you know if this has been scheduled?

## 2014-01-05 ENCOUNTER — Telehealth: Payer: Self-pay | Admitting: Pulmonary Disease

## 2014-01-05 NOTE — Telephone Encounter (Signed)
Called and spoke with pt and she stated that she was calling us back to make us aware that she is scheduled for her EBUS on Tuesday.  Nothing further is needed.

## 2014-01-06 ENCOUNTER — Encounter (HOSPITAL_COMMUNITY): Payer: Self-pay | Admitting: Pharmacy Technician

## 2014-01-08 ENCOUNTER — Encounter (HOSPITAL_COMMUNITY): Payer: Self-pay

## 2014-01-08 ENCOUNTER — Encounter (HOSPITAL_COMMUNITY)
Admission: RE | Admit: 2014-01-08 | Discharge: 2014-01-08 | Disposition: A | Discharge status: Home / Self Care | Payer: Medicaid Other | Source: Ambulatory Visit | Attending: Pulmonary Disease | Admitting: Pulmonary Disease

## 2014-01-08 DIAGNOSIS — Z01812 Encounter for preprocedural laboratory examination: Secondary | ICD-10-CM | POA: Diagnosis not present

## 2014-01-08 LAB — COMPREHENSIVE METABOLIC PANEL
ALT: 16 U/L (ref 0–35)
AST: 23 U/L (ref 0–37)
Albumin: 3.5 g/dL (ref 3.5–5.2)
Alkaline Phosphatase: 89 U/L (ref 39–117)
Anion gap: 14 (ref 5–15)
BILIRUBIN TOTAL: 0.3 mg/dL (ref 0.3–1.2)
BUN: 15 mg/dL (ref 6–23)
CALCIUM: 9.3 mg/dL (ref 8.4–10.5)
CHLORIDE: 100 meq/L (ref 96–112)
CO2: 26 meq/L (ref 19–32)
CREATININE: 0.93 mg/dL (ref 0.50–1.10)
GFR calc non Af Amer: 73 mL/min — ABNORMAL LOW (ref >90)
GFR, EST AFRICAN AMERICAN: 84 mL/min — AB (ref >90)
GLUCOSE: 83 mg/dL (ref 70–99)
Potassium: 4.2 mEq/L (ref 3.7–5.3)
Sodium: 140 mEq/L (ref 137–147)
Total Protein: 8.2 g/dL (ref 6.0–8.3)

## 2014-01-08 LAB — APTT: aPTT: 32 seconds (ref 24–37)

## 2014-01-08 LAB — CBC
HCT: 35.6 % — ABNORMAL LOW (ref 36.0–46.0)
HEMOGLOBIN: 11.1 g/dL — AB (ref 12.0–15.0)
MCH: 24.4 pg — AB (ref 26.0–34.0)
MCHC: 31.2 g/dL (ref 30.0–36.0)
MCV: 78.4 fL (ref 78.0–100.0)
Platelets: 272 10*3/uL (ref 150–400)
RBC: 4.54 MIL/uL (ref 3.87–5.11)
RDW: 13.4 % (ref 11.5–15.5)
WBC: 5.5 10*3/uL (ref 4.0–10.5)

## 2014-01-08 LAB — PROTIME-INR
INR: 1.07 (ref 0.00–1.49)
PROTHROMBIN TIME: 13.9 s (ref 11.6–15.2)

## 2014-01-08 NOTE — Progress Notes (Signed)
Consent DOS pt with questions for doctor.

## 2014-01-08 NOTE — Pre-Procedure Instructions (Signed)
Anna [name]  01/08/2014   Your procedure is scheduled on:  January 12, 2014  [facility] at 7 AM.  Call this number if you have problems the morning of surgery: [phone]   Remember:   Do not eat food or drink liquids after midnight.   Take these medicines the morning of surgery with A SIP OF WATER: albuterol (PROVENTIL HFA;VENTOLIN HFA) if needed    Do not wear jewelry, make-up or nail polish.  Do not wear lotions, powders, or perfumes. You may wear deodorant.  Do not shave 48 hours prior to surgery.   Do not bring valuables to the hospital.  [facility] any belongings or valuables.               Contacts, dentures or bridgework may not be worn into surgery.  Leave suitcase in the car. After surgery it may be brought to your room.  For patients admitted to the hospital, discharge time is determined by your treatment team.               Patients discharged the day of surgery will not be allowed to drive home.  Name and phone number of your driver:       Please read over the following fact sheets that you were given: Pain Booklet, Coughing and Deep Breathing and Surgical Site Infection Prevention

## 2014-01-12 ENCOUNTER — Encounter (HOSPITAL_COMMUNITY): Payer: Medicaid Other | Admitting: Anesthesiology

## 2014-01-12 ENCOUNTER — Encounter (HOSPITAL_COMMUNITY)
Admission: RE | Disposition: A | Discharge status: Home / Self Care | Payer: Self-pay | Source: Ambulatory Visit | Attending: Pulmonary Disease

## 2014-01-12 ENCOUNTER — Ambulatory Visit (HOSPITAL_COMMUNITY)
Admission: RE | Admit: 2014-01-12 | Discharge: 2014-01-12 | Disposition: A | Discharge status: Home / Self Care | Payer: Medicaid Other | Source: Ambulatory Visit | Attending: Pulmonary Disease | Admitting: Pulmonary Disease

## 2014-01-12 ENCOUNTER — Ambulatory Visit (HOSPITAL_COMMUNITY): Payer: Medicaid Other | Admitting: Anesthesiology

## 2014-01-12 ENCOUNTER — Encounter (HOSPITAL_COMMUNITY): Payer: Self-pay | Admitting: Anesthesiology

## 2014-01-12 DIAGNOSIS — R599 Enlarged lymph nodes, unspecified: Secondary | ICD-10-CM | POA: Insufficient documentation

## 2014-01-12 DIAGNOSIS — H409 Unspecified glaucoma: Secondary | ICD-10-CM | POA: Diagnosis not present

## 2014-01-12 DIAGNOSIS — Z6841 Body Mass Index (BMI) 40.0 and over, adult: Secondary | ICD-10-CM | POA: Diagnosis not present

## 2014-01-12 DIAGNOSIS — G4733 Obstructive sleep apnea (adult) (pediatric): Secondary | ICD-10-CM | POA: Insufficient documentation

## 2014-01-12 DIAGNOSIS — Z79899 Other long term (current) drug therapy: Secondary | ICD-10-CM | POA: Insufficient documentation

## 2014-01-12 DIAGNOSIS — J99 Respiratory disorders in diseases classified elsewhere: Secondary | ICD-10-CM | POA: Diagnosis not present

## 2014-01-12 DIAGNOSIS — D860 Sarcoidosis of lung: Secondary | ICD-10-CM

## 2014-01-12 DIAGNOSIS — J45909 Unspecified asthma, uncomplicated: Secondary | ICD-10-CM | POA: Diagnosis not present

## 2014-01-12 DIAGNOSIS — K219 Gastro-esophageal reflux disease without esophagitis: Secondary | ICD-10-CM | POA: Diagnosis not present

## 2014-01-12 DIAGNOSIS — R590 Localized enlarged lymph nodes: Secondary | ICD-10-CM

## 2014-01-12 DIAGNOSIS — D869 Sarcoidosis, unspecified: Secondary | ICD-10-CM

## 2014-01-12 DIAGNOSIS — Z87891 Personal history of nicotine dependence: Secondary | ICD-10-CM | POA: Diagnosis not present

## 2014-01-12 HISTORY — PX: VIDEO BRONCHOSCOPY WITH ENDOBRONCHIAL ULTRASOUND: SHX6177

## 2014-01-12 SURGERY — BRONCHOSCOPY, WITH EBUS
Anesthesia: General | Site: Bronchus

## 2014-01-12 MED ORDER — FENTANYL CITRATE 0.05 MG/ML IJ SOLN
INTRAMUSCULAR | Status: AC
  Filled 2014-01-12: qty 5

## 2014-01-12 MED ORDER — GLYCOPYRROLATE 0.2 MG/ML IJ SOLN
INTRAMUSCULAR | Status: DC | PRN
  Administered 2014-01-12: 0.4 mg via INTRAVENOUS

## 2014-01-12 MED ORDER — MIDAZOLAM HCL 2 MG/2ML IJ SOLN
INTRAMUSCULAR | Status: AC
Start: 2014-01-12 — End: 2014-01-12
  Filled 2014-01-12: qty 2

## 2014-01-12 MED ORDER — ONDANSETRON HCL 4 MG/2ML IJ SOLN
INTRAMUSCULAR | Status: DC | PRN
  Administered 2014-01-12: 4 mg via INTRAVENOUS

## 2014-01-12 MED ORDER — HYDROMORPHONE HCL PF 1 MG/ML IJ SOLN
0.2500 mg | INTRAMUSCULAR | Status: DC | PRN
  Administered 2014-01-12: 0.25 mg via INTRAVENOUS

## 2014-01-12 MED ORDER — MIDAZOLAM HCL 5 MG/5ML IJ SOLN
INTRAMUSCULAR | Status: DC | PRN
  Administered 2014-01-12: 2 mg via INTRAVENOUS

## 2014-01-12 MED ORDER — PROPOFOL 10 MG/ML IV BOLUS
INTRAVENOUS | Status: DC | PRN
  Administered 2014-01-12: 200 mg via INTRAVENOUS

## 2014-01-12 MED ORDER — ROCURONIUM BROMIDE 100 MG/10ML IV SOLN
INTRAVENOUS | Status: DC | PRN
  Administered 2014-01-12: 20 mg via INTRAVENOUS

## 2014-01-12 MED ORDER — ONDANSETRON HCL 4 MG/2ML IJ SOLN: 4.0000 mg | Freq: Once | INTRAMUSCULAR | Status: DC | PRN

## 2014-01-12 MED ORDER — FENTANYL CITRATE 0.05 MG/ML IJ SOLN
INTRAMUSCULAR | Status: DC | PRN
  Administered 2014-01-12 (×2): 100 ug via INTRAVENOUS

## 2014-01-12 MED ORDER — LACTATED RINGERS IV SOLN
INTRAVENOUS | Status: DC
  Administered 2014-01-12: 08:00:00 via INTRAVENOUS

## 2014-01-12 MED ORDER — LIDOCAINE HCL (CARDIAC) 20 MG/ML IV SOLN
INTRAVENOUS | Status: DC | PRN
Start: 2014-01-12 — End: 2014-01-12
  Administered 2014-01-12: 50 mg via INTRAVENOUS

## 2014-01-12 MED ORDER — 0.9 % SODIUM CHLORIDE (POUR BTL) OPTIME
TOPICAL | Status: DC | PRN
  Administered 2014-01-12: 1000 mL

## 2014-01-12 MED ORDER — HYDROMORPHONE HCL PF 1 MG/ML IJ SOLN
INTRAMUSCULAR | Status: AC
  Filled 2014-01-12: qty 1

## 2014-01-12 MED ORDER — LACTATED RINGERS IV SOLN
INTRAVENOUS | Status: DC | PRN
  Administered 2014-01-12: 09:00:00 via INTRAVENOUS

## 2014-01-12 MED ORDER — NEOSTIGMINE METHYLSULFATE 10 MG/10ML IV SOLN
INTRAVENOUS | Status: DC | PRN
  Administered 2014-01-12: 3 mg via INTRAVENOUS

## 2014-01-12 MED ORDER — PROPOFOL 10 MG/ML IV BOLUS
INTRAVENOUS | Status: AC
Start: 2014-01-12 — End: 2014-01-12
  Filled 2014-01-12: qty 20

## 2014-01-12 MED ORDER — LIDOCAINE HCL 4 % MT SOLN
OROMUCOSAL | Status: DC | PRN
  Administered 2014-01-12: 4 mL via TOPICAL

## 2014-01-12 SURGICAL SUPPLY — 26 items
BLADE 10 SAFETY STRL DISP (BLADE) IMPLANT
BRUSH CYTOL CELLEBRITY 1.5X140 (MISCELLANEOUS) IMPLANT
CANISTER SUCTION 2500CC (MISCELLANEOUS) ×3 IMPLANT
CONT SPEC 4OZ CLIKSEAL STRL BL (MISCELLANEOUS) ×3 IMPLANT
COVER TABLE BACK 60X90 (DRAPES) ×3 IMPLANT
FORCEPS BIOP RJ4 1.8 (CUTTING FORCEPS) ×3 IMPLANT
GLOVE BIO SURGEON STRL SZ 6.5 (GLOVE) ×2 IMPLANT
GLOVE BIO SURGEONS STRL SZ 6.5 (GLOVE) ×1
GLOVE SURG SIGNA 7.5 PF LTX (GLOVE) ×3 IMPLANT
GOWN STRL REUS W/ TWL LRG LVL3 (GOWN DISPOSABLE) ×2 IMPLANT
GOWN STRL REUS W/TWL LRG LVL3 (GOWN DISPOSABLE) ×6
KIT ROOM TURNOVER OR (KITS) ×3 IMPLANT
MARKER SKIN DUAL TIP RULER LAB (MISCELLANEOUS) ×3 IMPLANT
NEEDLE BIOPSY TRANSBRONCH 21G (NEEDLE) IMPLANT
NEEDLE SYS SONOTIP II EBUSTBNA (NEEDLE) ×6 IMPLANT
NS IRRIG 1000ML POUR BTL (IV SOLUTION) ×3 IMPLANT
OIL SILICONE PENTAX (PARTS (SERVICE/REPAIRS)) IMPLANT
PAD ARMBOARD 7.5X6 YLW CONV (MISCELLANEOUS) ×6 IMPLANT
SPONGE GAUZE 4X4 12PLY (GAUZE/BANDAGES/DRESSINGS) ×3 IMPLANT
SYR 20CC LL (SYRINGE) ×3 IMPLANT
SYR 20ML ECCENTRIC (SYRINGE) ×6 IMPLANT
SYR 50ML SLIP (SYRINGE) ×3 IMPLANT
TOWEL OR 17X24 6PK STRL BLUE (TOWEL DISPOSABLE) ×3 IMPLANT
TRAP SPECIMEN MUCOUS 40CC (MISCELLANEOUS) ×3 IMPLANT
TUBE CONNECTING 20'X1/4 (TUBING) ×1
TUBE CONNECTING 20X1/4 (TUBING) ×2 IMPLANT

## 2014-01-12 NOTE — Op Note (Signed)
Name:  Anna RocaJewel E [name] MRN:  [id number] DOB:  1965-11-17  PROCEDURE NOTE  Procedure(s): Flexible bronchoscopy [phone]) Bronchial alveolar lavage [phone]) of RLL Endobronchial biopsy (86578([phone]) of airway mucosa Endobronchial ultrasound ([id number]) Transbronchial needle aspiration (95284(31629) of station 7 LN Transbronchial needle aspiration, additional lobe (13244(31633) of the 10 R LN  Indications:  Hilar / mediastinal lymphadenopathy.  Consent:  Procedure, benefits, risks and alternatives discussed.  Questions answered.  Consent obtained.  Anesthesia:  General endotracheal.  Procedure summary:  Appropriate equipment was assembled.  The patient was brought to the operating room and identified as [name].  Safety timeout was performed. The patient was placed supine on the operating table, airway established and general anesthesia administered by Anesthesia team.   After the appropriate level of anesthesia was assured, flexible video bronchoscope was lubricated and inserted through the endotracheal tube.  Airway examination was performed bilaterally to subsegmental level.  Mucosa was friable and irregular, especially in the right lung airway where it had cobblestone appearance.  RML airway was nearly completely occluded by extrinsic compression, RLL airway was narrowed.  There were minimal non-bloody, non-purulent secretions.  No overt endobronchial lesions were noted.  Bronchial alveolar lavage of RLL was performed with 60 mL of normal saline and return of 15 mL of fluid, after which the bronchoscope was withdrawn.  Endobronchial ultrasound video bronchoscope was then lubricated and inserted through the endotracheal tube. Surveillance of the mediastinal and and bilateral hilar lymph node stations was performed.  Pathologically enlarged lymph nodes were noted.  Endobronchial ultrasound guided transbronchial needle aspiration of station 7 and station 10R LN was performed, after which EBUS bronchoscope  was withdrawn.  Intraoperative assessment revealed lymphoid tissue without granulomas or atypical cells from station 7 node and scant lymphoid tissue with elements of granulomatous inflammation from station 10R node.  Flexible video bronchoscope was used again to perform random endobronchial mucosal biopsies from R lung airways.  After hemostasis was assure, the bronchoscope was withdrawn.  The patient was extubated in operating room and transferred to PACU.  Specimens sent: Bronchial alveolar lavage specimen of RLL for microbiology and cytology Station 7 and station 10R LN samples for pathological assessment, flow cytometry and fungal smear and tissue culture  Complications:  No immediate complications were noted.  Hemodynamic parameters and oxygenation remained stable throughout the procedure.  Estimated blood loss:  Less then 25 mL.  Orlean BradfordK. Nahiara Kretzschmar, M.D. Pulmonary and Critical Care Medicine [facility] Cell: [phone]  01/12/2014, 3:09 PM

## 2014-01-12 NOTE — Transfer of Care (Signed)
Immediate Anesthesia Transfer of Care Note  Patient: Anna [name]  Procedure(s) Performed: Procedure(s): VIDEO BRONCHOSCOPY WITH ENDOBRONCHIAL ULTRASOUND (N/A)  Patient Location: PACU  Anesthesia Type:General  Level of Consciousness: awake, alert , oriented and patient cooperative  Airway & Oxygen Therapy: Patient Spontanous Breathing and Patient connected to face mask oxygen  Post-op Assessment: Report given to PACU RN and Post -op Vital signs reviewed and stable  Post vital signs: Reviewed and stable  Complications: No apparent anesthesia complications

## 2014-01-12 NOTE — Discharge Instructions (Signed)

## 2014-01-12 NOTE — Anesthesia Preprocedure Evaluation (Signed)
Anesthesia Evaluation  Patient identified by MRN, date of birth, ID band Patient awake    Reviewed: Allergy & Precautions, H&P , NPO status , Patient's Chart, lab work & pertinent test results  Airway       Dental   Pulmonary asthma , sleep apnea , former smoker,          Cardiovascular     Neuro/Psych    GI/Hepatic GERD-  ,  Endo/Other  Morbid obesity  Renal/GU      Musculoskeletal   Abdominal   Peds  Hematology   Anesthesia Other Findings   Reproductive/Obstetrics                           Anesthesia Physical Anesthesia Plan  ASA: III  Anesthesia Plan: General   Post-op Pain Management:    Induction: Intravenous  Airway Management Planned: Oral ETT  Additional Equipment:   Intra-op Plan:   Post-operative Plan: Extubation in OR  Informed Consent: I have reviewed the patients History and Physical, chart, labs and discussed the procedure including the risks, benefits and alternatives for the proposed anesthesia with the patient or authorized representative who has indicated his/her understanding and acceptance.     Plan Discussed with:   Anesthesia Plan Comments:         Anesthesia Quick Evaluation

## 2014-01-12 NOTE — H&P (Signed)
Name: Anna [name] MRN: [id number] DOB: [birth year]  Referring Physician:  Craige Cotta Reason for Consultation:  Mediastinal lymphademopathy   INTERVENTIONAL PULMONOLOGY CONSULTATION NOTE   History of Present Illness: 48 yo  AAF  former smoker who developed persistent weight loss, eye redness, dryness and photophobia in 2000 and her chest imaging demonstrated mediastinal / hilar lymphadenopathy and sarcoidosis was suspected.  Reportedly bronchoscopy / biopsy was performed by Dr. 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 She was treated with systemic steroids with clinical improvement.  She was followed by Dr. Craige Cotta since and was on and off steroids al this time.  Of note, she reports not having any respiratory symptoms until march of 2014 when she started experiencing persistent cough and wheezing, especially in supine position.  She denies sputum production or hemoptysis.  There were no feveres or night sweats. Symptoms did not improve with systemic steroids.  Chest CT was performed and demonstrated mediastinal / hilar lymphadenopathy with significant extrinsic obstruction of the R lung airways, stable pulmonary nodules and multiple hepatic lesion.  She was referred for bronchoscopic evaluation.  Past Medical History  Diagnosis Date  . Pulmonary sarcoidosis     and ocular.  biopsy 10/00, PFT 10/29/08 FEV1 2.24(77%), FVC 2.78(74%), TLC 3.72(68%), DLCO 61%, no BD response  . Vocal cord polyp     with hoarseness.  Dr. Altamese Cabal  . GERD (gastroesophageal reflux disease)   . OSA (obstructive sleep apnea)     PSG 11/03/05 RDI 6  . Obesity   . Mild intermittent asthma   . Rhinitis   . Glaucoma    Past Surgical History  Procedure Laterality Date  . Fracture surgery    . Bronchoscopy    . Dental surgery     Prior to Admission medications   Medication Sig Start Date End Date Taking? Authorizing Provider  albuterol (PROVENTIL HFA;VENTOLIN HFA) 108 (90 BASE) MCG/ACT inhaler Inhale 1-2  puffs into the lungs every 6 (six) hours as needed for wheezing or shortness of breath.   Yes Historical Provider, MD  latanoprost (XALATAN) 0.005 % ophthalmic solution Place 1 drop into the right eye at bedtime.   Yes Historical Provider, MD   Allergies No Known Allergies  Family History family history is not on file.  Social History History   Social History  . Marital Status: Divorced    Spouse Name: N/A    Number of Children: N/A  . Years of Education: N/A   Occupational History  . Not on file.   Social History Main Topics  . Smoking status: Former Smoker -- 1.50 packs/day for 10 years    Types: Cigarettes    Quit date: 07/02/1997  . Smokeless tobacco: Never Used  . Alcohol Use: No  . Drug Use: Not on file  . Sexual Activity: Not on file   Other Topics Concern  . Not on file   Social History Narrative  . No narrative on file   Review Of Systems: Constitutional: Negative for fever, chills, weight loss, malaise/fatigue and diaphoresis. Positive for weight gain. HENT: Negative for hearing loss, ear pain, nosebleeds, congestion, sore throat, neck pain, tinnitus and ear discharge.   Eyes: Negative for blurred vision, double vision, photophobia, pain, discharge and redness.  Respiratory: Negative hemoptysis, sputum production, shortness of breath, and stridor.  Positive for wheezing and cough. Cardiovascular: Negative for chest pain, palpitations, orthopnea, claudication, leg swelling and PND.  Gastrointestinal: Negative for heartburn, nausea, vomiting, abdominal pain, diarrhea, constipation, blood in stool and melena.  Genitourinary: Negative for dysuria, urgency, frequency, hematuria and flank pain.  Musculoskeletal: Negative for myalgias, back pain, joint pain and falls.  Skin: Negative for itching and rash. Positive for skin dryness.  Neurological: Negative for dizziness, tingling, tremors, sensory change, speech change, focal weakness, seizures, loss of consciousness,  weakness and headaches.  Endo/Heme/Allergies: Negative for environmental allergies and polydipsia. Does not bruise/bleed easily.   Vital Signs: Filed Vitals:   01/12/14 0718 01/12/14 0727  BP: 161/82   Pulse: 89   Temp: 98.1 F (36.7 C)   TempSrc: Oral   Resp: 20   Height:  5\' 6"  (1.676 m)  Weight: 133.811 kg (295 lb)   SpO2: 100%     Physical Examination: General:  No acute distress, overweight Neuro:  Alert, oriented, pleasant, nonfocal HEENT:  Pink conjunctivae, moist mucus membranes Neck:  No lymphadenopathy Cardiovascular:  RRR, no murmurs Lungs:  Bilateral air entry, no wheezes, rhonchi, rales Abdomen:  Soft, nontender, nondistended Musculoskeletal:  No clubbing, cyanosis or edema Skin: No rash   Labs:  Recent Labs Lab 01/08/14 1501  HGB 11.1*  HCT 35.6*  WBC 5.5  PLT 272  NA 140  K 4.2  CL 100  CO2 26  GLUCOSE 83  BUN 15  CREATININE 0.93  CALCIUM 9.3  INR 1.07  APTT 32   Chest CT(images reviewed):        ASSESSMENT AND PLAN  Mediastinal / hilar lymphadenopathy Sarcoidosis ( DDx fungal infection, lymphoma / solid malignancy ( less likely ) ) Brachiographically  complete obstruction of RML airway and partial obstruction of RLL airway   FOB / airway examination  BAL of the RML  EBUS TBNA of statin 7 ( subcarinal ) and station 10R ( R hilar ) LN - will send for pathology assessment, tissue culture and flow cytometry  Orlean BradfordK. Randalyn Ahmed, M.D., F.C.C.P. Interventional Pulmonology [facility] Cell: [phone] Pager: [phone]  01/12/2014, 8:10 AM

## 2014-01-12 NOTE — Anesthesia Postprocedure Evaluation (Signed)
  Anesthesia Post-op Note  Patient: Anna [name]  Procedure(s) Performed: Procedure(s): VIDEO BRONCHOSCOPY WITH ENDOBRONCHIAL ULTRASOUND (N/A)  Patient Location: PACU  Anesthesia Type:General  Level of Consciousness: awake, alert , oriented and patient cooperative  Airway and Oxygen Therapy: Patient Spontanous Breathing  Post-op Pain: none  Post-op Assessment: Post-op Vital signs reviewed, Patient's Cardiovascular Status Stable, Respiratory Function Stable, Patent Airway and No signs of Nausea or vomiting  Post-op Vital Signs: stable  Last Vitals:  Filed Vitals:   01/12/14 1115  BP: 138/68  Pulse: 84  Temp: 36.4 C  Resp: 15    Complications: No apparent anesthesia complications

## 2014-01-12 NOTE — Progress Notes (Signed)
Report given to lauren reynolds rn as caregiver 

## 2014-01-14 LAB — CULTURE, RESPIRATORY

## 2014-01-14 LAB — CULTURE, RESPIRATORY W GRAM STAIN

## 2014-01-15 ENCOUNTER — Encounter (HOSPITAL_COMMUNITY): Payer: Self-pay | Admitting: Pulmonary Disease

## 2014-01-15 LAB — CULTURE, ROUTINE-ABSCESS

## 2014-01-19 ENCOUNTER — Telehealth: Payer: Self-pay | Admitting: Pulmonary Disease

## 2014-01-19 NOTE — Telephone Encounter (Signed)
Called spoke with pt. She is requesting bronch results from last week. Please advise Dr. Craige CottaSood thanks

## 2014-01-20 MED ORDER — PREDNISONE 20 MG PO TABS: 40.0000 mg | ORAL_TABLET | Freq: Every day | ORAL | Status: DC

## 2014-01-20 NOTE — Telephone Encounter (Signed)
Discussed EBUS results with pt.  Consistent with sarcoidosis >> no evidence for malignancy.    Will have her start prednisone 40 mg daily, and remain on this until her follow up appt on August 24.

## 2014-02-01 LAB — FUNGUS CULTURE W SMEAR: Fungal Smear: NONE SEEN

## 2014-02-08 LAB — FUNGUS CULTURE W SMEAR
Fungal Smear: NONE SEEN
Special Requests: 7

## 2014-02-22 ENCOUNTER — Ambulatory Visit (INDEPENDENT_AMBULATORY_CARE_PROVIDER_SITE_OTHER): Payer: Medicaid Other | Admitting: Pulmonary Disease

## 2014-02-22 ENCOUNTER — Encounter: Payer: Self-pay | Admitting: Pulmonary Disease

## 2014-02-22 VITALS — BP 126/84 | HR 85 | Ht 66.0 in | Wt 292.8 lb

## 2014-02-22 DIAGNOSIS — D869 Sarcoidosis, unspecified: Secondary | ICD-10-CM

## 2014-02-22 DIAGNOSIS — J4520 Mild intermittent asthma, uncomplicated: Secondary | ICD-10-CM

## 2014-02-22 DIAGNOSIS — J45909 Unspecified asthma, uncomplicated: Secondary | ICD-10-CM

## 2014-02-22 DIAGNOSIS — D860 Sarcoidosis of lung: Secondary | ICD-10-CM

## 2014-02-22 DIAGNOSIS — J99 Respiratory disorders in diseases classified elsewhere: Secondary | ICD-10-CM

## 2014-02-22 MED ORDER — PREDNISONE 20 MG PO TABS: ORAL_TABLET | ORAL | Status: DC

## 2014-02-22 NOTE — Progress Notes (Signed)
Chief Complaint  Patient presents with  . Follow-up    Pt reports that the prednisone has worked to resolve the hemoptysis. Pt states that cough is resolved--occassional cough. Pt wants to discuss decreasing prednisone.     History of Present Illness: Anna [name] is a 48 y.o. female with sarcoid with pulmonary and ocular invovlement, mild/intermittent asthma, and mild OSA.  Her cough and chest pain have improved.  She is not longer coughing up blood.  She is not having as much wheeze.  She denies fever or skin rash.  She has been feeling more anxious since being on prednisone.  She feels her breathing is better, and she is able to do activity more easily.  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

## 2014-02-22 NOTE — Patient Instructions (Signed)
Prednisone 20 mg pills >> 1.5 pills daily for 2 weeks, then  1 pill daily Follow up in 1 month

## 2014-02-24 NOTE — Assessment & Plan Note (Signed)
Continue prn albuterol

## 2014-02-24 NOTE — Assessment & Plan Note (Signed)
Clinically improved since restarting high dose prednisone July 2015.  Will have her change to 30 mg prednisone daily for 2 weeks, then switch to 20 mg daily until next ROV in 1 month.  Explained that she will need f/u CT chest at some point to monitor response to therapy >> likely plan to do this when her prednisone dose is down to less than 10 mg daily.  Advised her to f/u with PCP to monitor bone density while she is on prednisone.

## 2014-03-01 ENCOUNTER — Encounter: Payer: Self-pay | Admitting: Internal Medicine

## 2014-03-01 ENCOUNTER — Ambulatory Visit: Payer: Medicaid Other | Attending: Internal Medicine | Admitting: Internal Medicine

## 2014-03-01 VITALS — BP 123/84 | HR 82 | Temp 98.9°F | Resp 16 | Ht 66.0 in | Wt 290.0 lb

## 2014-03-01 DIAGNOSIS — J381 Polyp of vocal cord and larynx: Secondary | ICD-10-CM | POA: Insufficient documentation

## 2014-03-01 DIAGNOSIS — IMO0002 Reserved for concepts with insufficient information to code with codable children: Secondary | ICD-10-CM | POA: Diagnosis not present

## 2014-03-01 DIAGNOSIS — M940 Chondrocostal junction syndrome [Tietze]: Secondary | ICD-10-CM | POA: Diagnosis not present

## 2014-03-01 DIAGNOSIS — J310 Chronic rhinitis: Secondary | ICD-10-CM | POA: Insufficient documentation

## 2014-03-01 DIAGNOSIS — E669 Obesity, unspecified: Secondary | ICD-10-CM | POA: Insufficient documentation

## 2014-03-01 DIAGNOSIS — Z87891 Personal history of nicotine dependence: Secondary | ICD-10-CM | POA: Diagnosis not present

## 2014-03-01 DIAGNOSIS — D869 Sarcoidosis, unspecified: Secondary | ICD-10-CM | POA: Diagnosis not present

## 2014-03-01 DIAGNOSIS — J45909 Unspecified asthma, uncomplicated: Secondary | ICD-10-CM | POA: Diagnosis not present

## 2014-03-01 DIAGNOSIS — R079 Chest pain, unspecified: Secondary | ICD-10-CM | POA: Insufficient documentation

## 2014-03-01 DIAGNOSIS — R209 Unspecified disturbances of skin sensation: Secondary | ICD-10-CM | POA: Diagnosis present

## 2014-03-01 DIAGNOSIS — K219 Gastro-esophageal reflux disease without esophagitis: Secondary | ICD-10-CM | POA: Diagnosis not present

## 2014-03-01 DIAGNOSIS — G4733 Obstructive sleep apnea (adult) (pediatric): Secondary | ICD-10-CM | POA: Diagnosis not present

## 2014-03-01 DIAGNOSIS — J99 Respiratory disorders in diseases classified elsewhere: Secondary | ICD-10-CM | POA: Diagnosis not present

## 2014-03-01 DIAGNOSIS — H409 Unspecified glaucoma: Secondary | ICD-10-CM | POA: Diagnosis not present

## 2014-03-01 LAB — GLUCOSE, POCT (MANUAL RESULT ENTRY): POC Glucose: 93 mg/dl (ref 70–99)

## 2014-03-01 LAB — COMPLETE METABOLIC PANEL WITH GFR
ALT: 26 U/L (ref 0–35)
AST: 18 U/L (ref 0–37)
Albumin: 3.8 g/dL (ref 3.5–5.2)
Alkaline Phosphatase: 53 U/L (ref 39–117)
BUN: 15 mg/dL (ref 6–23)
CALCIUM: 9.6 mg/dL (ref 8.4–10.5)
CHLORIDE: 99 meq/L (ref 96–112)
CO2: 28 mEq/L (ref 19–32)
Creat: 1.03 mg/dL (ref 0.50–1.10)
GFR, EST NON AFRICAN AMERICAN: 65 mL/min
GFR, Est African American: 75 mL/min
GLUCOSE: 89 mg/dL (ref 70–99)
Potassium: 3.6 mEq/L (ref 3.5–5.3)
Sodium: 136 mEq/L (ref 135–145)
Total Bilirubin: 0.3 mg/dL (ref 0.2–1.2)
Total Protein: 6.9 g/dL (ref 6.0–8.3)

## 2014-03-01 LAB — POCT GLYCOSYLATED HEMOGLOBIN (HGB A1C): HEMOGLOBIN A1C: 5.8

## 2014-03-01 NOTE — Progress Notes (Signed)
Pt was having adverse affects from the prednisone. Tingling and numbness in extremity's and face.

## 2014-03-01 NOTE — Patient Instructions (Signed)
Costochondritis Costochondritis, sometimes called Tietze syndrome, is a swelling and irritation (inflammation) of the tissue (cartilage) that connects your ribs with your breastbone (sternum). It causes pain in the chest and rib area. Costochondritis usually goes away on its own over time. It can take up to 6 weeks or longer to get better, especially if you are unable to limit your activities. CAUSES  Some cases of costochondritis have no known cause. Possible causes include:  Injury (trauma).  Exercise or activity such as lifting.  Severe coughing. SIGNS AND SYMPTOMS  Pain and tenderness in the chest and rib area.  Pain that gets worse when coughing or taking deep breaths.  Pain that gets worse with specific movements. DIAGNOSIS  Your health care provider will do a physical exam and ask about your symptoms. Chest X-rays or other tests may be done to rule out other problems. TREATMENT  Costochondritis usually goes away on its own over time. Your health care provider may prescribe medicine to help relieve pain. HOME CARE INSTRUCTIONS   Avoid exhausting physical activity. Try not to strain your ribs during normal activity. This would include any activities using chest, abdominal, and side muscles, especially if heavy weights are used.  Apply ice to the affected area for the first 2 days after the pain begins.  Put ice in a plastic bag.  Place a towel between your skin and the bag.  Leave the ice on for 20 minutes, 2-3 times a day.  Only take over-the-counter or prescription medicines as directed by your health care provider. SEEK MEDICAL CARE IF:  You have redness or swelling at the rib joints. These are signs of infection.  Your pain does not go away despite rest or medicine. SEEK IMMEDIATE MEDICAL CARE IF:   Your pain increases or you are very uncomfortable.  You have shortness of breath or difficulty breathing.  You cough up blood.  You have worse chest pains,  sweating, or vomiting.  You have a fever or persistent symptoms for more than 2-3 days.  You have a fever and your symptoms suddenly get worse. MAKE SURE YOU:   Understand these instructions.  Will watch your condition.  Will get help right away if you are not doing well or get worse. Document Released: 03/28/2005 Document Revised: 04/08/2013 Document Reviewed: 01/20/2013 Exit[employer] Patient Information 2015 [employer]. This information is not intended to replace advice given to you by your health care provider. Make sure you discuss any questions you have with your health care provider.  

## 2014-03-01 NOTE — Progress Notes (Signed)
Patient ID: Anna [name], female   DOB: 05-Apr-1966, 48 y.o.   MRN: [id number]  CC: Tingling of face  HPI:  Tingling in left arm, leg, and left side of face for about 1 week.  It started once she decreased her dose of steroids.  She has been tested for DM 2 years ago and was negative.  She reports that she has been on chronic steroid use for the past 15 years.  She reports some left-sided chest pain that is reproducible with touched.   No Known Allergies Past Medical History  Diagnosis Date  . Pulmonary sarcoidosis     and ocular.  biopsy 10/00, PFT 10/29/08 FEV1 2.24(77%), FVC 2.78(74%), TLC 3.72(68%), DLCO 61%, no BD response  . Vocal cord polyp     with hoarseness.  Dr. Altamese Cabal  . GERD (gastroesophageal reflux disease)   . OSA (obstructive sleep apnea)     PSG 11/03/05 RDI 6  . Obesity   . Mild intermittent asthma   . Rhinitis   . Glaucoma    Current Outpatient Prescriptions on File Prior to Visit  Medication Sig Dispense Refill  . albuterol (PROVENTIL HFA;VENTOLIN HFA) 108 (90 BASE) MCG/ACT inhaler Inhale 1-2 puffs into the lungs every 6 (six) hours as needed for wheezing or shortness of breath.      . latanoprost (XALATAN) 0.005 % ophthalmic solution Place 1 drop into the right eye at bedtime.      . predniSONE (DELTASONE) 20 MG tablet 1.5 pills daily for 2 weeks, then 1 pill daily  45 tablet  1   No current facility-administered medications on file prior to visit.   History reviewed. No pertinent family history. History   Social History  . Marital Status: Divorced    Spouse Name: N/A    Number of Children: N/A  . Years of Education: N/A   Occupational History  . Not on file.   Social History Main Topics  . Smoking status: Former Smoker -- 1.50 packs/day for 10 years    Types: Cigarettes    Quit date: 07/02/1997  . Smokeless tobacco: Never Used  . Alcohol Use: No  . Drug Use: Not on file  . Sexual Activity: Not on file   Other Topics Concern  . Not on file    Social History Narrative  . No narrative on file   Review of Systems  HENT: Negative.   Eyes: Negative.   Respiratory: Negative for cough and shortness of breath.   Cardiovascular: Positive for chest pain. Negative for palpitations, claudication and leg swelling.  Neurological: Positive for tingling. Negative for dizziness and tremors.      Objective:   Filed Vitals:   03/01/14 1045  BP: 123/84  Pulse: 82  Temp: 98.9 F (37.2 C)  Resp: 16    Physical Exam  Cardiovascular: Normal rate, regular rhythm and normal heart sounds.   Tenderness to palpate left side of chest  Pulmonary/Chest: Effort normal and breath sounds normal.  Abdominal: Soft. Bowel sounds are normal.  Musculoskeletal: Normal range of motion.  Neurological: She is alert.     Lab Results  Component Value Date   WBC 5.5 01/08/2014   HGB 11.1* 01/08/2014   HCT 35.6* 01/08/2014   MCV 78.4 01/08/2014   PLT 272 01/08/2014   Lab Results  Component Value Date   CREATININE 0.93 01/08/2014   BUN 15 01/08/2014   NA 140 01/08/2014   K 4.2 01/08/2014   CL 100 01/08/2014   CO2  26 01/08/2014    Lab Results  Component Value Date   HGBA1C 5.7 11/30/2010   Lipid Panel     Component Value Date/Time   CHOL 197 04/30/2013 1109   TRIG 84 04/30/2013 1109   HDL 42 04/30/2013 1109   CHOLHDL 4.7 04/30/2013 1109   VLDL 17 04/30/2013 1109   LDLCALC 138* 04/30/2013 1109       Assessment and plan:   Anna [name] was seen today for follow-up.  Diagnoses and associated orders for this visit:  Costochondritis Steroid use she improved symptoms. May use ibuprofen to help with pain relief Steroid long-term use - HM DEXA SCAN - POCT glycosylated hemoglobin (Hb A1C) - POCT glucose (manual entry) - COMPLETE METABOLIC PANEL WITH GFR   Return if symptoms worsen or fail to improve.        Holland Commons, NP-C [facility] [phone] 03/01/2014, 11:23 AM

## 2014-03-02 ENCOUNTER — Telehealth: Payer: Self-pay | Admitting: *Deleted

## 2014-03-02 NOTE — Telephone Encounter (Signed)
Message copied by Dyann Kief on Tue Mar 02, 2014  4:02 PM ------      Message from: Holland Commons A      Created: Tue Mar 02, 2014  2:25 PM       Please call patient and her know labs look great and her potassium is normal ------

## 2014-03-02 NOTE — Telephone Encounter (Signed)
Patient calling for lab results. Informed patient her  Potassium and other labs is normal. Patient verbalized understanding. Annamaria Helling, RN

## 2014-03-02 NOTE — Telephone Encounter (Signed)
Pt aware of lab results 

## 2014-03-02 NOTE — Telephone Encounter (Signed)
Message copied by Dyann Kief on Tue Mar 02, 2014  4:00 PM ------      Message from: Anna [name]      Created: Tue Mar 02, 2014  2:25 PM       Please call patient and her know labs look great and her potassium is normal ------

## 2014-03-25 ENCOUNTER — Ambulatory Visit (INDEPENDENT_AMBULATORY_CARE_PROVIDER_SITE_OTHER): Payer: Medicaid Other | Admitting: Pulmonary Disease

## 2014-03-25 ENCOUNTER — Encounter: Payer: Self-pay | Admitting: Pulmonary Disease

## 2014-03-25 VITALS — BP 122/88 | HR 88 | Ht 66.0 in | Wt 294.0 lb

## 2014-03-25 DIAGNOSIS — J99 Respiratory disorders in diseases classified elsewhere: Secondary | ICD-10-CM

## 2014-03-25 DIAGNOSIS — D869 Sarcoidosis, unspecified: Secondary | ICD-10-CM

## 2014-03-25 DIAGNOSIS — D860 Sarcoidosis of lung: Secondary | ICD-10-CM

## 2014-03-25 MED ORDER — PREDNISONE 10 MG PO TABS: ORAL_TABLET | ORAL | Status: DC

## 2014-03-25 NOTE — Progress Notes (Signed)
Chief Complaint  Patient presents with  . Follow-up    Pt denied any complaints of cough or SOB. Pt taking  Prednisone daily.     History of Present Illness: Anna [name] is a 48 y.o. female with sarcoid with pulmonary and ocular invovlement, mild/intermittent asthma, and mild OSA.  Her breathing has improved.  She is able to do more exercise.  She is not having cough, wheeze, fever, sweats, or hemoptysis.  She is trying change her diet, and thinking about becoming vegetarian.  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  Physical Exam:  General - No distress ENT - No sinus tenderness, no oral exudate, no LAN Cardiac - s1s2 regular, no murmur Chest - no wheeze/rales Back - No focal tenderness Abd - Soft, non-tender Ext - No edema Neuro - Normal strength Skin - No rashes Psych - normal mood, and behavior  CMP     Component Value Date/Time   NA 136 03/01/2014 1142   K 3.6 03/01/2014 1142   CL 99 03/01/2014 1142   CO2 28 03/01/2014 1142   GLUCOSE 89 03/01/2014 1142   BUN 15 03/01/2014 1142   CREATININE 1.03 03/01/2014 1142   CREATININE 0.93 01/08/2014 1501   CALCIUM 9.6 03/01/2014 1142   PROT 6.9 03/01/2014 1142   ALBUMIN 3.8 03/01/2014 1142   AST 18 03/01/2014 1142   ALT 26 03/01/2014 1142   ALKPHOS 53 03/01/2014 1142   BILITOT 0.3 03/01/2014 1142   GFRNONAA 65 03/01/2014 1142   GFRNONAA 73* 01/08/2014 1501   GFRAA 75 03/01/2014 1142   GFRAA 84* 01/08/2014 1501      Assessment/Plan:  [name], MD [facility]/Sleep Pager:  [phone] 03/25/2014, 3:01 PM

## 2014-03-25 NOTE — Assessment & Plan Note (Signed)
Clinically improved since restarting high dose prednisone July 2015.  Will have her change to 15 mg prednisone daily for 2 weeks, then switch to 10 mg daily until next ROV in 6 weeks.  Explained that she will need f/u CT chest at some point to monitor response to therapy >> likely plan to do this when her prednisone dose is down to less than 10 mg daily.

## 2014-03-25 NOTE — Patient Instructions (Signed)
Prednisone 10 mg pills >> take 1.5 pills daily for 2 weeks, then 1 pill daily until next visit Follow up in 6 weeks

## 2014-03-26 ENCOUNTER — Telehealth: Payer: Self-pay | Admitting: Pulmonary Disease

## 2014-03-26 MED ORDER — PREDNISONE 5 MG PO TABS: ORAL_TABLET | ORAL | Status: DC

## 2014-03-26 NOTE — Telephone Encounter (Signed)
Called spoke with pharm. Reports when pt is cutting the pred 10 mg in half they are crumbling and she is losing the medication. She asks if we can send in pred 5 mg instead for pt. Please advise Dr. Craige Cotta thanks

## 2014-03-26 NOTE — Telephone Encounter (Signed)
Called spoke with [facility]. Verified RX sent in. Nothing further needed

## 2014-03-26 NOTE — Telephone Encounter (Signed)
Okay to send script for 5 mg pills of prednisone.  Dispense 14 pills with no refills.  She need to take one 5 mg pill and one 10 mg pill daily for 2 weeks, then change to one 10 mg pill daily until next visit.

## 2014-03-26 NOTE — Telephone Encounter (Signed)
RX has been sent in. Nothing further needed 

## 2014-04-27 ENCOUNTER — Telehealth: Payer: Self-pay | Admitting: Pulmonary Disease

## 2014-04-27 MED ORDER — PREDNISONE 10 MG PO TABS: 10.0000 mg | ORAL_TABLET | Freq: Every day | ORAL | Status: DC

## 2014-04-27 NOTE — Telephone Encounter (Signed)
Okay to send refill for prednisone 10 mg pill, one pill daily, dispense 30 pills with 2 refills.

## 2014-04-27 NOTE — Telephone Encounter (Signed)
Patient requesting refill on Prednisone.  Ok to refill?  If so, what dose should patient be taking now?

## 2014-04-27 NOTE — Telephone Encounter (Signed)
Rx was sent to pharm  Pt aware  Nothing further needed 

## 2014-05-05 ENCOUNTER — Telehealth: Payer: Self-pay | Admitting: Pulmonary Disease

## 2014-05-05 NOTE — Telephone Encounter (Signed)
I called spoke with pt. She is requesting to speak with VS personally. She wants to explain her symptoms to VS in more detail. Per pt she advised me she has started coughing again,. Wheezing (worse in morning), increase SOB.  She currently does not have insurance/medicaid. Please advise VS thanks  No Known Allergies

## 2014-05-05 NOTE — Telephone Encounter (Signed)
She has felt her breathing and energy have gotten worse after decreasing prednisone to 10 mg per day.  She was feeling better with higher dose.  Advised her to increase prednisone to 15 mg per day and stay on this until next visit later this month.  She should call back if she is not improving  She also was informed that she is no longer on Medicaid since her son is older than 8819.  She is in the process of applying for Medicaid for herself.  I have advised her that we can work around any financial issues, and that she should keep her appointment with me.

## 2014-05-18 ENCOUNTER — Ambulatory Visit: Payer: Medicaid Other

## 2014-05-21 ENCOUNTER — Ambulatory Visit (INDEPENDENT_AMBULATORY_CARE_PROVIDER_SITE_OTHER): Payer: Medicaid Other | Admitting: Pulmonary Disease

## 2014-05-21 ENCOUNTER — Encounter: Payer: Self-pay | Admitting: Pulmonary Disease

## 2014-05-21 VITALS — BP 118/80 | HR 78 | Temp 98.6°F | Ht 66.0 in | Wt 300.0 lb

## 2014-05-21 DIAGNOSIS — D860 Sarcoidosis of lung: Secondary | ICD-10-CM

## 2014-05-21 MED ORDER — PREDNISONE 10 MG PO TABS: 10.0000 mg | ORAL_TABLET | Freq: Every day | ORAL | Status: DC

## 2014-05-21 MED ORDER — PREDNISONE 5 MG PO TABS: 5.0000 mg | ORAL_TABLET | Freq: Every day | ORAL | Status: DC

## 2014-05-21 NOTE — Patient Instructions (Signed)
Prednisone 15 mg daily for 3 weeks >> if okay, then take 15 mg prednisone alternating with 10 mg prednisone every other day Follow up in 2 months

## 2014-05-21 NOTE — Assessment & Plan Note (Signed)
Will have her increase prednisone to 15 mg per day for 3 weeks, and then decrease as tolerated.  She will need f/u CT scan at some point >> issue might be related to her lose of insurance; hopefully she will have medicaid approved by next visit.

## 2014-05-21 NOTE — Progress Notes (Signed)
Chief Complaint  Patient presents with  . Follow-up    Pt states that she ishaving increased SOB with heavier exertion--walking longer distances and stairs.     History of Present Illness: Anna [name] is a 48 y.o. female with sarcoid with pulmonary and ocular invovlement, mild/intermittent asthma, and mild OSA.  She was not able to tolerate reduction in prednisone dose.  She did not increase back to 15 mg prednisone yet.  She did not want to split her pills.  She gets winded going up stairs.  She feels like she had more energy when on higher dose of prednisone.  She is not having fever, rash, chest pain, wheeze, or sputum.  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  Physical Exam:  General - No distress ENT - No sinus tenderness, no oral exudate, no LAN Cardiac - s1s2 regular, no murmur Chest - no wheeze/rales Back - No focal tenderness Abd - Soft, non-tender Ext - No edema Neuro - Normal strength Skin - No rashes Psych - normal mood, and behavior  Assessment/Plan:  Coralyn HellingVineet Chevonne Bostrom, MD [facility]/Critical Care/Sleep Pager:  [phone] 05/21/2014, 2:48 PM

## 2014-06-30 ENCOUNTER — Telehealth: Payer: Self-pay | Admitting: Pulmonary Disease

## 2014-06-30 NOTE — Telephone Encounter (Signed)
Called and spoke with pt and she stated that the disability appt was scheduled on the same date as her appt with VS on 07/21/14 at 3 pm and this appt is in [place].  Her appt with VS is on 07/21/14 at 2:45 and she stated that she cannot miss the disability appt since they are going to do her PFT>  Pt said that she needs to see VS since she feels that her breathing is acting up and she cannot wait until an appt with VS in feb 2016.   VS please advise if you want us to double book your schedule for this pt.  Thanks  No Known Allergies  Current Outpatient Prescriptions on File Prior to Visit  Medication Sig Dispense Refill  . albuterol (PROVENTIL HFA;VENTOLIN HFA) 108 (90 BASE) MCG/ACT inhaler Inhale 1-2 puffs into the lungs every 6 (six) hours as needed for wheezing or shortness of breath.    . latanoprost (XALATAN) 0.005 % ophthalmic solution Place 1 drop into the right eye at bedtime.    . predniSONE (DELTASONE) 10 MG tablet Take 1 tablet (10 mg total) by mouth daily. 30 tablet 2  . predniSONE (DELTASONE) 5 MG tablet Take 1 tablet (5 mg total) by mouth daily with breakfast. 30 tablet 2   No current facility-administered medications on file prior to visit.

## 2014-06-30 NOTE — Telephone Encounter (Signed)
Okay to double book visit. 

## 2014-07-01 NOTE — Telephone Encounter (Signed)
Called, spoke with pt.  Appt scheduled with Dr. Craige CottaSood for Jul 14, 2014 at 1:30 (this is the next time Dr. Craige CottaSood will be in office).  Pt confirmed appt and voiced no further questions or concerns at this time.  Jul 21, 3014 appt cancelled - pt aware.  Note:  Given below msg states pt reports "breathing is acting up" offered pt prior to Jul 14, 2014 with another provider or NP.  Pt declined; wanting to wait to see Dr. Craige CottaSood.  Advised to call office back if symptoms worsen prior to appt.  She verbalized understanding, is in agreement with plan, and voiced no further questions or concerns at this time.

## 2014-07-14 ENCOUNTER — Encounter: Payer: Self-pay | Admitting: Pulmonary Disease

## 2014-07-14 ENCOUNTER — Ambulatory Visit (INDEPENDENT_AMBULATORY_CARE_PROVIDER_SITE_OTHER)
Admission: RE | Admit: 2014-07-14 | Discharge: 2014-07-14 | Disposition: A | Discharge status: Home / Self Care | Payer: Medicaid Other | Source: Ambulatory Visit | Attending: Pulmonary Disease | Admitting: Pulmonary Disease

## 2014-07-14 ENCOUNTER — Ambulatory Visit (INDEPENDENT_AMBULATORY_CARE_PROVIDER_SITE_OTHER): Payer: Medicaid Other | Admitting: Pulmonary Disease

## 2014-07-14 VITALS — BP 148/90 | HR 89 | Ht 66.0 in | Wt 300.0 lb

## 2014-07-14 DIAGNOSIS — R058 Other specified cough: Secondary | ICD-10-CM

## 2014-07-14 DIAGNOSIS — R0600 Dyspnea, unspecified: Secondary | ICD-10-CM | POA: Diagnosis not present

## 2014-07-14 DIAGNOSIS — D860 Sarcoidosis of lung: Secondary | ICD-10-CM | POA: Diagnosis not present

## 2014-07-14 DIAGNOSIS — J4520 Mild intermittent asthma, uncomplicated: Secondary | ICD-10-CM

## 2014-07-14 DIAGNOSIS — R05 Cough: Secondary | ICD-10-CM | POA: Diagnosis not present

## 2014-07-14 MED ORDER — ALBUTEROL SULFATE HFA 108 (90 BASE) MCG/ACT IN AERS: 1.0000 | INHALATION_SPRAY | Freq: Four times a day (QID) | RESPIRATORY_TRACT | Status: AC | PRN

## 2014-07-14 MED ORDER — TRIAMCINOLONE ACETONIDE 55 MCG/ACT NA AERO: 2.0000 | INHALATION_SPRAY | Freq: Every day | NASAL | Status: DC

## 2014-07-14 NOTE — Patient Instructions (Signed)
Chest xray today Nasal irrigation (saline nasal rinse) daily for one week, then as needed Nasacort two sprays each nostril daily for one week, then as needed Albuterol two puffs up to four times per day as needed

## 2014-07-14 NOTE — Progress Notes (Signed)
Chief Complaint  Patient presents with  . Follow-up    SOB has been fair, pt reports increased cough with little mucus.     History of Present Illness: Anna [name] is a 49 y.o. female with sarcoid with pulmonary and ocular invovlement, mild/intermittent asthma, and mild OSA.  She has noticed more coughing.  This is worse at night.  She is not bringing up sputum much.  She has nasal congestion and post nasal drip.  She denies chest pain, fever, sweats or wheeze.  She still gets winded going up stairs.  She has moved into a new home, and this has helped.  She is still in the process of applying for insurance >> as a result she has not been able to f/u with eye doctor.  She is using 10 mg prednisone per day.  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  Physical Exam: Blood pressure 148/90, pulse 89, height 5\' 6"  (1.676 m), weight 300 lb (136.079 kg), SpO2 99 %. Body mass index is 48.44 kg/(m^2).  General - No distress ENT - No sinus tenderness, no oral exudate, no LAN Cardiac - s1s2 regular, no murmur Chest - no wheeze/rales Back - No focal tenderness Abd - Soft, non-tender Ext - No edema Neuro - Normal strength Skin - No rashes Psych - normal mood, and  behavior  Assessment/Plan:  Dyspnea. Multifactorial, related to sarcoidosis, intermittent asthma, diastolic dysfx, and obesity/deconditioning Plan: - encouraged her to maintain her exercise  Pulmonary sarcoidosis. Plan: - continue prednisone 10 mg daily - repeat CXR today - she would need additional testing with f/u CT chest and PFT >> defer for now due to expense given her lack of insurance coverage  Upper airway cough syndrome with post-nasal drip. Plan: - nasal irrigation, nasacort  Mild, intermittent asthma. 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 Plan: - she was to have f/u Echo with cardiology in August 2015 >> will need to discuss with her at next visit whether this was done   Coralyn HellingVineet Cylus Douville, MD [facility]/Critical Care/Sleep Pager:  [phone] 07/14/2014, 1:46 PM

## 2014-07-19 ENCOUNTER — Telehealth: Payer: Self-pay | Admitting: Pulmonary Disease

## 2014-07-19 NOTE — Telephone Encounter (Signed)
Pt requesting cxr results - Dr. Craige CottaSood, pls advise.  Thank you.  Note:  Pt would like call back today if possible.

## 2014-07-20 MED ORDER — PREDNISONE 10 MG PO TABS: 10.0000 mg | ORAL_TABLET | Freq: Every day | ORAL | Status: DC

## 2014-07-20 NOTE — Telephone Encounter (Signed)
Dg Chest 2 View  07/14/2014   CLINICAL DATA:  Sarcoid.  EXAM: CHEST  2 VIEW  COMPARISON:  CT 12/24/2013.  Chest x-ray 12/22/2013.  FINDINGS: Mediastinum hilar stool is again noted consistent adenopathy. No evidence of progression. Persistent atelectasis and/or infiltrate right middle lobe. Stable pulmonary nodular densities. No pleural effusion or pneumothorax. Stable cardiomegaly. No acute bony abnormality. No focal new infiltrates are noted.  IMPRESSION: 1. Stable mediastinal and hilar adenopathy consistent the patient's known diagnosis of sarcoid. 2. Stable right middle lobe atelectasis and/or infiltrate. Stable pulmonary nodular densities. No evidence of progressive disease. 3. Stable cardiomegaly.  No CHF.   Electronically Signed   By: Maisie Fushomas  Register   On: 07/14/2014 15:24    Discussed results with pt.  Advised her to continue prednisone 10 mg daily.  She had abnormal echo in December 2014.  She was to have f/u Echo in August of 2015 >> has not been done.  Advised her to f/u with cardiology to determine if this needs to be repeated.

## 2014-07-21 ENCOUNTER — Telehealth: Payer: Self-pay

## 2014-07-21 ENCOUNTER — Ambulatory Visit: Payer: Self-pay | Admitting: Pulmonary Disease

## 2014-07-21 NOTE — Telephone Encounter (Signed)
Patient called to inform her that echo [name] Patient has appointment with Dr. Daleen SquibbWall on 08/04/14 at 0945.  Patient repeated back appointment times and appreciative of call.

## 2014-07-28 ENCOUNTER — Ambulatory Visit (HOSPITAL_COMMUNITY)
Admission: RE | Admit: 2014-07-28 | Discharge: 2014-07-28 | Disposition: A | Discharge status: Home / Self Care | Payer: Medicaid Other | Source: Ambulatory Visit | Attending: Cardiology | Admitting: Cardiology

## 2014-07-28 DIAGNOSIS — R072 Precordial pain: Secondary | ICD-10-CM | POA: Diagnosis not present

## 2014-07-28 DIAGNOSIS — R079 Chest pain, unspecified: Secondary | ICD-10-CM | POA: Diagnosis present

## 2014-07-28 DIAGNOSIS — R0789 Other chest pain: Secondary | ICD-10-CM

## 2014-07-28 NOTE — Progress Notes (Signed)
  Echocardiogram 2D Echocardiogram has been performed.  Arvil ChacoFoster, Kashae Carstens 07/28/2014, 3:52 PM

## 2014-07-29 ENCOUNTER — Emergency Department (HOSPITAL_COMMUNITY): Payer: Medicaid Other

## 2014-07-29 ENCOUNTER — Telehealth: Payer: Self-pay | Admitting: Pulmonary Disease

## 2014-07-29 ENCOUNTER — Encounter (HOSPITAL_COMMUNITY): Payer: Self-pay

## 2014-07-29 ENCOUNTER — Other Ambulatory Visit: Payer: Self-pay

## 2014-07-29 ENCOUNTER — Emergency Department (HOSPITAL_COMMUNITY)
Admission: EM | Admit: 2014-07-29 | Discharge: 2014-07-29 | Disposition: A | Discharge status: Home / Self Care | Payer: Medicaid Other | Attending: Emergency Medicine | Admitting: Emergency Medicine

## 2014-07-29 DIAGNOSIS — Z79899 Other long term (current) drug therapy: Secondary | ICD-10-CM | POA: Diagnosis not present

## 2014-07-29 DIAGNOSIS — H409 Unspecified glaucoma: Secondary | ICD-10-CM | POA: Insufficient documentation

## 2014-07-29 DIAGNOSIS — R05 Cough: Secondary | ICD-10-CM | POA: Diagnosis not present

## 2014-07-29 DIAGNOSIS — J45901 Unspecified asthma with (acute) exacerbation: Secondary | ICD-10-CM | POA: Insufficient documentation

## 2014-07-29 DIAGNOSIS — Z8719 Personal history of other diseases of the digestive system: Secondary | ICD-10-CM | POA: Diagnosis not present

## 2014-07-29 DIAGNOSIS — Z87891 Personal history of nicotine dependence: Secondary | ICD-10-CM | POA: Insufficient documentation

## 2014-07-29 DIAGNOSIS — Z862 Personal history of diseases of the blood and blood-forming organs and certain disorders involving the immune mechanism: Secondary | ICD-10-CM | POA: Insufficient documentation

## 2014-07-29 DIAGNOSIS — Z7951 Long term (current) use of inhaled steroids: Secondary | ICD-10-CM | POA: Insufficient documentation

## 2014-07-29 DIAGNOSIS — Z7952 Long term (current) use of systemic steroids: Secondary | ICD-10-CM | POA: Diagnosis not present

## 2014-07-29 DIAGNOSIS — R079 Chest pain, unspecified: Secondary | ICD-10-CM | POA: Insufficient documentation

## 2014-07-29 DIAGNOSIS — D869 Sarcoidosis, unspecified: Secondary | ICD-10-CM

## 2014-07-29 DIAGNOSIS — R0602 Shortness of breath: Secondary | ICD-10-CM | POA: Diagnosis present

## 2014-07-29 LAB — I-STAT TROPONIN, ED: Troponin i, poc: 0.01 ng/mL (ref 0.00–0.08)

## 2014-07-29 LAB — CBC WITH DIFFERENTIAL/PLATELET
Basophils Absolute: 0 10*3/uL (ref 0.0–0.1)
Basophils Relative: 0 % (ref 0–1)
EOS ABS: 0.1 10*3/uL (ref 0.0–0.7)
Eosinophils Relative: 1 % (ref 0–5)
HCT: 36 % (ref 36.0–46.0)
Hemoglobin: 11.6 g/dL — ABNORMAL LOW (ref 12.0–15.0)
LYMPHS ABS: 1.6 10*3/uL (ref 0.7–4.0)
Lymphocytes Relative: 22 % (ref 12–46)
MCH: 25.2 pg — ABNORMAL LOW (ref 26.0–34.0)
MCHC: 32.2 g/dL (ref 30.0–36.0)
MCV: 78.1 fL (ref 78.0–100.0)
Monocytes Absolute: 0.6 10*3/uL (ref 0.1–1.0)
Monocytes Relative: 9 % (ref 3–12)
NEUTROS ABS: 4.9 10*3/uL (ref 1.7–7.7)
NEUTROS PCT: 68 % (ref 43–77)
Platelets: 235 10*3/uL (ref 150–400)
RBC: 4.61 MIL/uL (ref 3.87–5.11)
RDW: 14.3 % (ref 11.5–15.5)
WBC: 7.2 10*3/uL (ref 4.0–10.5)

## 2014-07-29 LAB — BASIC METABOLIC PANEL
Anion gap: 8 (ref 5–15)
BUN: 10 mg/dL (ref 6–23)
CHLORIDE: 103 mmol/L (ref 96–112)
CO2: 26 mmol/L (ref 19–32)
Calcium: 8.4 mg/dL (ref 8.4–10.5)
Creatinine, Ser: 0.97 mg/dL (ref 0.50–1.10)
GFR calc Af Amer: 79 mL/min — ABNORMAL LOW (ref >90)
GFR calc non Af Amer: 68 mL/min — ABNORMAL LOW (ref >90)
Glucose, Bld: 88 mg/dL (ref 70–99)
Potassium: 3.7 mmol/L (ref 3.5–5.1)
Sodium: 137 mmol/L (ref 135–145)

## 2014-07-29 LAB — BRAIN NATRIURETIC PEPTIDE: B NATRIURETIC PEPTIDE 5: 94.9 pg/mL (ref 0.0–100.0)

## 2014-07-29 MED ORDER — ONDANSETRON 4 MG PO TBDP: 4.0000 mg | ORAL_TABLET | Freq: Three times a day (TID) | ORAL | Status: DC | PRN

## 2014-07-29 MED ORDER — ALBUTEROL SULFATE (2.5 MG/3ML) 0.083% IN NEBU
5.0000 mg | INHALATION_SOLUTION | Freq: Once | RESPIRATORY_TRACT | Status: AC
  Administered 2014-07-29: 5 mg via RESPIRATORY_TRACT
  Filled 2014-07-29: qty 6

## 2014-07-29 MED ORDER — HYDROCODONE-ACETAMINOPHEN 5-325 MG PO TABS: 1.0000 | ORAL_TABLET | ORAL | Status: DC | PRN

## 2014-07-29 NOTE — ED Notes (Signed)
Pt. States she has a pain on R side. Increased SOB. Pt. States she has been coughing. Pt. Denies fevers.

## 2014-07-29 NOTE — ED Notes (Signed)
Patient transported to X-ray 

## 2014-07-29 NOTE — ED Notes (Signed)
Ambulated Patient with 02 at room air.  Patient 02 stats did not go below 98, HR 94.  Patient did very well.  Walked around POD B.

## 2014-07-29 NOTE — ED Provider Notes (Signed)
CSN: [id number]     Arrival date & time 07/29/14  0749 History   First MD Initiated Contact with Patient 07/29/14 0750     Chief Complaint  Patient presents with  . Shortness of Breath     (Consider location/radiation/quality/duration/timing/severity/associated sxs/prior Treatment) Patient is a 49 y.o. female presenting with shortness of breath. The history is provided by the patient and medical records.  Shortness of Breath Associated symptoms: chest pain and cough     This is a 49 year old female with past medical history significant for asthma, obstructive sleep apnea, pulmonary sarcoidosis with ocular involvement, presenting to the ED for shortness of breath. Patient states she has some variant of shortness of breath on a daily basis, but this is increased from her baseline. She notes a dry cough but denies fevers or chills. She is followed regularly by pulmonology, Dr. Craige Cotta.  States she had an outpatient echocardiogram performed yesterday to determine if she needed to continue to follow with cardiology (Dr. Daleen Squibb). EF on yesterday's 2D echo was estimated at 50-55%.  States she has having some pain in the right side of her chest at the lower lateral ribs which she describes as sharp and stabbing without radiation.  Pain was unrelieved by over-the-counter Tylenol this morning.  Past Medical History  Diagnosis Date  . Pulmonary sarcoidosis     and ocular.  biopsy 10/00, PFT 10/29/08 FEV1 2.24(77%), FVC 2.78(74%), TLC 3.72(68%), DLCO 61%, no BD response  . Vocal cord polyp     with hoarseness.  Dr. Altamese Cabal  . GERD (gastroesophageal reflux disease)   . OSA (obstructive sleep apnea)     PSG 11/03/05 RDI 6  . Obesity   . Mild intermittent asthma   . Rhinitis   . Glaucoma    Past Surgical History  Procedure Laterality Date  . Fracture surgery    . Bronchoscopy    . Dental surgery    . Video bronchoscopy with endobronchial ultrasound N/A 01/12/2014    Procedure: VIDEO BRONCHOSCOPY WITH  ENDOBRONCHIAL ULTRASOUND;  Surgeon: Lonia Farber, MD;  Location: [facility] OR;  Service: Pulmonary;  Laterality: N/A;   No family history on file. History  Substance Use Topics  . Smoking status: Former Smoker -- 1.50 packs/day for 10 years    Types: Cigarettes    Quit date: 07/02/1997  . Smokeless tobacco: Never Used  . Alcohol Use: No   OB History    No data available     Review of Systems  Respiratory: Positive for cough and shortness of breath.   Cardiovascular: Positive for chest pain.  All other systems reviewed and are negative.     Allergies  Review of patient's allergies indicates no known allergies.  Home Medications   Prior to Admission medications   Medication Sig Start Date End Date Taking? Authorizing Provider  albuterol (PROVENTIL HFA;VENTOLIN HFA) 108 (90 BASE) MCG/ACT inhaler Inhale 1-2 puffs into the lungs every 6 (six) hours as needed for wheezing or shortness of breath. 07/14/14   [name], MD  latanoprost (XALATAN) 0.005 % ophthalmic solution Place 1 drop into the right eye at bedtime.    Historical Provider, MD  predniSONE (DELTASONE) 10 MG tablet Take 1 tablet (10 mg total) by mouth daily. 07/20/14   [name], MD  triamcinolone (NASACORT AQ) 55 MCG/ACT AERO nasal inhaler Place 2 sprays into the nose daily. 07/14/14   [name], MD   BP 114/98 mmHg  Pulse 81  Resp 12  Ht  (1.676  m)  Wt 300 lb (136.079 kg)  BMI 48.44 kg/m2  SpO2 98%  LMP 06/11/2014   Physical Exam  Constitutional: She is oriented to person, place, and time. She appears well-developed and well-nourished. No distress.  Morbidly obese  HENT:  Head: Normocephalic and atraumatic.  Mouth/Throat: Oropharynx is clear and moist.  Eyes: Conjunctivae and EOM are normal. Pupils are equal, round, and reactive to light.  Neck: Normal range of motion. Neck supple.  Cardiovascular: Normal rate, regular rhythm and normal heart sounds.   Pulmonary/Chest: Effort normal. No  respiratory distress. She has wheezes. She has no rhonchi. She has no rales.  Respirations unlabored, diffuse wheezes noted- more pronounced on right side; O2 sats 99% on RA, speaking in full complete sentences without difficulty  Abdominal: Soft. Bowel sounds are normal. There is no tenderness. There is no guarding.  Musculoskeletal: Normal range of motion.  No calf asymmetry, tenderness, or palpable cords No pitting edema  Neurological: She is alert and oriented to person, place, and time.  Skin: Skin is warm and dry. She is not diaphoretic.  Psychiatric: She has a normal mood and affect.  Nursing note and vitals reviewed.   ED Course  Procedures (including critical care time) Labs Review Labs Reviewed  CBC WITH DIFFERENTIAL/PLATELET - Abnormal; Notable for the following:    Hemoglobin 11.6 (*)    MCH 25.2 (*)    All other components within normal limits  BASIC METABOLIC PANEL - Abnormal; Notable for the following:    GFR calc non Af Amer 68 (*)    GFR calc Af Amer 79 (*)    All other components within normal limits  BRAIN NATRIURETIC PEPTIDE  I-STAT TROPOININ, ED    Imaging Review Dg Chest 2 View  07/29/2014   CLINICAL DATA:  Right chest pain and cough since yesterday. History of asthma. History of sarcoidosis.  EXAM: CHEST  2 VIEW  COMPARISON:  Radiograph 07/14/2014 and CT from 12/24/2013  FINDINGS: Again noted are subtle pulmonary nodules, best seen in the left upper lung. There is stable fullness in the right hilum consistent with known lymphadenopathy. Stable appearance of the heart and mediastinum. Negative for pleural effusions. Again noted are densities in the right middle lobe region that have slightly progressed. Otherwise, no focal airspace disease.  IMPRESSION: Slightly increased densities in the right middle lobe. Patient has chronic volume loss in this area but there may be increased atelectasis.  Lymphadenopathy and pulmonary nodules compatible with history of  sarcoidosis.   Electronically Signed   By: Richarda OverlieAdam  Henn M.D.   On: 07/29/2014 08:39     EKG Interpretation   Date/Time:  Thursday July 29 2014 08:03:33 EST Ventricular Rate:  89 PR Interval:  164 QRS Duration: 92 QT Interval:  402 QTC Calculation: 489 R Axis:   -14 Text Interpretation:  Sinus rhythm Probable left atrial enlargement ST  elev, probable normal early repol pattern Borderline prolonged QT interval  No significant change since last tracing Confirmed by POLLINA  MD,  CHRISTOPHER [phone]([id number]) on 07/29/2014 8:06:59 AM      MDM   Final diagnoses:  Shortness of breath  Chest pain, unspecified chest pain type   49 year old female with shortness of breath and pain in right lateral ribs. On exam, patient afebrile and nontoxic in appearance. She does have diffuse wheezes, more pronounced in right lung, but no respiratory distress. EKG sinus rhythm without ischemic changes.  Lab work is reassuring. Chest x-ray with increased atelectasis in right middle lobe,  new from 07/14/14.  Patient given albuterol treatment with improvement of wheezes.  Patient ambulated around ED and maintained O2 sats of 98-100%, HR 94.  She remains without acute respiratory distress.  No clinical signs of DVT/PE, patient is PERC negative.  Low suspicion for ACS, dissection, or other cardiac event at this time.  Patient will be d/c home with short supply percocet and zofran for pain control, continue prednisone and albuterol at home.  Patient to FU with Dr. Craige Cotta.  Discussed plan with patient, he/she acknowledged understanding and agreed with plan of care.  Return precautions given for new or worsening symptoms.  Garlon Hatchet, PA-C 07/29/14 1242  Gilda Crease, MD 07/30/14 [phone]

## 2014-07-29 NOTE — Telephone Encounter (Signed)
Spoke with pt, states that she was seen in ED for chest pain/SOB. Pt was D/C today. Had CXR today and was advised that there was some "progression" in her sarcoid on the scan and needed to be addressed by Pulmonary MD. I advised the patient that Dr Craige CottaSood is working late nights in the hospital this week and that I will send this to him to make him aware; but in the meantime I am going to have one of our on call doctors look at scan and give any preliminary rec's they can offer since VS is not available. Pt states that she is having a lot of chest/lung pain mainly on the right side right under and behind her right breast. Pt reports having some increased SOB and cough. Pt states that if she were to rate her pain on a level of 1-10 she would rate it a 15, which shows exactly how much pain she is in. Pt would like her cxr from 07/14/14 and 07/29/14 compared.   Please advise Dr Maple HudsonYoung. Thanks.

## 2014-07-29 NOTE — Progress Notes (Signed)
[facility] Specialist  Patient is a current orange card holder and [facility]. Patient states she has an upcoming appointment with her pcp (08/04/14). Orange card education provided. My contact information provided for any future questions or concerns. No other needs identified at this time.

## 2014-07-29 NOTE — Telephone Encounter (Signed)
Per CDY we can call in pred 20 mg QD #7 and she to call the office next week Have lab work done ace level and d-dimer DX sarcoid.   Called spoke with pt and is aware of recs. She does not want the prednisone called in since she does have some at home. She will take 20 mg daily x 7 days and call us next week. She will have blood work done and aware we will also make VS aware. Nothing further needed

## 2014-07-29 NOTE — Discharge Instructions (Signed)
Take the prescribed medication as directed. Follow-up with Dr. Craige CottaSood-- i would call his office today to try to get an appt for next week.  Have him review your chest x-ray from today at your appt. Return to the ED for new or worsening symptoms.

## 2014-07-30 ENCOUNTER — Telehealth: Payer: Self-pay | Admitting: Internal Medicine

## 2014-07-30 LAB — D-DIMER, QUANTITATIVE (NOT AT ARMC): D DIMER QUANT: 0.51 ug{FEU}/mL — AB (ref 0.00–0.48)

## 2014-07-30 LAB — ANGIOTENSIN CONVERTING ENZYME: Angiotensin-Converting Enzyme: 42 U/L (ref 8–52)

## 2014-07-30 NOTE — Telephone Encounter (Signed)
Pt called answering service.  She was still having some pain.  She had ACE and d dimer done yesterday >> results still pending.  She has not started higher dose of prednisone yet and did not fill script for pain medicine yet from ER.    She reports that some of her symptoms improved after albuterol nebulizer treatment in ER.  I reviewed her CXR >> might be very slight change in RML ATX, otherwise no change.  Explained her symptoms could be related to asthma exacerbation >> she reports 2nd hand tobacco exposure in her apartment, and mold in her apartment.  She will be moving soon.  Advised her to continue with plan for prednisone 20 mg daily for 7 days and then decrease to baseline dose, continue albuterol prn, and use OTC pain medications first before narcotic medications.  Will call her back once lab tests available.    Depending on test results/response to tx she might need CT chest with contrast.  She thinks her medicaid application will be approved soon, so financial constraints should be removed for proceeding with additional testing.

## 2014-07-30 NOTE — Telephone Encounter (Signed)
Labs under CY came from phone note for acute issues when VS not in office; patient was made aware of labs and no further questions or concerns.

## 2014-08-03 ENCOUNTER — Telehealth: Payer: Self-pay | Admitting: Pulmonary Disease

## 2014-08-03 DIAGNOSIS — R079 Chest pain, unspecified: Secondary | ICD-10-CM

## 2014-08-03 DIAGNOSIS — D860 Sarcoidosis of lung: Secondary | ICD-10-CM

## 2014-08-03 NOTE — Telephone Encounter (Signed)
Pt states that she is still having pain under right breast area at a pain level of 8-10.  Pt is still on Prednisone 20 mg/day and has been taking ES Tylenol for the pain every 4 hours .  C/o hurts to cough, sob , diff lying flat due to pain.  Pt has appt with Dr Daleen SquibbWall tomorrow.  Advised to go ahead and have rx for Hydrocodone filled so that she can have that for break through pain if Tylenol is not helping.  She states she is willing to have Chest CT done now if Dr Craige CottaSood still wants this done because she cannot handle pain any longer.  Please advise.

## 2014-08-03 NOTE — Telephone Encounter (Signed)
Please arrange for CT chest with IV contrast.  Use dx code for chest pain, and pulmonary sarcoidosis.

## 2014-08-03 NOTE — Telephone Encounter (Signed)
Pt is aware of VS's recommendation. Order will be placed for this.

## 2014-08-04 ENCOUNTER — Encounter: Payer: Self-pay | Admitting: Cardiology

## 2014-08-04 ENCOUNTER — Ambulatory Visit: Payer: Medicaid Other | Attending: Cardiology | Admitting: Cardiology

## 2014-08-04 VITALS — BP 186/82 | HR 80 | Temp 98.1°F | Ht 66.0 in | Wt 306.8 lb

## 2014-08-04 DIAGNOSIS — R931 Abnormal findings on diagnostic imaging of heart and coronary circulation: Secondary | ICD-10-CM | POA: Diagnosis not present

## 2014-08-04 DIAGNOSIS — D860 Sarcoidosis of lung: Secondary | ICD-10-CM | POA: Diagnosis not present

## 2014-08-04 NOTE — Progress Notes (Signed)
Patient here for follow-up for aneurysm of the heart.  Patient complain of having increased SOB for 6 days. SOB is worse with activity. Pt states she went  To the ER on 07-29-14 for increased SOB and was treated with a breathing treatment and prednisone 20mg  x 7 days.  Pt states she does not feel SOB is improved. Pt denies any chest pain.

## 2014-08-04 NOTE — Assessment & Plan Note (Signed)
Her echocardiogram is stable. Apical Karizma Cheek motion abnormality still seen but no definite aneurysm. Patient reassured. Her pulmonary sarcoidosis is the biggest issue. Continue to follow up with them. I will see back for follow-up in one year.

## 2014-08-04 NOTE — Progress Notes (Signed)
Patient had appointment on 08/04/14 and Dr. Daleen SquibbWall discussed results of echo.

## 2014-08-04 NOTE — Patient Instructions (Signed)
Continue your medications as directed.  Follow-up with Dr. Daleen SquibbWall in 1 year, call us anytime if needed.

## 2014-08-04 NOTE — Progress Notes (Signed)
Anna [name] returns today discussed the findings of her echocardiogram. It is stable with an EF of 50-55%. There were continued Shelena Castelluccio motion abnormalities but an  apical aneurysm was not seen. Valvular function was stable. There was no pericardial effusion.  She's having a lot of problems with her pulmonary sarcoidosis being managed by Dr.Sood. She continues with steroids. She had right thoracic pain that took her to the emergency room recently.  Examination was not done today. All questions answered.

## 2014-08-09 ENCOUNTER — Ambulatory Visit (INDEPENDENT_AMBULATORY_CARE_PROVIDER_SITE_OTHER)
Admission: RE | Admit: 2014-08-09 | Discharge: 2014-08-09 | Disposition: A | Discharge status: Home / Self Care | Payer: Medicaid Other | Source: Ambulatory Visit | Attending: Pulmonary Disease | Admitting: Pulmonary Disease

## 2014-08-09 DIAGNOSIS — R079 Chest pain, unspecified: Secondary | ICD-10-CM | POA: Diagnosis not present

## 2014-08-09 DIAGNOSIS — D860 Sarcoidosis of lung: Secondary | ICD-10-CM | POA: Diagnosis not present

## 2014-08-09 MED ORDER — IOHEXOL 300 MG/ML  SOLN
80.0000 mL | Freq: Once | INTRAMUSCULAR | Status: AC | PRN
  Administered 2014-08-09: 80 mL via INTRAVENOUS

## 2014-08-10 ENCOUNTER — Telehealth: Payer: Self-pay | Admitting: Pulmonary Disease

## 2014-08-10 NOTE — Telephone Encounter (Signed)
CT chest 08/09/14 >> no change hilar/mediastinal LAN, no change RML ATX, scattered b/l sub-cm nodules  Results d/w pt.  No significant change.    She is to remain on prednisone 10 mg daily.  She continues to have chest discomfort, but improved from before.  Not sure this is related to her pulmonary findings.  She was seen by cardiology >> she needs ROV in 1 year.  Will have my nurse schedule her for ROV in 1 month.

## 2014-08-16 NOTE — Telephone Encounter (Signed)
[phone], patient Anna [name]

## 2014-08-16 NOTE — Telephone Encounter (Signed)
Left detailed message on machine x 1

## 2014-08-16 NOTE — Telephone Encounter (Signed)
VS doesn't have an opening in 1 month.  Pt aware sending to [place] to advise where she can be worked in.  Please advise.  Thank you.

## 2014-08-17 NOTE — Telephone Encounter (Signed)
Pt scheduled for OV with VS 09/13/14 at 3:30 (had to double book - pt refused to see TP) Nothing further needed.

## 2014-08-18 NOTE — Telephone Encounter (Signed)
Noted  

## 2014-09-13 ENCOUNTER — Encounter: Payer: Self-pay | Admitting: Pulmonary Disease

## 2014-09-13 ENCOUNTER — Ambulatory Visit (INDEPENDENT_AMBULATORY_CARE_PROVIDER_SITE_OTHER): Payer: Medicaid Other | Admitting: Pulmonary Disease

## 2014-09-13 VITALS — BP 118/84 | HR 95 | Temp 97.6°F | Ht 66.0 in | Wt 309.0 lb

## 2014-09-13 DIAGNOSIS — D860 Sarcoidosis of lung: Secondary | ICD-10-CM | POA: Diagnosis not present

## 2014-09-13 DIAGNOSIS — R05 Cough: Secondary | ICD-10-CM | POA: Diagnosis not present

## 2014-09-13 DIAGNOSIS — R058 Other specified cough: Secondary | ICD-10-CM

## 2014-09-13 NOTE — Progress Notes (Signed)
Chief Complaint  Patient presents with  . Follow-up    Pt has constant cough with some blood Fri,Sat, Sun. She has wheezing sob nothing alarming.    History of Present Illness: Anna [name] is a 49 y.o. female with sarcoid with pulmonary and ocular invovlement, mild/intermittent asthma, and mild OSA.  She was having episodes of cough with sputum and blood mixed in.  This is better.  She is not have pain in her chest like before.  She is getting more sinus congestion and runny nose.  She denies fever.  She has been using albuterol more, and this helps some.  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  Physical Exam: Blood pressure 118/84, pulse 95, temperature 97.6 F (36.4 C), temperature source Oral, height 5\' 6"  (1.676 m), weight 309 lb (140.161 kg), SpO2 99 %. Body mass index is 49.9 kg/(m^2).  General - No distress ENT - No sinus tenderness, no oral exudate, no LAN Cardiac - s1s2 regular, no murmur Chest - no wheeze/rales Back - No focal tenderness Abd - Soft, non-tender Ext  - No edema Neuro - Normal strength Skin - No rashes Psych - normal mood, and behavior  Assessment/Plan:  Dyspnea. Multifactorial, related to sarcoidosis, intermittent asthma, diastolic dysfx, and obesity/deconditioning Plan: - encouraged her to maintain her exercise  Pulmonary sarcoidosis. Plan: - change to 5 mg prednisone daily >> she is to call to update status after change - consider repeating PFTs at next visit  Upper airway cough syndrome with post-nasal drip. Plan: - nasal irrigation, nasacort - advised her to use benadryl at night to help with allergies, and this will also help some with sleep  Mild, intermittent asthma. Plan: - she can try using albuterol as needed  Mild/mod MR on Echo. Plan: - f/u with cardiology  Coralyn HellingVineet Arlene Brickel, MD [facility] Pager:  [phone] 09/13/2014, 3:49 PM

## 2014-09-13 NOTE — Patient Instructions (Signed)
Change prednisone to 5 mg daily >> call in two weeks to update status Use nasacort two sprays in each nostril daily Can try using benadryl 50 mg nightly as needed for sinus congestion Follow up in 2 months

## 2014-09-28 ENCOUNTER — Telehealth: Payer: Self-pay | Admitting: Pulmonary Disease

## 2014-09-28 NOTE — Telephone Encounter (Signed)
Spoke with the pt  She states that she has been doing well with taking 5 mg pred daily  She states that Dr Craige CottaSood advised her if she was doing well she could drop down to 1.5 mg daily  I advised will need to double check with with him and call her back  She verbalized understanding  Please advise, thanks!

## 2014-09-28 NOTE — Telephone Encounter (Signed)
LMTCB

## 2014-09-29 NOTE — Telephone Encounter (Signed)
She is supposed to change to prednisone 2.5 mg daily.  Please send new script for 2.5 mg of prednisone, 1 pill daily, dispense 30 with 2 refills.

## 2014-09-29 NOTE — Telephone Encounter (Signed)
Pt is aware of what dosage she should take. States that she has plenty of 5mg  tablets that she can half. Nothing further was needed.

## 2014-09-29 NOTE — Telephone Encounter (Signed)
lmtcb x1 

## 2014-10-06 ENCOUNTER — Institutional Professional Consult (permissible substitution): Payer: Self-pay | Admitting: Pulmonary Disease

## 2014-11-15 ENCOUNTER — Encounter: Payer: Self-pay | Admitting: Pulmonary Disease

## 2014-11-15 ENCOUNTER — Ambulatory Visit (INDEPENDENT_AMBULATORY_CARE_PROVIDER_SITE_OTHER)
Admission: RE | Admit: 2014-11-15 | Discharge: 2014-11-15 | Disposition: A | Discharge status: Home / Self Care | Payer: Medicaid Other | Source: Ambulatory Visit | Attending: Pulmonary Disease | Admitting: Pulmonary Disease

## 2014-11-15 ENCOUNTER — Ambulatory Visit (INDEPENDENT_AMBULATORY_CARE_PROVIDER_SITE_OTHER): Payer: Medicaid Other | Admitting: Pulmonary Disease

## 2014-11-15 VITALS — BP 150/92 | HR 82 | Ht 66.0 in | Wt 300.0 lb

## 2014-11-15 DIAGNOSIS — D860 Sarcoidosis of lung: Secondary | ICD-10-CM | POA: Diagnosis not present

## 2014-11-15 DIAGNOSIS — J9819 Other pulmonary collapse: Secondary | ICD-10-CM | POA: Diagnosis not present

## 2014-11-15 DIAGNOSIS — J4520 Mild intermittent asthma, uncomplicated: Secondary | ICD-10-CM

## 2014-11-15 MED ORDER — FLUTICASONE-SALMETEROL 250-50 MCG/DOSE IN AEPB: 1.0000 | INHALATION_SPRAY | Freq: Two times a day (BID) | RESPIRATORY_TRACT | Status: DC

## 2014-11-15 NOTE — Patient Instructions (Signed)
Advair 1 puff twice per day Chest xray today Follow up with Dr. Craige CottaSood or Tammy Parrett in 2 weeks

## 2014-11-15 NOTE — Progress Notes (Signed)
Chief Complaint  Patient presents with  . Follow-up    Pt reports some improvement since last seen 08/2014. Pt still having cough and wheezing.     History of Present Illness: Anna [name] is a 49 y.o. female with sarcoid with pulmonary and ocular invovlement, mild/intermittent asthma, and mild OSA.  She has been off prednisone for the past few weeks.  She has been getting intermittent cough >> usually dry.  She has some wheezing also.  She denies sinus congestion, post nasal drip, fever, or rash.  She is not getting chest pain like she did before.  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  Physical Exam: Blood pressure 150/92, pulse 82, height 5\' 6"  (1.676 m), weight 300 lb (136.079 kg), SpO2 97 %. Body mass index is 48.44 kg/(m^2).  General - No distress ENT - No sinus tenderness, no oral exudate, no LAN Cardiac - s1s2 regular, no murmur Chest - expiratory wheeze Rt > Lt  Back - No focal tenderness Abd - Soft, non-tender Ext - No edema Neuro - Normal strength Skin - No  rashes Psych - normal mood, and behavior  Assessment/Plan:  Dyspnea. Multifactorial, related to sarcoidosis, intermittent asthma, diastolic dysfx, and obesity/deconditioning Plan: - symptoms worse since of prednisone ?from asthma vs progression of sarcoidosis  Pulmonary sarcoidosis. Plan: - will check CXR and determine if she should resume prednisone - will need PFTs when more stable  Upper airway cough syndrome with post-nasal drip. Plan: - nasal irrigation, nasacort  Persistent asthma. I am concerned her current symptoms might be related to asthma. 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 Plan: - f/u with cardiology   Coralyn HellingVineet Arland Usery, MD [place] [facility]Critical [facility]/Sleep Pager:  [phone] 11/15/2014, 11:57 AM

## 2014-11-16 ENCOUNTER — Telehealth: Payer: Self-pay | Admitting: Pulmonary Disease

## 2014-11-16 NOTE — Telephone Encounter (Signed)
Dg Chest 2 View  11/15/2014   CLINICAL DATA:  Worsening scratch the increasing cough and wheezing. History of sarcoidosis.  EXAM: CHEST  2 VIEW  COMPARISON:  PA and lateral chest 07/29/2014 and 12/22/2013. CT chest 08/09/2014.  FINDINGS: Bilateral upper lobe pulmonary nodules are unchanged. Chronic right middle lobe by atelectasis is also stable in appearance. The lungs are otherwise clear. Heart size is upper normal. Mediastinal and hilar lymphadenopathy is noted.  IMPRESSION: No acute disease.  Mediastinal lymphadenopathy and unchanged pulmonary nodules compatible with sarcoidosis. Chronic right middle lobe atelectasis is also stable appearance.   Electronically Signed   By: Drusilla Kannerhomas  Dalessio M.D.   On: 11/15/2014 13:49    Left message on pt's voicemail.  No significant change in CXR.  She is to continue inhaler therapy.  Defer restarting prednisone for now.  Advised pt to call back with questions.

## 2014-11-30 ENCOUNTER — Ambulatory Visit: Payer: Medicaid Other | Admitting: Adult Health

## 2014-12-09 ENCOUNTER — Encounter: Payer: Self-pay | Admitting: Internal Medicine

## 2014-12-09 ENCOUNTER — Ambulatory Visit: Payer: Medicaid Other | Attending: Internal Medicine | Admitting: Internal Medicine

## 2014-12-09 VITALS — BP 138/83 | HR 90 | Temp 98.0°F | Resp 16 | Wt 292.0 lb

## 2014-12-09 DIAGNOSIS — D869 Sarcoidosis, unspecified: Secondary | ICD-10-CM | POA: Diagnosis not present

## 2014-12-09 DIAGNOSIS — Z0000 Encounter for general adult medical examination without abnormal findings: Secondary | ICD-10-CM | POA: Diagnosis not present

## 2014-12-09 DIAGNOSIS — R109 Unspecified abdominal pain: Secondary | ICD-10-CM | POA: Insufficient documentation

## 2014-12-09 DIAGNOSIS — R05 Cough: Secondary | ICD-10-CM | POA: Insufficient documentation

## 2014-12-09 DIAGNOSIS — R059 Cough, unspecified: Secondary | ICD-10-CM

## 2014-12-09 LAB — POCT URINALYSIS DIPSTICK
GLUCOSE UA: NEGATIVE
Ketones, UA: NEGATIVE
NITRITE UA: NEGATIVE
PH UA: 5.5
Protein, UA: 30
RBC UA: NEGATIVE
Spec Grav, UA: >=1.03
Urobilinogen, UA: 1

## 2014-12-09 MED ORDER — GUAIFENESIN-CODEINE 100-10 MG/5ML PO SYRP: 5.0000 mL | ORAL_SOLUTION | Freq: Four times a day (QID) | ORAL | Status: DC | PRN

## 2014-12-09 NOTE — Patient Instructions (Addendum)
Come back in 2 weeks for a BP recheck. If elevated I will start you on a low dose BP medication.  May take 600 mg of Ibuprofen, three times per day for muscle pain   DASH Eating Plan DASH stands for "Dietary Approaches to Stop Hypertension." The DASH eating plan is a healthy eating plan that has been shown to reduce high blood pressure (hypertension). Additional health benefits may include reducing the risk of type 2 diabetes mellitus, heart disease, and stroke. The DASH eating plan may also help with weight loss. WHAT DO I NEED TO KNOW ABOUT THE DASH EATING PLAN? For the DASH eating plan, you will follow these general guidelines:  Choose foods with a percent daily value for sodium of less than 5% (as listed on the food label).  Use salt-free seasonings or herbs instead of table salt or sea salt.  Check with your health care provider or pharmacist before using salt substitutes.  Eat lower-sodium products, often labeled as "lower sodium" or "no salt added."  Eat fresh foods.  Eat more vegetables, fruits, and low-fat dairy products.  Choose whole grains. Look for the word "whole" as the first word in the ingredient list.  Choose fish and skinless chicken or [place] more often than red meat. Limit fish, poultry, and meat to 6 oz (170 g) each day.  Limit sweets, desserts, sugars, and sugary drinks.  Choose heart-healthy fats.  Limit cheese to 1 oz (28 g) per day.  Eat more home-cooked food and less restaurant, buffet, and fast food.  Limit fried foods.  Cook foods using methods other than frying.  Limit canned vegetables. If you do use them, rinse them well to decrease the sodium.  When eating at a restaurant, ask that your food be prepared with less salt, or no salt if possible. WHAT FOODS CAN I EAT? Seek help from a dietitian for individual calorie needs. Grains Whole grain or whole wheat bread. Brown rice. Whole grain or whole wheat pasta. Quinoa, bulgur, and whole grain  cereals. Low-sodium cereals. Corn or whole wheat flour tortillas. Whole grain cornbread. Whole grain crackers. Low-sodium crackers. Vegetables Fresh or frozen vegetables (raw, steamed, roasted, or grilled). Low-sodium or reduced-sodium tomato and vegetable juices. Low-sodium or reduced-sodium tomato sauce and paste. Low-sodium or reduced-sodium canned vegetables.  Fruits All fresh, canned (in natural juice), or frozen fruits. Meat and Other Protein Products Ground beef (85% or leaner), grass-fed beef, or beef trimmed of fat. Skinless chicken or [place]. Ground chicken or [place]. Pork trimmed of fat. All fish and seafood. Eggs. Dried beans, peas, or lentils. Unsalted nuts and seeds. Unsalted canned beans. Dairy Low-fat dairy products, such as skim or 1% milk, 2% or reduced-fat cheeses, low-fat ricotta or cottage cheese, or plain low-fat yogurt. Low-sodium or reduced-sodium cheeses. Fats and Oils Tub margarines without trans fats. Light or reduced-fat mayonnaise and salad dressings (reduced sodium). Avocado. Safflower, olive, or canola oils. Natural peanut or almond butter. Other Unsalted popcorn and pretzels. The items listed above may not be a complete list of recommended foods or beverages. Contact your dietitian for more options. WHAT FOODS ARE NOT RECOMMENDED? Grains White bread. White pasta. White rice. Refined cornbread. Bagels and croissants. Crackers that contain trans fat. Vegetables Creamed or fried vegetables. Vegetables in a cheese sauce. Regular canned vegetables. Regular canned tomato sauce and paste. Regular tomato and vegetable juices. Fruits Dried fruits. Canned fruit in light or heavy syrup. Fruit juice. Meat and Other Protein Products Fatty cuts of meat. Ribs, chicken  wings, bacon, sausage, bologna, salami, chitterlings, fatback, hot dogs, bratwurst, and packaged luncheon meats. Salted nuts and seeds. Canned beans with salt. Dairy Whole or 2% milk, cream, half-and-half, and  cream cheese. Whole-fat or sweetened yogurt. Full-fat cheeses or blue cheese. Nondairy creamers and whipped toppings. Processed cheese, cheese spreads, or cheese curds. Condiments Onion and garlic salt, seasoned salt, table salt, and sea salt. Canned and packaged gravies. Worcestershire sauce. Tartar sauce. Barbecue sauce. Teriyaki sauce. Soy sauce, including reduced sodium. Steak sauce. Fish sauce. Oyster sauce. Cocktail sauce. Horseradish. Ketchup and mustard. Meat flavorings and tenderizers. Bouillon cubes. Hot sauce. Tabasco sauce. Marinades. Taco seasonings. Relishes. Fats and Oils Butter, stick margarine, lard, shortening, ghee, and bacon fat. Coconut, palm kernel, or palm oils. Regular salad dressings. Other Pickles and olives. Salted popcorn and pretzels. The items listed above may not be a complete list of foods and beverages to avoid. Contact your dietitian for more information. WHERE CAN I FIND MORE INFORMATION? National Heart, Lung, and Blood Institute: [employer] Document Released: 06/07/2011 Document Revised: 11/02/2013 Document Reviewed: 04/22/2013 [facility] Patient Information 2015 [place], [place]. This information is not intended to replace advice given to you by your health care provider. Make sure you discuss any questions you have with your health care provider.

## 2014-12-09 NOTE — Progress Notes (Signed)
Patient complains of pain to her left side as well as having a cough that  She has had for a while. Patient did states she follows up with her pulmonologist  But states the advair is not helping

## 2014-12-09 NOTE — Progress Notes (Signed)
Patient ID: Anna [name], female   DOB: 03-15-1966, 49 y.o.   MRN: [id number]  CC: cough, side pain   HPI: Anna [name] is a 49 y.o. female here today for a follow up visit.  Patient has past medical history of sarcoidosis and asthma.  She states that she has been having left side pain and cough for several weeks. She was last seen by her pulmonologist one month ago and was started on Advair but she does not feel it is working. She reports that in January she went to the ER for right side pain and was given hydrocodone which provided no relief. Now she notes that the pain has moved to her left side and is similar in character. She denies dysuria, fever, chills, hematuria. She is concerned about coughing up specks of blood. She notes she has coughed up blood in March. Cough is all day and she often has post-tussive vomiting. The side pain is exacerbated by coughing.   Patient has No headache, No chest pain, No abdominal pain, No new weakness tingling or numbness.   No Known Allergies Past Medical History  Diagnosis Date  . Pulmonary sarcoidosis     and ocular.  biopsy 10/00, PFT 10/29/08 FEV1 2.24(77%), FVC 2.78(74%), TLC 3.72(68%), DLCO 61%, no BD response  . Vocal cord polyp     with hoarseness.  Dr. Altamese Cabal  . GERD (gastroesophageal reflux disease)   . OSA (obstructive sleep apnea)     PSG 11/03/05 RDI 6  . Obesity   . Mild intermittent asthma   . Rhinitis   . Glaucoma    Current Outpatient Prescriptions on File Prior to Visit  Medication Sig Dispense Refill  . acetaminophen (TYLENOL) 500 MG tablet Take 500 mg by mouth every 8 (eight) hours as needed for mild pain or moderate pain.    Marland Kitchen albuterol (PROVENTIL HFA;VENTOLIN HFA) 108 (90 BASE) MCG/ACT inhaler Inhale 1-2 puffs into the lungs every 6 (six) hours as needed for wheezing or shortness of breath. 1 Inhaler 5  . Fluticasone-Salmeterol (ADVAIR DISKUS) 250-50 MCG/DOSE AEPB Inhale 1 puff into the lungs 2 (two) times daily. 60 each 5   . triamcinolone (NASACORT AQ) 55 MCG/ACT AERO nasal inhaler Place 2 sprays into the nose daily. (Patient not taking: Reported on 11/15/2014) 1 Inhaler 12   No current facility-administered medications on file prior to visit.   History reviewed. No pertinent family history. History   Social History  . Marital Status: Divorced    Spouse Name: N/A  . Number of Children: N/A  . Years of Education: N/A   Occupational History  . Not on file.   Social History Main Topics  . Smoking status: Former Smoker -- 1.50 packs/day for 10 years    Types: Cigarettes    Quit date: 07/02/1997  . Smokeless tobacco: Never Used  . Alcohol Use: No  . Drug Use: No  . Sexual Activity: Not on file   Other Topics Concern  . Not on file   Social History Narrative    Review of Systems: See HPI   Objective:   Filed Vitals:   12/09/14 1425  BP: 137/97  Pulse: 96  Temp: 98 F (36.7 C)  Resp: 16    Physical Exam  Constitutional: She is oriented to person, place, and time.  Cardiovascular: Normal rate, regular rhythm and normal heart sounds.   Pulmonary/Chest: Effort normal. She has wheezes (scattered).  Musculoskeletal: She exhibits tenderness (left side and flank area).  Neurological:  She is alert and oriented to person, place, and time.  Skin: Skin is warm and dry. She is not diaphoretic.     Lab Results  Component Value Date   WBC 7.2 07/29/2014   HGB 11.6* 07/29/2014   HCT 36.0 07/29/2014   MCV 78.1 07/29/2014   PLT 235 07/29/2014   Lab Results  Component Value Date   CREATININE 0.97 07/29/2014   BUN 10 07/29/2014   NA 137 07/29/2014   K 3.7 07/29/2014   CL 103 07/29/2014   CO2 26 07/29/2014    Lab Results  Component Value Date   HGBA1C 5.8 03/01/2014   Lipid Panel     Component Value Date/Time   CHOL 197 04/30/2013 1109   TRIG 84 04/30/2013 1109   HDL 42 04/30/2013 1109   CHOLHDL 4.7 04/30/2013 1109   VLDL 17 04/30/2013 1109   LDLCALC 138* 04/30/2013 1109        Assessment and plan:   Anna [name] was seen today for cough.  Diagnoses and all orders for this visit:  Left flank pain Orders: -     POCT urinalysis dipstick---negative, contaminated sample I have explained to patient that side/flank pain is likely from muscle strain from excessive cough. I have advised her to use heat and ibuprofen to help with muscle strain.  No signs of pyelonephritis or UTI  Cough Orders: -     guaiFENesin-codeine (CHERATUSSIN AC) 100-10 MG/5ML syrup; Take 5 mLs by mouth 4 (four) times daily as needed for cough. Will help with cough and some side pain   Sarcoidosis Continue to follow up with Pulmonolgy to see if they want to switch you to a higher strength of Advair.  Healthcare maintenance Orders: -     Ambulatory referral to Gynecology Patient needs pap and breast exam      Return in about 2 weeks (around 12/23/2014) for Nurse Visit--BP check .   Holland Commons, NP-C [facility] [phone] 12/09/2014, 2:31 PM

## 2014-12-14 ENCOUNTER — Ambulatory Visit (INDEPENDENT_AMBULATORY_CARE_PROVIDER_SITE_OTHER)
Admission: RE | Admit: 2014-12-14 | Discharge: 2014-12-14 | Disposition: A | Discharge status: Home / Self Care | Payer: Medicaid Other | Source: Ambulatory Visit | Attending: Adult Health | Admitting: Adult Health

## 2014-12-14 ENCOUNTER — Ambulatory Visit (INDEPENDENT_AMBULATORY_CARE_PROVIDER_SITE_OTHER): Payer: Medicaid Other | Admitting: Adult Health

## 2014-12-14 ENCOUNTER — Telehealth: Payer: Self-pay | Admitting: Adult Health

## 2014-12-14 ENCOUNTER — Encounter: Payer: Self-pay | Admitting: Adult Health

## 2014-12-14 VITALS — BP 130/80 | HR 104 | Ht 69.0 in | Wt 291.8 lb

## 2014-12-14 DIAGNOSIS — D860 Sarcoidosis of lung: Secondary | ICD-10-CM

## 2014-12-14 DIAGNOSIS — R042 Hemoptysis: Secondary | ICD-10-CM | POA: Diagnosis not present

## 2014-12-14 MED ORDER — PREDNISONE 10 MG PO TABS: ORAL_TABLET | ORAL | Status: DC

## 2014-12-14 NOTE — Telephone Encounter (Signed)
Spoke with [facility] - Rx sent for Prednisone was sent with the wrong qty. This needs to be corrected. Prednisone was sent with #20 instead of #29 Prednisone 10mg  - Sig: 4 tabs for 2 days, then 2 tabs daily for 1 week then 1 tab daily for 1 week and stop. (#29) This has been corrected and resent.  Nothing further needed.

## 2014-12-14 NOTE — Patient Instructions (Addendum)
Prednisone taper 40mg  mg for 2 days then 20mg  daily for 1 week then 10mg  daily for 1 week and stop .  Mucinex DM Twice daily  As needed  Cough/congestion .  Warm heat to ribs.  Follow up 2 weeks with Dr. Craige Cotta  And As needed

## 2014-12-14 NOTE — Progress Notes (Signed)
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   12/14/2014 Acute OV   . Pt c/o worsening hemoptysis x 5 days. Pt states worse in the morning. States going to PCP last week for cough and was given cough medicine. Pt taking medicine without relief. Pt also c/o L rib pain due to cough . He says mucus is blood-tinged without any frank hemoptysis. CXR showed known sign on hilar adenopathy Shows persistent middle lobe atelectasis versus infiltrate. She denies any fever, discolored mucus, orthopnea, PND or leg swelling.  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  HEENT:   No headaches,  Difficulty swallowing,  Tooth/dental problems, or  Sore throat,                No sneezing, itching, ear ache, nasal congestion, post nasal drip,   CV:  No chest pain,  Orthopnea, PND, swelling in lower extremities, anasarca, dizziness, palpitations, syncope.   GI  No heartburn, indigestion, abdominal pain, nausea, vomiting, diarrhea, change in bowel  habits, loss of appetite, bloody stools.   Resp:    No chest wall deformity  Skin: no rash or lesions.  GU: no dysuria, change in color of urine, no urgency or frequency.  No flank pain, no hematuria   MS:  No joint pain or swelling.  No decreased range of motion.  No back pain.  Psych:  No change in mood or affect. No depression or anxiety.  No memory loss.    EXaM  Physical Exam  GEN: A/Ox3; pleasant , NAD, well nourished   HEENT:  [place]/AT,  EACs-clear, TMs-wnl, NOSE-clear, THROAT-clear, no lesions, no postnasal drip or exudate noted.   NECK:  Supple w/ fair ROM; no JVD; normal carotid impulses w/o bruits; no thyromegaly or nodules palpated; no lymphadenopathy.  RESP  Clear  P & A; w/o, wheezes/ rales/ or rhonchi.no accessory muscle use, no dullness to percussion  CARD:  RRR, no m/r/g  , no peripheral edema, pulses intact, no cyanosis or clubbing.  GI:   Soft & nt; nml bowel sounds; no organomegaly or masses detected.  Musco: Warm bil, no deformities or joint swelling noted.   Neuro: alert, no focal deficits noted.    Skin: Warm, no lesions or rashes   cXr 12/14/14  Reviewed independently Persistent mediastinal and hilar adenopathy. 2. Persistent right middle lobe atelectasis and/or infiltrate.

## 2014-12-15 ENCOUNTER — Ambulatory Visit: Payer: Medicaid Other | Admitting: Adult Health

## 2014-12-15 NOTE — Assessment & Plan Note (Signed)
Sarcoid flare Patient has had similar symptoms previously with sarcoid exacerbation Chest x-ray shows stable mediastinal hilar adenopathy Begin a sterile trial. Patient is positive symptoms do not improve or worsen she is to call back and medially for sooner follow up.  Plan prednisone taper 40mg  mg for 2 days then 20mg  daily for 1 week then 10mg  daily for 1 week and stop .  Mucinex DM Twice daily  As needed  Cough/congestion .  Warm heat to ribs.  Follow up 2 weeks with Dr. Craige Cotta  And As needed

## 2014-12-15 NOTE — Progress Notes (Signed)
Reviewed and agree with assessment/plan. 

## 2014-12-17 NOTE — Progress Notes (Signed)
Quick Note:  LVM for pt to return call ______ 

## 2014-12-17 NOTE — Progress Notes (Signed)
Quick Note:  Called spoke with patient, she was already aware of cxr results as they were discussed at the visit. ______

## 2015-01-04 ENCOUNTER — Ambulatory Visit (INDEPENDENT_AMBULATORY_CARE_PROVIDER_SITE_OTHER)
Admission: RE | Admit: 2015-01-04 | Discharge: 2015-01-04 | Disposition: A | Discharge status: Home / Self Care | Payer: Medicaid Other | Source: Ambulatory Visit | Attending: Adult Health | Admitting: Adult Health

## 2015-01-04 ENCOUNTER — Ambulatory Visit (INDEPENDENT_AMBULATORY_CARE_PROVIDER_SITE_OTHER): Payer: Medicaid Other | Admitting: Adult Health

## 2015-01-04 ENCOUNTER — Encounter: Payer: Self-pay | Admitting: Adult Health

## 2015-01-04 ENCOUNTER — Telehealth: Payer: Self-pay | Admitting: Internal Medicine

## 2015-01-04 VITALS — BP 132/86 | HR 68 | Temp 98.7°F | Ht 66.0 in | Wt 290.0 lb

## 2015-01-04 DIAGNOSIS — R042 Hemoptysis: Secondary | ICD-10-CM

## 2015-01-04 DIAGNOSIS — D860 Sarcoidosis of lung: Secondary | ICD-10-CM | POA: Diagnosis not present

## 2015-01-04 DIAGNOSIS — J4521 Mild intermittent asthma with (acute) exacerbation: Secondary | ICD-10-CM | POA: Diagnosis not present

## 2015-01-04 MED ORDER — BUDESONIDE-FORMOTEROL FUMARATE 80-4.5 MCG/ACT IN AERO: 2.0000 | INHALATION_SPRAY | Freq: Two times a day (BID) | RESPIRATORY_TRACT | Status: DC

## 2015-01-04 NOTE — Progress Notes (Signed)
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   01/04/2015 Follow up : Sarcoid  Pt returns for 2 week follow up  Seen last ov with suspected sarcoid flare with increased cough and development of hemoptysis  CXR last ov showed persistent mediastinal/hilar adenopathy. , along with RML atx.  She was started on prednisone taper over 2 weeks.  CXR today show chronic changes with no acute process.  She denies any fever, discolored mucus, orthopnea, PND or leg swelling. She is feeling better with resolution of hemoptysis and chest wall pain.  Still has lingering cough but much better.  Previously on advair but stopped. Complains that has on/off wheezing , worse with heat.      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  HEENT:   No headaches,  Difficulty swallowing,  Tooth/dental problems, or  Sore throat,                No sneezing, itching, ear ache, nasal congestion, post nasal drip,   CV:  No chest pain,  Orthopnea, PND,  swelling in lower extremities, anasarca, dizziness, palpitations, syncope.   GI  No heartburn, indigestion, abdominal pain, nausea, vomiting, diarrhea, change in bowel habits, loss of appetite, bloody stools.   Resp:    No chest wall deformity  Skin: no rash or lesions.  GU: no dysuria, change in color of urine, no urgency or frequency.  No flank pain, no hematuria   MS:  No joint pain or swelling.  No decreased range of motion.  No back pain.  Psych:  No change in mood or affect. No depression or anxiety.  No memory loss.    EXaM  Physical Exam  GEN: A/Ox3; pleasant , NAD obese   HEENT:  [place]/AT,  EACs-clear, TMs-wnl, NOSE-clear, THROAT-clear, no lesions, no postnasal drip or exudate noted.   NECK:  Supple w/ fair ROM; no JVD; normal carotid impulses w/o bruits; no thyromegaly or nodules palpated; no lymphadenopathy.  RESP  Faint exp wheezing, .no accessory muscle use, no dullness to percussion  CARD:  RRR, no m/r/g  , no peripheral edema, pulses intact, no cyanosis or clubbing.  GI:   Soft & nt; nml bowel sounds; no organomegaly or masses detected.  Musco: Warm bil, no deformities or joint swelling noted.   Neuro: alert, no focal deficits noted.    Skin: Warm, no lesions or rashes   cXr 12/14/14  Reviewed independently Persistent mediastinal and hilar adenopathy. 2. Persistent right middle lobe atelectasis and/or infiltrate.  01/04/2015 cxr  Stable hilar and  peritracheal adenopathy compatible with sarcoid. 2. No acute cardiopulmonary disease

## 2015-01-04 NOTE — Telephone Encounter (Signed)
Patient has called in today to ask nurse to give her a call so she can report her last few BP logs; please f/u with patient for readings

## 2015-01-04 NOTE — Assessment & Plan Note (Signed)
Recent flare now resolving with steroid challenge.  Has some lingering cough and wheezing -will try symbicort  Hold off on additonal steroids for now if possible (lots of side effects-wt gain, mood, etc )   Plan  Begin Symbicort 80 2 puffs Twice daily  , rinse after use.  follow up Dr. Craige CottaSood  In 4 weeks and As needed   Please contact office for sooner follow up if symptoms do not improve or worsen or seek emergency care

## 2015-01-04 NOTE — Assessment & Plan Note (Signed)
No perceived benefit with advair Trial of Symbicort   Plan  Begin Symbicort 80 2 puffs Twice daily  , rinse after use.  follow up Dr. Craige CottaSood  In 4 weeks and As needed   Please contact office for sooner follow up if symptoms do not improve or worsen or seek emergency care

## 2015-01-04 NOTE — Patient Instructions (Signed)
Begin Symbicort 80 2 puffs Twice daily  , rinse after use.  follow up Dr. Craige CottaSood  In 4 weeks and As needed   Please contact office for sooner follow up if symptoms do not improve or worsen or seek emergency care

## 2015-02-01 ENCOUNTER — Encounter: Payer: Self-pay | Admitting: Adult Health

## 2015-02-01 ENCOUNTER — Ambulatory Visit (INDEPENDENT_AMBULATORY_CARE_PROVIDER_SITE_OTHER): Payer: Medicaid Other | Admitting: Adult Health

## 2015-02-01 VITALS — BP 122/78 | HR 84 | Temp 98.1°F | Ht 66.0 in | Wt 288.0 lb

## 2015-02-01 DIAGNOSIS — D860 Sarcoidosis of lung: Secondary | ICD-10-CM

## 2015-02-01 DIAGNOSIS — J4520 Mild intermittent asthma, uncomplicated: Secondary | ICD-10-CM | POA: Diagnosis not present

## 2015-02-01 NOTE — Progress Notes (Signed)
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   01/04/15  Follow up : Sarcoid  Pt returns for 2 week follow up  Seen last ov with suspected sarcoid flare with increased cough and development of hemoptysis  CXR last ov showed persistent mediastinal/hilar adenopathy. , along with RML atx.  She was started on prednisone taper over 2 weeks.  CXR today show chronic changes with no acute process.  She denies any fever, discolored mucus, orthopnea, PND or leg swelling. She is feeling better with resolution of hemoptysis and chest wall pain.  Still has lingering cough but much better.  Previously on advair but stopped. Complains that has on/off wheezing , worse with heat.  >>Symbicort rx   02/01/2015 Follow up : Sarcoid  Pt returns for 1 month follow up .  Seen last ov with flare of sarcoid . tx w/ prednisone with improvement but lingering cough .  She was started on Symbicort.  Feels she is much better.  Cough is decreased. Denies any hemoptysis.  No chest pain, orthopnea or edema.    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  HEENT:   No headaches,  Difficulty swallowing,  Tooth/dental problems, or  Sore throat,                No sneezing, itching, ear ache, nasal congestion, post nasal drip,   CV:  No chest pain,  Orthopnea, PND, swelling in lower extremities, anasarca, dizziness, palpitations, syncope.   GI  No heartburn, indigestion, abdominal pain, nausea, vomiting, diarrhea, change in bowel habits, loss of appetite, bloody stools.   Resp:    No chest wall deformity  Skin: no rash or lesions.  GU: no dysuria, change in color of urine, no urgency or frequency.  No flank pain, no hematuria   MS:  No joint pain or swelling.  No decreased range of motion.  No back pain.  Psych:  No change in mood or affect. No depression or anxiety.  No memory loss.    EXaM  Physical Exam  GEN: A/Ox3; pleasant , NAD obese   HEENT:  [place]/AT,  EACs-clear, TMs-wnl, NOSE-clear, THROAT-clear, no lesions, no postnasal drip or exudate noted.   NECK:  Supple w/ fair ROM; no JVD; normal carotid impulses w/o bruits; no thyromegaly or nodules palpated; no lymphadenopathy.  RESP  Faint exp wheezing, .no accessory muscle use, no dullness to percussion  CARD:  RRR, no m/r/g  , no peripheral edema, pulses intact, no cyanosis or clubbing.  GI:   Soft & nt;  nml bowel sounds; no organomegaly or masses detected.  Musco: Warm bil, no deformities or joint swelling noted.   Neuro: alert, no focal deficits noted.    Skin: Warm, no lesions or rashes   cXr 12/14/14  Reviewed independently Persistent mediastinal and hilar adenopathy. 2. Persistent right middle lobe atelectasis and/or infiltrate.   01/04/15 cxr  Stable hilar and peritracheal adenopathy compatible with sarcoid. 2. No acute cardiopulmonary disease

## 2015-02-01 NOTE — Patient Instructions (Signed)
Continue on Symbicort 80 2 puffs Twice daily  , rinse after use.  follow up Dr. Craige Cotta  In 3 months  and As needed   Please contact office for sooner follow up if symptoms do not improve or worsen or seek emergency care

## 2015-02-02 NOTE — Assessment & Plan Note (Signed)
Recent flare now resolved  Cont on current regimen   

## 2015-02-02 NOTE — Progress Notes (Signed)
Reviewed and agree with assessment/plan. 

## 2015-02-02 NOTE — Assessment & Plan Note (Signed)
Recent flare imrpoved on symbicort  Cont on current regimen

## 2015-05-05 ENCOUNTER — Encounter: Payer: Self-pay | Admitting: Adult Health

## 2015-05-05 ENCOUNTER — Ambulatory Visit (INDEPENDENT_AMBULATORY_CARE_PROVIDER_SITE_OTHER): Payer: Medicaid Other | Admitting: Adult Health

## 2015-05-05 VITALS — BP 130/84 | HR 76 | Temp 97.4°F | Ht 66.0 in | Wt 278.0 lb

## 2015-05-05 DIAGNOSIS — J4520 Mild intermittent asthma, uncomplicated: Secondary | ICD-10-CM

## 2015-05-05 DIAGNOSIS — D860 Sarcoidosis of lung: Secondary | ICD-10-CM | POA: Diagnosis not present

## 2015-05-05 MED ORDER — BUDESONIDE-FORMOTEROL FUMARATE 80-4.5 MCG/ACT IN AERO: 2.0000 | INHALATION_SPRAY | Freq: Every day | RESPIRATORY_TRACT | Status: DC

## 2015-05-05 NOTE — Patient Instructions (Addendum)
May decrease Symbicort 80mcg 2 puffs daily  follow up Dr. Craige CottaSood  In 3-4  months  and As needed   Please contact office for sooner follow up if symptoms do not improve or worsen or seek emergency care

## 2015-05-08 NOTE — Progress Notes (Signed)
Subjective:    Patient ID: Anna [name], female    DOB: 12-09-1965, 49 y.o.   MRN: [id number]  HPI 49 yo female with sarcoid with pulmonary and ocular involvement , mild intermittent asthma and mild OSA   05/05/15 Follow up : Sarcoid and Asthma Pt returns for a 3 month follow up .  Says overall she is doing good. Has some occasional  Wheezing. Feels Symbicort is working well.  No flare of cough, dyspnea or rash since last visit.       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 Pulmonary sarcoidosis (HCC)     and ocular.  biopsy 10/00, PFT 10/29/08 FEV1 2.24(77%), FVC 2.78(74%), TLC 3.72(68%), DLCO 61%, no BD response  . Vocal cord polyp     with hoarseness.  Dr. Altamese Cabal  . GERD (gastroesophageal reflux disease)   . OSA (obstructive sleep apnea)     PSG 11/03/05 RDI 6  . Obesity   . Mild intermittent asthma   . Rhinitis   . Glaucoma    Current Outpatient Prescriptions on File Prior to Visit  Medication Sig Dispense Refill  . acetaminophen (TYLENOL) 500 MG tablet Take 500 mg by mouth every 8 (eight) hours as  needed for mild pain or moderate pain.    Marland Kitchen albuterol (PROVENTIL HFA;VENTOLIN HFA) 108 (90 BASE) MCG/ACT inhaler Inhale 1-2 puffs into the lungs every 6 (six) hours as needed for wheezing or shortness of breath. (Patient not taking: Reported on 05/05/2015) 1 Inhaler 5   No current facility-administered medications on file prior to visit.      Review of Systems Constitutional:   No  weight loss, night sweats,  Fevers, chills, fatigue, or  lassitude.  HEENT:   No headaches,  Difficulty swallowing,  Tooth/dental problems, or  Sore throat,                No sneezing, itching, ear ache, nasal congestion, post nasal drip,   CV:  No chest pain,  Orthopnea, PND, swelling in lower extremities, anasarca, dizziness, palpitations, syncope.   GI  No heartburn, indigestion, abdominal pain, nausea, vomiting, diarrhea, change in bowel habits, loss of appetite, bloody stools.   Resp: No shortness of breath with exertion or at rest.  No excess mucus, no productive cough,  No non-productive cough,  No coughing up of blood.  No change in color of mucus.  No wheezing.  No chest wall deformity  Skin: no rash or lesions.  GU: no dysuria, change in color of urine, no urgency or frequency.  No flank pain, no hematuria   MS:  No joint pain or  swelling.  No decreased range of motion.  No back pain.  Psych:  No change in mood or affect. No depression or anxiety.  No memory loss.         Objective:   Physical Exam GEN: A/Ox3; pleasant , NAD, well nourished   HEENT:  [place]/AT,  EACs-clear, TMs-wnl, NOSE-clear, THROAT-clear, no lesions, no postnasal drip or exudate noted.   NECK:  Supple w/ fair ROM; no JVD; normal carotid impulses w/o bruits; no thyromegaly or nodules palpated; no lymphadenopathy.  RESP  Clear  P & A; w/o, wheezes/ rales/ or rhonchi.no accessory muscle use, no dullness to percussion  CARD:  RRR, no m/r/g  , no peripheral edema, pulses intact, no cyanosis or clubbing.  GI:   Soft & nt; nml  bowel sounds; no organomegaly or masses detected.  Musco: Warm bil, no deformities or joint swelling noted.   Neuro: alert, no focal deficits noted.    Skin: Warm, no lesions or rashes         Assessment & Plan:

## 2015-05-09 NOTE — Assessment & Plan Note (Signed)
Stable   Plan  May decrease Symbicort 80mcg 2 puffs daily  follow up Dr. Craige CottaSood  In 3-4  months  and As needed   Please contact office for sooner follow up if symptoms do not improve or worsen or seek emergency care

## 2015-05-09 NOTE — Assessment & Plan Note (Signed)
Stable without flare   Plan  Cont on current regimen  

## 2015-05-16 ENCOUNTER — Telehealth: Payer: Self-pay | Admitting: Adult Health

## 2015-05-16 MED ORDER — PREDNISONE 10 MG PO TABS: ORAL_TABLET | ORAL | Status: DC

## 2015-05-16 NOTE — Telephone Encounter (Signed)
Called and spoke to pt. Informed her of the recs per VS. Rx sent to preferred pharmacy. Appt made with VS on 06/09/2015. Pt verbalized understanding and denied any further questions or concerns at this time.

## 2015-05-16 NOTE — Telephone Encounter (Signed)
Please send script for prednisone 10 mg pill >> 3 pills daily for 3 days, 2 pills daily for 2 days, then 1 pill daily.  Send for 30 pills with 1 refill.  Schedule ROV with me or Tammy Parrett with Chest xray for 3 weeks >> okay to double book with me if needed.

## 2015-05-16 NOTE — Telephone Encounter (Signed)
Patient says that every since she left the office (beginning of October), her breathing has gotten worse.  She can hardly walk without stopping to catch her breath.  (She saw Silver Hugueninammy Parett, NP at the beginning of the month.)  Wheezing is the worse in the morning when she wakes up.  Non-productive cough.  Pain in right side at all times, hurts worse with cough.   [facility]   No Known Allergies

## 2015-06-09 ENCOUNTER — Ambulatory Visit (INDEPENDENT_AMBULATORY_CARE_PROVIDER_SITE_OTHER): Payer: Medicaid Other | Admitting: Pulmonary Disease

## 2015-06-09 ENCOUNTER — Encounter: Payer: Self-pay | Admitting: Pulmonary Disease

## 2015-06-09 ENCOUNTER — Ambulatory Visit (INDEPENDENT_AMBULATORY_CARE_PROVIDER_SITE_OTHER)
Admission: RE | Admit: 2015-06-09 | Discharge: 2015-06-09 | Disposition: A | Discharge status: Home / Self Care | Payer: Medicaid Other | Source: Ambulatory Visit | Attending: Pulmonary Disease | Admitting: Pulmonary Disease

## 2015-06-09 VITALS — BP 130/80 | HR 77 | Ht 66.0 in | Wt 284.0 lb

## 2015-06-09 DIAGNOSIS — D860 Sarcoidosis of lung: Secondary | ICD-10-CM

## 2015-06-09 MED ORDER — ALBUTEROL SULFATE 108 (90 BASE) MCG/ACT IN AEPB: 1.0000 | INHALATION_SPRAY | Freq: Four times a day (QID) | RESPIRATORY_TRACT | Status: DC | PRN

## 2015-06-09 NOTE — Progress Notes (Signed)
Chief Complaint  Patient presents with  . Follow-up    Pt following for sarcoidosis with CXR today: pt states she is ok. pt c/o a pain on her right side that started Nov 8th and is still there. pt states walking up hill she gets SOB and c/o some dry coughing at times.     Current Outpatient Prescriptions on File Prior to Visit  Medication Sig  . acetaminophen (TYLENOL) 500 MG tablet Take 500 mg by mouth every 8 (eight) hours as needed for mild pain or moderate pain.  . budesonide-formoterol (SYMBICORT) 80-4.5 MCG/ACT inhaler Inhale 2 puffs into the lungs daily.  . predniSONE (DELTASONE) 10 MG tablet 3 pills daily for 3 days, 2 pills daily for 2 days, then 1 pill daily  . albuterol (PROVENTIL HFA;VENTOLIN HFA) 108 (90 BASE) MCG/ACT inhaler Inhale 1-2 puffs into the lungs every 6 (six) hours as needed for wheezing or shortness of breath. (Patient not taking: Reported on 06/09/2015)   No current facility-administered medications on file prior to visit.    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  BP 130/80 mmHg  Pulse 77  Ht 5\' 6"  (1.676 m)  Wt 284 lb (128.822 kg)  BMI 45.86 kg/m2  SpO2 100%  History of Present Illness: Anna [name] is a 49 y.o. female with sarcoid with pulmonary and ocular invovlement, mild/intermittent asthma, and mild OSA.  Her chest pain and breathing are improved since starting prednisone.  She is not having cough, wheeze, sputum, fever, or hemoptysis.  She is concerned about weight gain with prednisone.   Physical Exam:  General - No distress ENT - No sinus tenderness, no oral exudate, no LAN Cardiac - s1s2 regular, no murmur Chest - expiratory wheeze Rt > Lt  Back - No focal tenderness Abd - Soft, non-tender Ext - No edema Neuro - Normal strength Skin - No rashes Psych - normal mood, and behavior  Dg Chest 2 View  06/09/2015  CLINICAL DATA:  F/u on sarcoid, cough, wheezing, x-smoker, EXAM: CHEST  2 VIEW COMPARISON:  01/04/2015 FINDINGS: Heart size is upper normal. Prominent bilateral hilar adenopathy appears stable. Right paratracheal adenopathy is also noted and stable right middle lobe atelectasis appear stable. There are no focal consolidations. IMPRESSION: Stable appearance of bilateral hilar and right paratracheal adenopathy, right middle lobe atelectasis. Electronically Signed   By: Norva PavlovElizabeth  Brown M.D.   On: 06/09/2015 16:22     Assessment/Plan:  Pulmonary sarcoidosis. Plan: - change to prednisone 5 mg daily for two weeks, and then d/c prednisone if okay  Upper airway cough syndrome with  post-nasal drip. Plan: - nasal irrigation, nasacort  Persistent asthma. I am concerned her current symptoms might be related to asthma. Plan: - continue symbicort and prn albuterol   Patient Instructions  Prednisone 5 mg daily for 2 weeks, and if okay then stop prednisone after 2 weeks  Follow up in 2 months    [name], MD [place] [facility]/Sleep Pager:  [phone] 06/09/2015, 3:58 PM

## 2015-06-09 NOTE — Patient Instructions (Signed)
Prednisone 5 mg daily for 2 weeks, and if okay then stop prednisone after 2 weeks  Follow up in 2 months

## 2015-06-09 NOTE — Addendum Note (Signed)
Addended by: Angelene GiovanniOLDAN, Damonte Frieson S on: 06/09/2015 04:54 PM   Modules accepted: Orders

## 2015-08-12 MED FILL — SYMBICORT 80-4.5 MCG INH: 80-4.5 | 30 days supply | Qty: 10 | Fill #2

## 2015-09-02 ENCOUNTER — Encounter: Payer: Self-pay | Admitting: Pulmonary Disease

## 2015-09-02 ENCOUNTER — Ambulatory Visit (INDEPENDENT_AMBULATORY_CARE_PROVIDER_SITE_OTHER): Payer: Medicaid Other | Admitting: Pulmonary Disease

## 2015-09-02 VITALS — BP 138/76 | HR 79 | Temp 98.1°F | Ht 66.0 in | Wt 280.0 lb

## 2015-09-02 DIAGNOSIS — J4530 Mild persistent asthma, uncomplicated: Secondary | ICD-10-CM | POA: Diagnosis not present

## 2015-09-02 DIAGNOSIS — D860 Sarcoidosis of lung: Secondary | ICD-10-CM

## 2015-09-02 NOTE — Progress Notes (Signed)
Chief Complaint  Patient presents with  . Follow-up    Pt has no breathing complaints today-states she is at baseline.     Current Outpatient Prescriptions on File Prior to Visit  Medication Sig  . albuterol (PROVENTIL HFA;VENTOLIN HFA) 108 (90 BASE) MCG/ACT inhaler Inhale 1-2 puffs into the lungs every 6 (six) hours as needed for wheezing or shortness of breath.  . budesonide-formoterol (SYMBICORT) 80-4.5 MCG/ACT inhaler Inhale 2 puffs into the lungs daily.   No current facility-administered medications on file prior to visit.    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  BP 138/76 mmHg  Pulse 79  Temp(Src) 98.1 F (36.7 C) (Oral)  Ht 5\' 6"  (1.676 m)  Wt 280 lb (127.007 kg)  BMI 45.21 kg/m2  SpO2 98%  History of Present Illness: Anna [name] is a 50 y.o. female with sarcoid with pulmonary and ocular invovlement, mild/intermittent asthma, and mild OSA.  She has been okay off prednisone.  She still gets intermittent episodes of cough, and sometimes has blood streaking  in sputum.  She feels symbicort has helped her cough.  She is not needing to use albuterol.  She was seen by eye doctor recently and told things looked better.  She is not having fever, sweats, chest pain, or wheeze.  She is getting around better, and trying to work on getting her weight down.  Physical Exam:  General - No distress ENT - No sinus tenderness, no oral exudate, no LAN Cardiac - s1s2 regular, no murmur Chest - expiratory wheeze Rt > Lt  Back - No focal tenderness Abd - Soft, non-tender Ext - No edema Neuro - Normal strength Skin - No rashes Psych - normal mood, and behavior  Assessment/Plan:  Pulmonary sarcoidosis. Plan: - monitor off prednisone  Upper airway cough syndrome with post-nasal drip. Plan: - nasal irrigation, nasacort as needed  Persistent asthma. Plan: - continue symbicort and prn albuterol  Mild/moderate mitral regurgitation. Plan: - advised her to contact cardiology to establish with new cardiologist since Dr. Daleen SquibbWall has left clinical practice    Patient Instructions  Follow up in 4 months    Coralyn HellingVineet Kamaile Zachow, MD [place] [facility]Critical [facility]/Sleep Pager:  [phone] 09/02/2015, 4:05 PM

## 2015-09-02 NOTE — Patient Instructions (Signed)
Follow up in 4 months 

## 2015-09-13 MED FILL — SYMBICORT 80-4.5 MCG INH: 80-4.5 | 30 days supply | Qty: 10 | Fill #3

## 2015-09-28 ENCOUNTER — Ambulatory Visit: Payer: Medicaid Other | Admitting: Internal Medicine

## 2015-10-03 ENCOUNTER — Ambulatory Visit: Payer: Medicaid Other | Admitting: Internal Medicine

## 2015-10-07 ENCOUNTER — Ambulatory Visit: Payer: Medicaid Other | Admitting: Internal Medicine

## 2015-10-07 MED FILL — SYMBICORT 80-4.5 MCG INH: 80-4.5 | 30 days supply | Qty: 10 | Fill #4

## 2015-10-11 ENCOUNTER — Ambulatory Visit: Payer: Medicaid Other | Admitting: Internal Medicine

## 2015-10-24 ENCOUNTER — Telehealth: Payer: Self-pay | Admitting: Pulmonary Disease

## 2015-10-24 MED ORDER — PREDNISONE 10 MG PO TABS: 30.0000 mg | ORAL_TABLET | Freq: Every day | ORAL | Status: DC

## 2015-10-24 MED FILL — predniSONE 10 MG TABS: 10 | 4 days supply | Qty: 12 | Fill #0

## 2015-10-24 NOTE — Telephone Encounter (Signed)
Please send script for prednisone 30 mg daily.  She needs to come in for ROV this week to have further assessment.

## 2015-10-24 NOTE — Telephone Encounter (Signed)
lmtcb X1 for pt  

## 2015-10-24 NOTE — Telephone Encounter (Signed)
[phone], pt cb

## 2015-10-24 NOTE — Telephone Encounter (Signed)
Spoke with pt, states she is having a sarcoid flare-up starting over the weekend: pt c/o prod cough with clear/blood tinged mucus worse qam and lessens through the day-coughs until almost vomiting.  Pt requesting prednisone, as well as further recs.  Denies stomach pain, sinus congestion, chest pain, fever.   [facility].    VS please advise on recs.  Thanks.

## 2015-10-24 NOTE — Telephone Encounter (Signed)
Spoke with pt. She had a question about why we only sent in #12 of prednisone tabs. Advised her that Morrie Sheldonshley did that so that she would have enough meds to last until her appointment. Pt verbalized understanding. Nothing further was needed.

## 2015-10-24 NOTE — Telephone Encounter (Signed)
Pt scheduled with VS on Wednesday.  Enough prednisone sent in to last through that visit.  Nothing further needed.

## 2015-10-26 ENCOUNTER — Encounter: Payer: Self-pay | Admitting: Pulmonary Disease

## 2015-10-26 ENCOUNTER — Ambulatory Visit (INDEPENDENT_AMBULATORY_CARE_PROVIDER_SITE_OTHER)
Admission: RE | Admit: 2015-10-26 | Discharge: 2015-10-26 | Disposition: A | Discharge status: Home / Self Care | Payer: Medicaid Other | Source: Ambulatory Visit | Attending: Pulmonary Disease | Admitting: Pulmonary Disease

## 2015-10-26 ENCOUNTER — Ambulatory Visit (INDEPENDENT_AMBULATORY_CARE_PROVIDER_SITE_OTHER): Payer: Medicaid Other | Admitting: Pulmonary Disease

## 2015-10-26 VITALS — BP 122/82 | HR 82 | Ht 66.0 in | Wt 276.0 lb

## 2015-10-26 DIAGNOSIS — R042 Hemoptysis: Secondary | ICD-10-CM

## 2015-10-26 DIAGNOSIS — R040 Epistaxis: Secondary | ICD-10-CM | POA: Diagnosis not present

## 2015-10-26 DIAGNOSIS — D860 Sarcoidosis of lung: Secondary | ICD-10-CM

## 2015-10-26 DIAGNOSIS — J4530 Mild persistent asthma, uncomplicated: Secondary | ICD-10-CM | POA: Diagnosis not present

## 2015-10-26 NOTE — Patient Instructions (Signed)
Chest xray today >> will call with results and discuss plans for prednisone  Try using saline nasal drops before going to bed  Try using a humidifier in your bedroom at night  Follow up in 4 months

## 2015-10-26 NOTE — Progress Notes (Signed)
Chief Complaint  Patient presents with  . Acute Visit    Reports hemoptysis over the weekend - at times mixed with sputum and other times straight blood. Blood is bright red and some clots. Pt denies fever, chest pain. Pt reports having a cough.     Current Outpatient Prescriptions on File Prior to Visit  Medication Sig  . albuterol (PROVENTIL HFA;VENTOLIN HFA) 108 (90 BASE) MCG/ACT inhaler Inhale 1-2 puffs into the lungs every 6 (six) hours as needed for wheezing or shortness of breath.  . budesonide-formoterol (SYMBICORT) 80-4.5 MCG/ACT inhaler Inhale 2 puffs into the lungs daily.  . predniSONE (DELTASONE) 10 MG tablet Take 3 tablets (30 mg total) by mouth daily with breakfast.   No current facility-administered medications on file prior to visit.    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  BP 122/82 mmHg  Pulse 82  Ht 5\' 6"  (1.676 m)  Wt 276 lb (125.193 kg)  BMI 44.57 kg/m2  SpO2 97%  LMP 08/09/2015 (Approximate)  History of Present Illness: Anna [name] is a 50  y.o. female with sarcoid with pulmonary and ocular invovlement, mild/intermittent asthma, and mild OSA.  She called office earlier this week with increase cough.  She was coughing up clear sputum with blood tinging.  Her cough would trigger her to feel like vomiting.  She was not having fever or abdominal pain.  She was started on prednisone 30 mg daily.  She notices her cough is worse in the morning after she just wakes up.  Her sinuses have been getting dry.  Her neighbor smokes, and the smoke can come into her house.  She is not having fever, wheeze, chest pain, or skin rash.  Her hemoptysis was terrible over the weekend.  She is hardly coughing up anything now.  She has noticed more sinus trouble with allergies.  Physical Exam:  General - No distress ENT - No sinus tenderness, pinpoint area of recent bleeding activity on septum on Lt, no oral exudate, no LAN Cardiac - s1s2 regular, no murmur Chest - expiratory wheeze Rt > Lt  Back - No focal tenderness Abd - Soft, non-tender Ext - No edema Neuro - Normal strength Skin - No rashes Psych - normal mood, and behavior  Assessment/Plan:  Hemoptysis >> might be related to epistaxis.  Her symptom constellation is not typical for her usual sarcoidosis flares. - chest xray today - she can try saline nasal drops and humidifier  Pulmonary sarcoidosis. - will get chest xray today, and then decide if she can quickly come off prednisone  Upper airway cough  syndrome with post-nasal drip. - nasal irrigation, nasacort as needed  Persistent asthma. - continue symbicort and prn albuterol  Mild/moderate mitral regurgitation. - advised her to contact cardiology to establish with new cardiologist since Dr. Daleen Squibb has left clinical practice    Patient Instructions  Chest xray today >> will call with results and discuss plans for prednisone  Try using saline nasal drops before going to bed  Try using a humidifier in your bedroom at  night  Follow up in 4 months    [name], MD [facility]Critical [facility]/Sleep Pager:  [phone] 10/26/2015, 3:55 PM

## 2015-10-27 ENCOUNTER — Telehealth: Payer: Self-pay | Admitting: Pulmonary Disease

## 2015-10-27 MED ORDER — PREDNISONE 5 MG PO TABS: ORAL_TABLET | ORAL | Status: DC

## 2015-10-27 MED FILL — predniSONE 5 MG TABS: 5 | 28 days supply | Qty: 70 | Fill #0

## 2015-10-27 NOTE — Telephone Encounter (Signed)
Dg Chest 2 View  10/26/2015  CLINICAL DATA:  Recent episode of hemoptysis; history of sarcoidosis, former smoker, asthma, morbid obesity. EXAM: CHEST  2 VIEW COMPARISON:  Chest x-ray of June 09, 2015 FINDINGS: The lungs are adequately inflated. There are multiple small pulmonary parenchymal nodules which appears stable. There is stable bilateral hilar lymphadenopathy. The cardiac silhouette is top-normal in size. The pulmonary vascularity is not engorged. The trachea is midline. There is stable scarring at the left lung base. The bony thorax is unremarkable. IMPRESSION: Chronic changes consistent with known sarcoidosis. No definite acute change since the previous study. However, given hemoptysis clinically, chest CT scanning now is recommended. Electronically Signed   By: David  [place]azil[place] M.D.   On: 10/26/2015 15:55    Results d/w pt.  No further episodes of hemoptysis >> likely had epistaxis with drainage into upper airway.   Will gradually wean off prednisone.  Advised her to call if hemoptysis recurs >> would then need CT chest with IV contrast.

## 2015-10-28 ENCOUNTER — Telehealth: Payer: Self-pay | Admitting: Pulmonary Disease

## 2015-10-28 NOTE — Telephone Encounter (Signed)
Patient calling to find out the dosing on her Prednisone.  Patient states that it did not tell her how to take the taper on the pill bottle.   Went over the instructions on the prednisone as prescribed. Nothing further needed.

## 2015-11-18 MED FILL — SYMBICORT 80-4.5 MCG INH: 80-4.5 | 30 days supply | Qty: 10 | Fill #5

## 2015-12-20 ENCOUNTER — Telehealth: Payer: Self-pay | Admitting: Pulmonary Disease

## 2015-12-20 NOTE — Telephone Encounter (Signed)
Spoke with pt, states she had cancelled her appointment for 12/22/15 and has rescheduled rov to 02/27/16.  Pt wanted to make VS aware that she is having no respiratory issues at this time, but will call if anything happens.  Nothing further needed. Forwarding to VS as FYI.

## 2015-12-20 NOTE — Telephone Encounter (Signed)
Noted  

## 2015-12-22 ENCOUNTER — Ambulatory Visit: Payer: Medicaid Other | Admitting: Pulmonary Disease

## 2016-02-13 IMAGING — CT CT CHEST W/ CM
2 of 3 series · 15 of 36 positions shown, 18 images · IV contrast (Omnipaque 300)
Comparison: Chest CT 08/12/2013.

CLINICAL DATA: History of sarcoidosis. Worsening cough and
hemoptysis.

EXAM:
CT CHEST WITH CONTRAST
TECHNIQUE: Multidetector CT imaging of the chest was performed during
intravenous contrast administration.
CONTRAST:  80mL OMNIPAQUE IOHEXOL 300 MG/ML  SOLN

[Series 2: chest routine with · axial · 0.71mm/px · z∈[-293,-48]mm · 12 of 59 slices shown, 15 images]
[im 5/59  mediastinal]
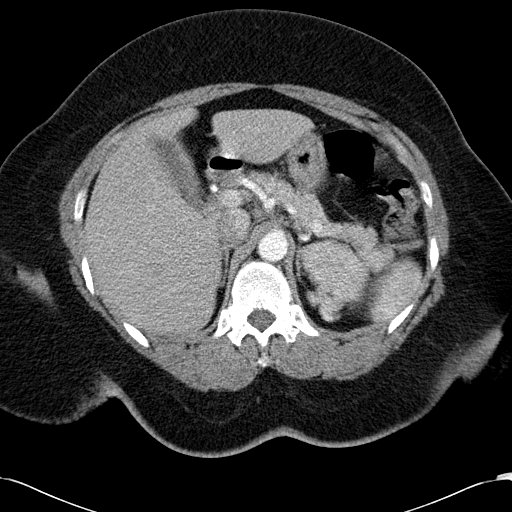
[im 5/59  lung]
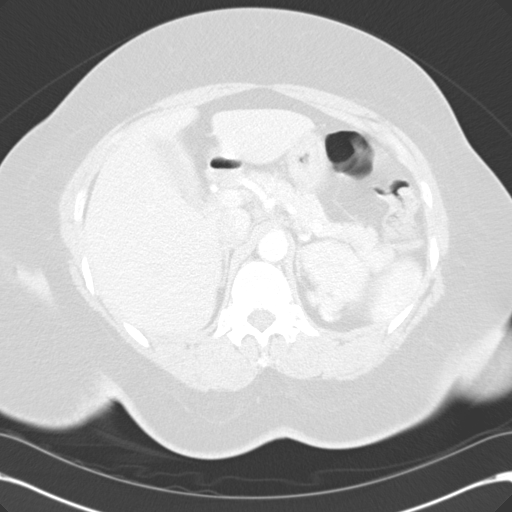
[im 9/59  lung]
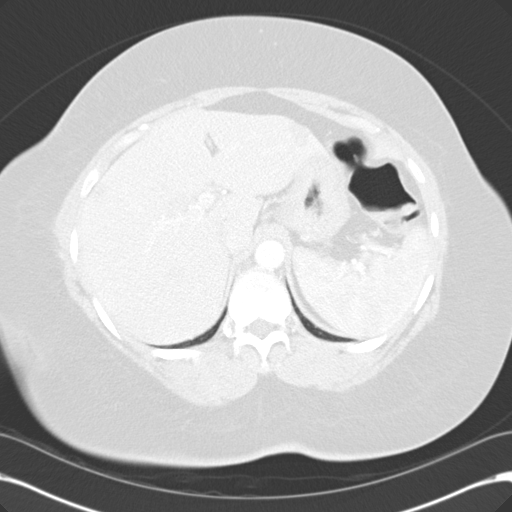
[im 13/59  lung]
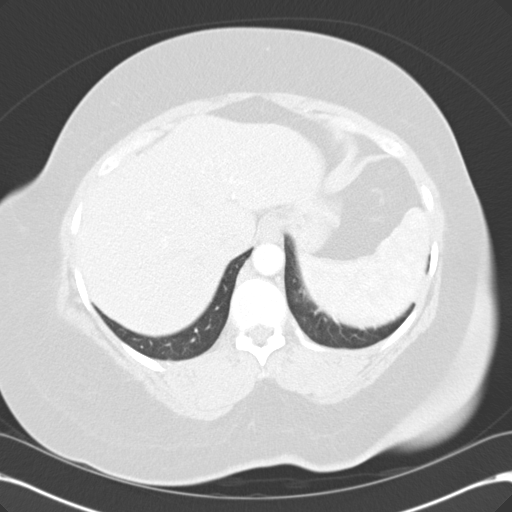
[im 18/59  lung]
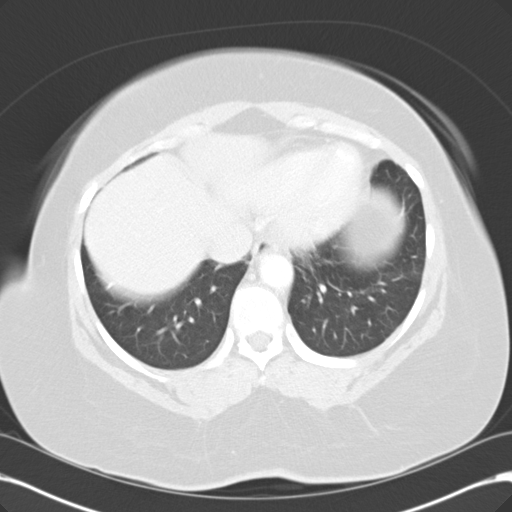
[im 22/59  mediastinal]
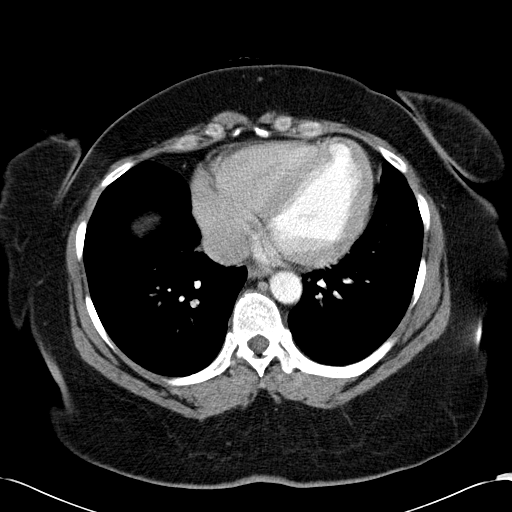
[im 22/59  lung]
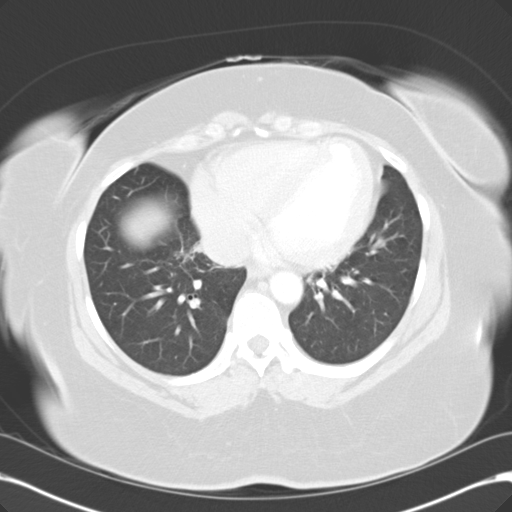
[im 26/59  lung]
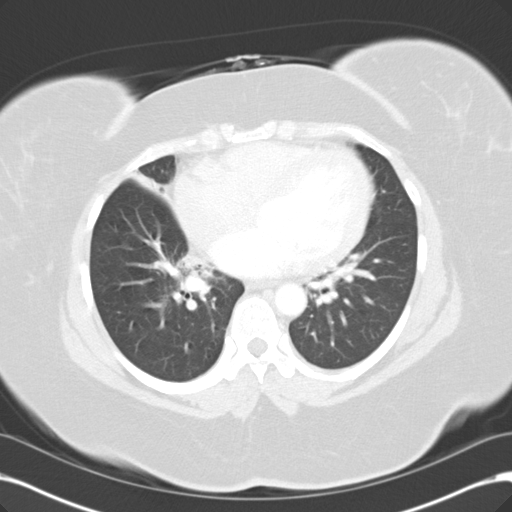
[im 33/59  lung]
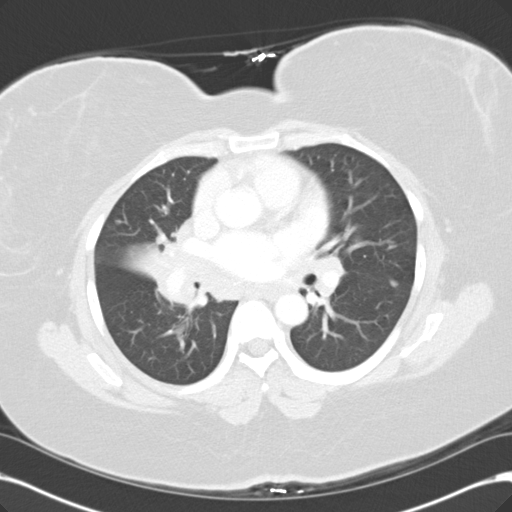
[im 37/59  lung]
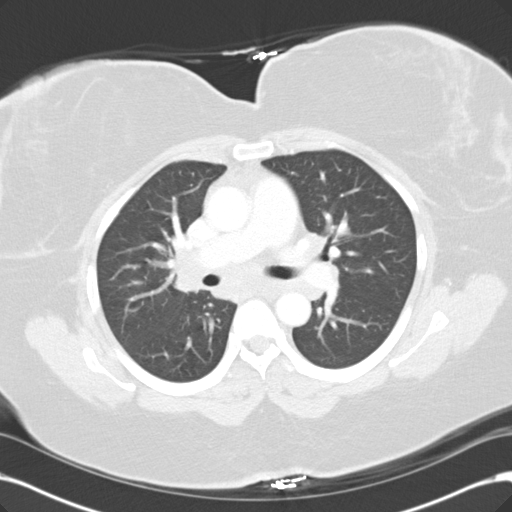
[im 41/59  mediastinal]
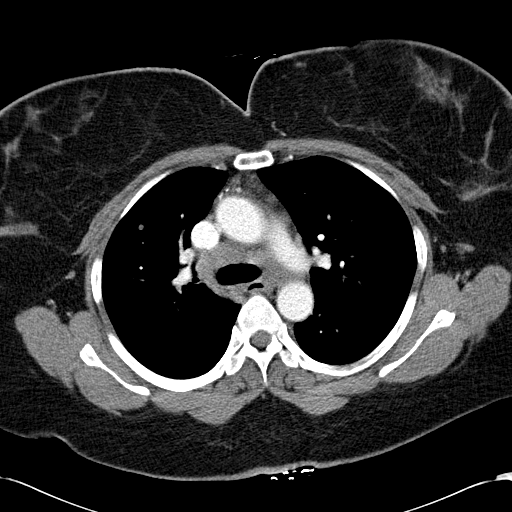
[im 41/59  lung]
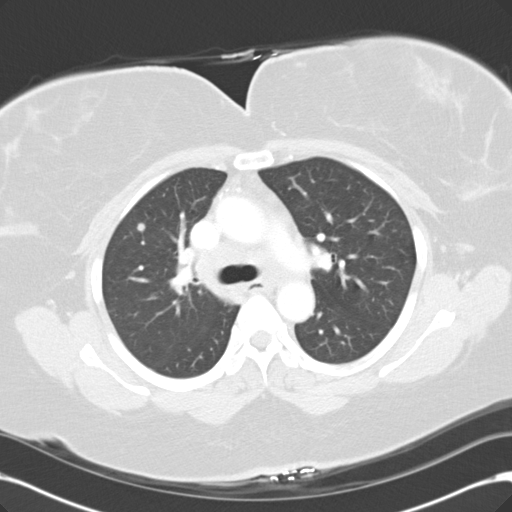
[im 46/59  lung]
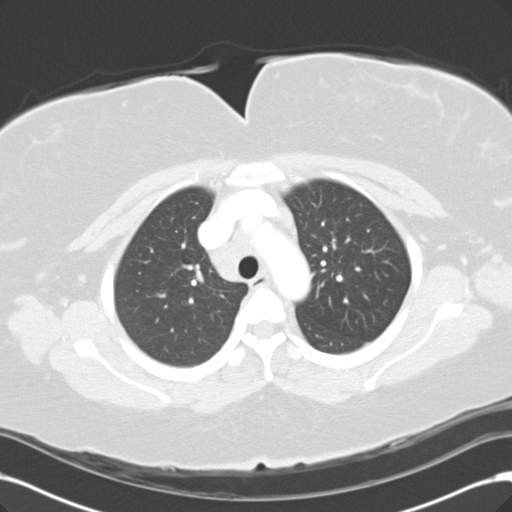
[im 50/59  lung]
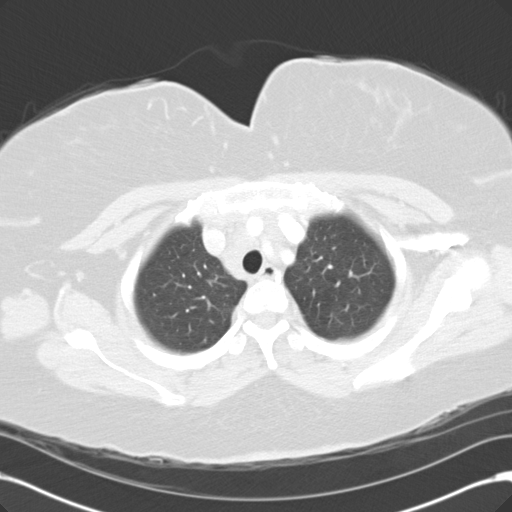
[im 54/59  lung]
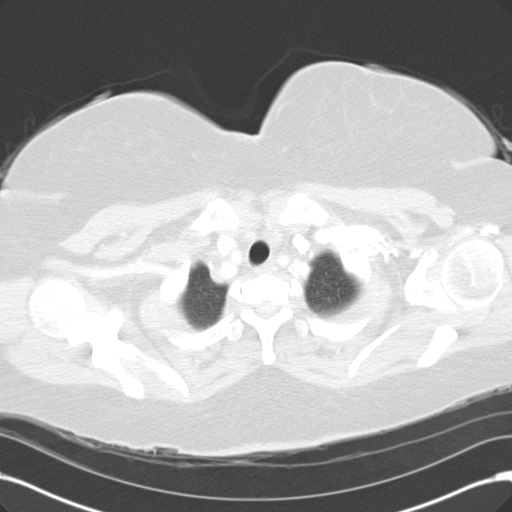

[Series 602: cor · coronal · 0.71mm/px · 3 of 100 slices shown]
[im 20/100  lung]
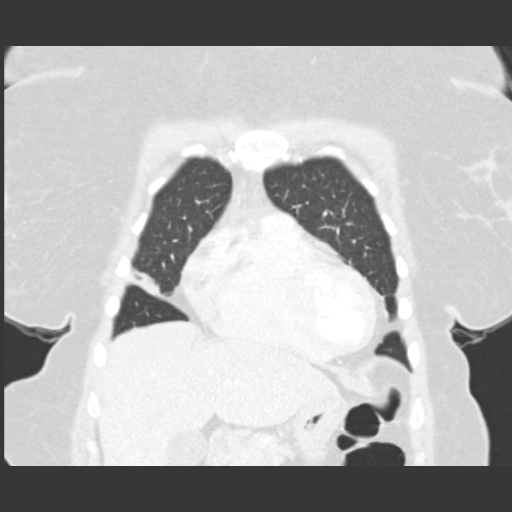
[im 40/100  lung]
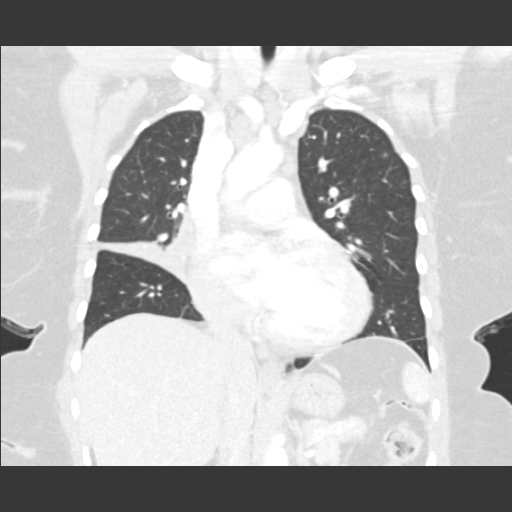
[im 60/100  lung]
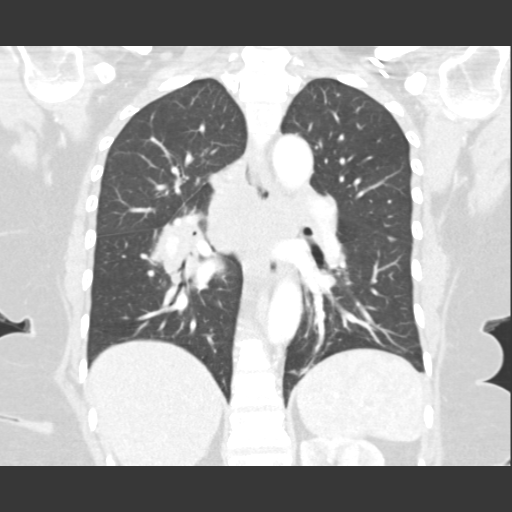

[15 of 36 positions shown; findings below may reference images not displayed]

FINDINGS: Mediastinum: Heart size is mildly enlarged. There is no significant
pericardial fluid, thickening or pericardial calcification.
Extensive bilateral hilar and mediastinal lymphadenopathy, similar
to the prior study. The bulkiest mediastinal lymph nodes include
high right paratracheal lymph nodes measuring 1.4 cm in short axis,
subcarinal lymph nodes measuring 21 mm in short axis, and a right
hilar nodal mass which measures up to 3.6 x 3.7 cm. Esophagus is
unremarkable in appearance.

Lungs/Pleura: Compared to the prior study there is now complete
atelectasis of the right middle lobe, related to obstruction from
the previously described right hilar nodal mass. There are several
small noncalcified pulmonary nodules scattered throughout the lungs
bilaterally which are unchanged in size, number and pattern of
distribution compared to the prior study, with the largest nodule
measuring up to 6 mm in the right upper lobe (image 19 of series 3).
No new suspicious appearing pulmonary nodules or masses are
otherwise noted. No acute consolidative airspace disease. No pleural
effusions.

Upper Abdomen: Several hepatic lesions are noted, including a 1.9 x
1.1 cm intermediate attenuation lesion in segment 2 of the liver
which appears slightly larger than the prior study, a 1.6 x 1.0 cm
intermediate attenuation lesion in segment 8 of the liver which
appears unchanged, a new ill-defined lesion measuring approximately
1.6 x 2.1 cm in the periphery of segment 8 (image 50 of series 2),
and a new 8 mm lesion in segment 3 of the liver which is too small
to characterize.

Musculoskeletal: There are no aggressive appearing lytic or blastic
lesions noted in the visualized portions of the skeleton.
IMPRESSION: 1. The appearance the chest is again compatible with the reported
clinical diagnosis of sarcoidosis. Compared to the prior study, the
right hilar nodal mass now causes complete obstruction of the right
middle lobe bronchus, and there is complete atelectasis of the right
middle lobe.
2. All previously noted pulmonary nodules appear unchanged compared
to the prior study, and presumably benign related to sarcoidosis.
3. Multiple hepatic lesions. Some of these appear stable while
others appear enlarged and others are new compared to the prior
study. These are incompletely characterized on today's examination,
and further evaluation with nonemergent MRI of the abdomen with and
without IV gadolinium is suggested near future to provide definitive
characterization.

## 2016-02-27 ENCOUNTER — Encounter: Payer: Self-pay | Admitting: Pulmonary Disease

## 2016-02-27 ENCOUNTER — Ambulatory Visit (INDEPENDENT_AMBULATORY_CARE_PROVIDER_SITE_OTHER): Payer: Medicaid Other | Admitting: Pulmonary Disease

## 2016-02-27 ENCOUNTER — Telehealth: Payer: Self-pay | Admitting: Adult Health

## 2016-02-27 VITALS — BP 128/74 | HR 94 | Ht 66.0 in | Wt 277.4 lb

## 2016-02-27 DIAGNOSIS — D860 Sarcoidosis of lung: Secondary | ICD-10-CM | POA: Diagnosis not present

## 2016-02-27 MED ORDER — BUDESONIDE-FORMOTEROL FUMARATE 80-4.5 MCG/ACT IN AERO: 2.0000 | INHALATION_SPRAY | Freq: Every day | RESPIRATORY_TRACT | 5 refills | Status: AC

## 2016-02-27 MED FILL — SYMBICORT 80-4.5 MCG INH: 80-4.5 | 30 days supply | Qty: 10 | Fill #0

## 2016-02-27 NOTE — Progress Notes (Signed)
Chief Complaint  Patient presents with  . Follow-up    Pt reports her breathing is stable. C/o slight wheezing, some coughing. Pt reports at the end of June she had coughed up blood for about 3 weeks and then again end of July into august for about 2 weeks. Right now mucus is clear.     Current Outpatient Prescriptions on File Prior to Visit  Medication Sig  . albuterol (PROVENTIL HFA;VENTOLIN HFA) 108 (90 BASE) MCG/ACT inhaler Inhale 1-2 puffs into the lungs every 6 (six) hours as needed for wheezing or shortness of breath. (Patient not taking: Reported on 02/27/2016)   No current facility-administered medications on file prior to visit.     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  BP 128/74 (BP Location: Left Arm, Cuff Size: Large)   Pulse 94   Ht 5\' 6"  (1.676 m)   Wt 277 lb 6.4 oz (125.8 kg)   SpO2 99%   BMI 44.77 kg/m   History of Present Illness: Anna [name] is a 50 y.o. female with sarcoid with pulmonary and ocular invovlement, mild/intermittent asthma, and mild  OSA.  She had a few episodes of cough blood in July and early August.  Not having any more recently.  She ran out of symbicort and felt her breathing was worse.  She started symbicort again and is able to walk more easily.  She is in process of moving to [place].  Physical Exam:  General - No distress ENT - No sinus tenderness, no oral exudate, no LAN Cardiac - s1s2 regular, no murmur Chest - no wheeze/rales Back - No focal tenderness Abd - Soft, non-tender Ext - No edema Neuro - Normal strength Skin - No rashes Psych - normal mood, and behavior  Assessment/Plan:  Hemoptysis. - if recurs, then needs CT angio chest and possible bronchoscopy  Pulmonary sarcoidosis. -  She will need to arrange [place] pulmonary [place] - discussed need for flu shot > she will think about this  Persistent asthma. - continue symbicort and prn albuterol  Mild/moderate mitral regurgitation. - advised her to arrange [place] [facility] [place]    Patient Instructions  Follow up in 3 months if you aren't able to arrange for pulmonary doctor [place] [facility]    Anna Nazaryan, MD [facility]/Critical Care/Sleep Pager:  [phone] 02/27/2016, 2:32 PM

## 2016-02-27 NOTE — Patient Instructions (Signed)
Follow up in 3 months if you aren't able to arrange for pulmonary doctor follow up in [place]

## 2016-02-27 NOTE — Telephone Encounter (Signed)
lmtcb x1 for pt. 

## 2016-02-27 NOTE — Telephone Encounter (Signed)
Spoke with pt. Rx has been sent in. Nothing further was needed. 

## 2016-02-27 NOTE — Telephone Encounter (Signed)
Pt returning call has an appointment today and will talk to nurse when she gets here, said that she just want rx to be ready for pick up @ pharm  when she left office.Caren GriffinsStanley A Dalton

## 2016-03-21 ENCOUNTER — Emergency Department: Admit: 2016-03-22 | Payer: Self-pay | Primary: Family

## 2016-03-21 ENCOUNTER — Encounter: Payer: Self-pay | Admitting: Pulmonary Disease

## 2016-03-21 DIAGNOSIS — R0789 Other chest pain: Secondary | ICD-10-CM

## 2016-03-21 NOTE — ED Notes (Signed)
Bedside and Verbal shift change report given to T. Soyars, RN (oncoming nurse) by L. Holman, RN (offgoing nurse). Report included the following information SBAR.

## 2016-03-21 NOTE — ED Notes (Signed)
Emergency Department Nursing Plan of Care       The Nursing Plan of Care is developed from the Nursing assessment and Emergency Department Attending provider initial evaluation.  The plan of care may be reviewed in the ???ED Provider note???.    The Plan of Care was developed with the following considerations:   Patient / Family readiness to learn indicated [id number] understanding  Persons(s) to be included in education: patient  Barriers to Learning/Limitations:No    Signed     Rogers Blocker, RN    03/21/2016   8:30 PM    See Assessment

## 2016-03-21 NOTE — ED Provider Notes (Signed)
HPI Comments: Heather Sosa is a 50 y.o. female with PMhx significant for sarcoidoisis who presents ambulatory to the ED with cc of progressively worsening episodes of sharp, left sided chest pain which started two days ago.  Pt notes that it "feels different" from the chest pain she normally has with her sarcoidosis. Pt explains that she has been followed by a cardiologist out of town because her "sarcoidosis has traveled to her heart".  She has most recently seen her doctor on 8/28 who recommended she see the cardiologist again. She has been compliant with her medications and follow up.  Pt also c/o left arm numbness, left arm pain, and intermittent episodes of left facial pain.  She explains that she has had numbness before, but it has "never felt like this".  Pt has not recently had any long trips.  She denies SOB, calf pain, and leg swelling.  Pt describes that she is currently taking Prednisone and has an inhaler.  She has no history of cardiac stents.      Social Hx: - Tobacco, - EtOH, - Illicit Drugs    PCP: No primary care provider on file.    There are no other complaints, changes or physical findings at this time.      The history is provided by the patient. No language interpreter was used.        History reviewed. No pertinent past medical history.    History reviewed. No pertinent surgical history.      History reviewed. No pertinent family history.    Social History     Social History   ??? Marital status: SINGLE     Spouse name: N/A   ??? Number of children: N/A   ??? Years of education: N/A     Occupational History   ??? Not on file.     Social History Main Topics   ??? Smoking status: Unknown If Ever Smoked   ??? Smokeless tobacco: Not on file   ??? Alcohol use No   ??? Drug use: No   ??? Sexual activity: Not on file     Other Topics Concern   ??? Not on file     Social History Narrative   ??? No narrative on file         ALLERGIES: Review of patient's allergies indicates no known allergies.    Review of Systems    Constitutional: Negative for fever.   HENT: Negative for sore throat.         + left facial pain   Eyes: Negative for photophobia and redness.   Respiratory: Negative for shortness of breath and wheezing.    Cardiovascular: Positive for chest pain. Negative for leg swelling.   Gastrointestinal: Negative for abdominal pain, blood in stool, nausea and vomiting.   Genitourinary: Negative for difficulty urinating, dysuria, hematuria, menstrual problem and vaginal bleeding.   Musculoskeletal: Positive for arthralgias (left arm). Negative for back pain and joint swelling.   Neurological: Positive for numbness (left arm). Negative for dizziness, seizures, syncope, speech difficulty, weakness and headaches.   Hematological: Negative for adenopathy.   Psychiatric/Behavioral: Negative for agitation, confusion and suicidal ideas. The patient is not nervous/anxious.      Patient Vitals for the past 12 hrs:   Temp Pulse Resp BP SpO2   03/21/16 2230 98.8 ??F (37.1 ??C) 88 18 136/77 98 %   03/21/16 2020 99.1 ??F (37.3 ??C) 85 16 142/83 97 %       Physical Exam  Constitutional: She is oriented to person, place, and time. She appears well-developed and well-nourished. No distress.   HENT:   Head: Normocephalic and atraumatic.   Mouth/Throat: Oropharynx is clear and moist. No oropharyngeal exudate.   Eyes: Conjunctivae and EOM are normal. Pupils are equal, round, and reactive to light. Left eye exhibits no discharge.   Neck: Normal range of motion. Neck supple. No JVD present.   Cardiovascular: Normal rate, regular rhythm, normal heart sounds and intact distal pulses.    Pulmonary/Chest: Effort normal and breath sounds normal. No respiratory distress. She has no wheezes.   Abdominal: Soft. Bowel sounds are normal. She exhibits no distension. There is no tenderness. There is no rebound and no guarding.   Musculoskeletal: Normal range of motion. She exhibits no edema or tenderness.   Lymphadenopathy:     She has no cervical adenopathy.    Neurological: She is alert and oriented to person, place, and time. She has normal reflexes. No cranial nerve deficit.   Skin: Skin is warm and dry. No rash noted.   Psychiatric: She has a normal mood and affect. Her behavior is normal.   Nursing note and vitals reviewed.       MDM  Number of Diagnoses or Management Options  Diagnosis management comments:   DDx: acute coronary syndrome, sarcoidosis exacerbation, pleurisy, costochondritis, PE, pneumonia        Amount and/or Complexity of Data Reviewed  Clinical lab tests: reviewed and ordered  Tests in the radiology section of CPT??: ordered and reviewed  Tests in the medicine section of CPT??: ordered and reviewed  Review and summarize past medical records: yes  Independent visualization of images, tracings, or specimens: yes    Patient Progress  Patient progress: stable    ED Course       Procedures     EKG interpretation: (Preliminary) 2119  Rhythm: normal sinus rhythm; and regular . Rate (approx.): 80; Axis: normal; PR interval: normal; QRS interval: normal ; ST/T wave: normal; Other findings: minimal voltage criteria for left ventricular hypertrophy, may be normal variant.  Written by Delight Hoh, ED Scribe as dictated by Belva Crome, MD    LABORATORY TESTS:  Recent Results (from the past 12 hour(s))   CBC WITH AUTOMATED DIFF    Collection Time: 03/21/16  9:35 PM   Result Value Ref Range    WBC 5.4 3.6 - 11.0 K/uL    RBC 4.69 3.80 - 5.20 M/uL    HGB 11.7 11.5 - 16.0 g/dL    HCT 16.1 09.6 - 04.5 %    MCV 79.1 (L) 80.0 - 99.0 FL    MCH 24.9 (L) 26.0 - 34.0 PG    MCHC 31.5 30.0 - 36.5 g/dL    RDW 40.9 (H) 81.1 - 14.5 %    PLATELET 233 150 - 400 K/uL    NEUTROPHILS 59 32 - 75 %    LYMPHOCYTES 25 12 - 49 %    MONOCYTES 8 5 - 13 %    EOSINOPHILS 8 (H) 0 - 7 %    BASOPHILS 0 0 - 1 %    ABS. NEUTROPHILS 3.2 1.8 - 8.0 K/UL    ABS. LYMPHOCYTES 1.3 0.8 - 3.5 K/UL    ABS. MONOCYTES 0.5 0.0 - 1.0 K/UL    ABS. EOSINOPHILS 0.4 0.0 - 0.4 K/UL     ABS. BASOPHILS 0.0 0.0 - 0.1 K/UL   METABOLIC PANEL, BASIC    Collection Time: 03/21/16  9:35 PM  Result Value Ref Range    Sodium 137 136 - 145 mmol/L    Potassium 4.0 3.5 - 5.1 mmol/L    Chloride 101 97 - 108 mmol/L    CO2 27 21 - 32 mmol/L    Anion gap 9 5 - 15 mmol/L    Glucose 79 65 - 100 mg/dL    BUN 14 6 - 20 MG/DL    Creatinine 1.610.95 0.960.55 - 1.02 MG/DL    BUN/Creatinine ratio 15 12 - 20      GFR est AA >60 >60 ml/min/1.6673m2    GFR est non-AA >60 >60 ml/min/1.3173m2    Calcium 9.1 8.5 - 10.1 MG/DL   TROPONIN I    Collection Time: 03/21/16  9:35 PM   Result Value Ref Range    Troponin-I, Qt. <0.04 <0.05 ng/mL       IMAGING RESULTS:  XR CHEST PA LAT   Final Result   INDICATION: Shortness of breath. History of sarcoidosis.  ??  FINDINGS: PA and lateral views of the chest demonstrate a normal heart size.  There is bilateral hilar enlargement.. Small pulmonary nodules are seen in both  upper lobes. There is no focal consolidation or pleural effusion. The visualized  osseous structures are unremarkable.  ??  IMPRESSION  IMPRESSION: Bilateral hilar enlargement is compatible with the history of  sarcoidosis. Bilateral upper lobe pulmonary nodules; correlation with prior  imaging would be helpful. Alternatively, chest CT could be performed for further  evaluation.     CT Results  (Last 48 hours)               03/21/16 2343  CTA CHEST W OR W WO CONT Final result    Impression:   impression: No acute findings. Mediastinal and right hilar mass. Pulmonary   nodules.       Narrative:  Clinical indication: Left side chest pain.       Localizer obtained without contrast at the level of the pulmonary arteries. Fast   injection rate of 80 cc of Isovue-370 with review of the raw data and MIP   reconstructions . CT dose reduction was achieved through the use of a   standardized protocol tailored for this examination and automatic exposure   control for dose modulation . Marland Kitchen.         The heart size is prominent. There is no pulmonary embolism. There is no   pericardial pleural effusion no shift or pneumothorax. There are multiple small   pulmonary nodule. There is a large right hilar mass. Multiple mediastinal   enlarged nodes are seen. There also some hilar nodes bilaterally. Malignancy to   be excluded.                 MEDICATIONS GIVEN:  Medications   sodium chloride (NS) flush 5-10 mL (not administered)   sodium chloride (NS) flush 5-10 mL (not administered)   0.9% sodium chloride infusion (150 mL/hr IntraVENous New Bag 03/21/16 2150)   ketorolac (TORADOL) injection 30 mg (30 mg IntraVENous Given 03/21/16 2150)   iopamidol (ISOVUE-370) 76 % injection 100 mL (80 mL IntraVENous Given 03/21/16 2344)   sodium chloride (NS) flush 10 mL (10 mL IntraVENous Given 03/21/16 2344)       IMPRESSION:  1. Atypical chest pain        PLAN:  1.   Current Discharge Medication List      START taking these medications    Details   traMADol (ULTRAM) 50 mg tablet  Take 1 Tab by mouth every six (6) hours as needed for Pain for up to 10 days. Max Daily Amount: 200 mg.  Qty: 20 Tab, Refills: 0           2.   Follow-up Information     Follow up With Details Comments Contact Info    [facility] In 1 week  [address]  [place] [address]  [phone]        Return to ED if worse     DISCHARGE NOTE  12:23 AM  The patient has been re-evaluated and is ready for discharge. Reviewed available results with patient. Counseled patient on diagnosis and care plan. Patient has expressed understanding, and all questions have been answered. Patient agrees with plan and agrees to follow up as recommended, or return to the ED if their symptoms worsen. Discharge instructions have been provided and explained to the patient, along with reasons to return to the ED.    Attestation Note:  This note is prepared by R. Darcey Nora, acting as Neurosurgeon for Belva Crome, MD     Belva Crome, MD: The scribe's documentation has been prepared under my direction and personally reviewed by me in its entirety. I confirm that the note above accurately reflects all work, treatment, procedures, and medical decision making performed by me.

## 2016-03-22 ENCOUNTER — Inpatient Hospital Stay
Admit: 2016-03-22 | Discharge: 2016-03-22 | Disposition: A | Discharge status: Home / Self Care | Payer: Self-pay | Attending: Emergency Medicine

## 2016-03-22 ENCOUNTER — Telehealth: Payer: Self-pay | Admitting: Pulmonary Disease

## 2016-03-22 LAB — EKG 12-LEAD
Atrial Rate: 80 {beats}/min
Diagnosis: NORMAL
P Axis: 42 degrees
P-R Interval: 198 ms
Q-T Interval: 400 ms
QRS Duration: 88 ms
QTc Calculation (Bazett): 461 ms
R Axis: -23 degrees
T Axis: 69 degrees
Ventricular Rate: 80 {beats}/min

## 2016-03-22 LAB — METABOLIC PANEL, BASIC
Anion gap: 9 mmol/L (ref 5–15)
BUN/Creatinine ratio: 15 (ref 12–20)
BUN: 14 MG/DL (ref 6–20)
CO2: 27 mmol/L (ref 21–32)
Calcium: 9.1 MG/DL (ref 8.5–10.1)
Chloride: 101 mmol/L (ref 97–108)
Creatinine: 0.95 MG/DL (ref 0.55–1.02)
GFR est AA: >60 mL/min/{1.73_m2} (ref >60)
GFR est non-AA: >60 mL/min/{1.73_m2} (ref >60)
Glucose: 79 mg/dL (ref 65–100)
Potassium: 4 mmol/L (ref 3.5–5.1)
Sodium: 137 mmol/L (ref 136–145)

## 2016-03-22 LAB — CBC WITH AUTOMATED DIFF
ABS. BASOPHILS: 0 10*3/uL (ref 0.0–0.1)
ABS. EOSINOPHILS: 0.4 10*3/uL (ref 0.0–0.4)
ABS. LYMPHOCYTES: 1.3 10*3/uL (ref 0.8–3.5)
ABS. MONOCYTES: 0.5 10*3/uL (ref 0.0–1.0)
ABS. NEUTROPHILS: 3.2 10*3/uL (ref 1.8–8.0)
BASOPHILS: 0 % (ref 0–1)
EOSINOPHILS: 8 % — ABNORMAL HIGH (ref 0–7)
HCT: 37.1 % (ref 35.0–47.0)
HGB: 11.7 g/dL (ref 11.5–16.0)
LYMPHOCYTES: 25 % (ref 12–49)
MCH: 24.9 PG — ABNORMAL LOW (ref 26.0–34.0)
MCHC: 31.5 g/dL (ref 30.0–36.5)
MCV: 79.1 FL — ABNORMAL LOW (ref 80.0–99.0)
MONOCYTES: 8 % (ref 5–13)
NEUTROPHILS: 59 % (ref 32–75)
PLATELET: 233 10*3/uL (ref 150–400)
RBC: 4.69 M/uL (ref 3.80–5.20)
RDW: 14.8 % — ABNORMAL HIGH (ref 11.5–14.5)
WBC: 5.4 10*3/uL (ref 3.6–11.0)

## 2016-03-22 LAB — TROPONIN I: Troponin-I, Qt.: <0.04 ng/mL (ref <0.05)

## 2016-03-22 LAB — EKG, 12 LEAD, INITIAL
Atrial Rate: 80 {beats}/min
Calculated P Axis: 42 degrees
Calculated R Axis: -23 degrees
Calculated T Axis: 69 degrees
Diagnosis: NORMAL
P-R Interval: 198 ms
Q-T Interval: 400 ms
QRS Duration: 88 ms
QTC Calculation (Bezet): 461 ms
Ventricular Rate: 80 {beats}/min

## 2016-03-22 MED ORDER — KETOROLAC TROMETHAMINE 30 MG/ML INJECTION
30 mg/mL (1 mL) | INTRAMUSCULAR | Status: AC
Start: 2016-03-22 — End: 2016-03-21
  Administered 2016-03-22: 02:00:00 via INTRAVENOUS

## 2016-03-22 MED ORDER — SODIUM CHLORIDE 0.9 % IV
INTRAVENOUS | Status: DC
Start: 2016-03-22 — End: 2016-03-22
  Administered 2016-03-22: 02:00:00 via INTRAVENOUS

## 2016-03-22 MED ORDER — SODIUM CHLORIDE 0.9 % IJ SYRG
Freq: Once | INTRAMUSCULAR | Status: AC
Start: 2016-03-22 — End: 2016-03-21
  Administered 2016-03-22: 04:00:00 via INTRAVENOUS

## 2016-03-22 MED ORDER — TRAMADOL 50 MG TAB
50 mg | ORAL_TABLET | Freq: Four times a day (QID) | ORAL | 0 refills | Status: AC | PRN
Start: 2016-03-22 — End: 2016-04-01

## 2016-03-22 MED ORDER — SODIUM CHLORIDE 0.9 % IJ SYRG
INTRAMUSCULAR | Status: DC | PRN
Start: 2016-03-22 — End: 2016-03-22

## 2016-03-22 MED ORDER — IOPAMIDOL 76 % IV SOLN
370 mg iodine /mL (76 %) | Freq: Once | INTRAVENOUS | Status: AC
Start: 2016-03-22 — End: 2016-03-21
  Administered 2016-03-22: 04:00:00 via INTRAVENOUS

## 2016-03-22 MED ORDER — SODIUM CHLORIDE 0.9 % IJ SYRG
Freq: Three times a day (TID) | INTRAMUSCULAR | Status: DC
Start: 2016-03-22 — End: 2016-03-22

## 2016-03-22 MED FILL — ISOVUE-370  76 % INTRAVENOUS SOLUTION: 370 mg iodine /mL (76 %) | INTRAVENOUS | Qty: 100

## 2016-03-22 MED FILL — KETOROLAC TROMETHAMINE 30 MG/ML INJECTION: 30 mg/mL (1 mL) | INTRAMUSCULAR | Qty: 1

## 2016-03-22 MED FILL — BD POSIFLUSH NORMAL SALINE 0.9 % INJECTION SYRINGE: INTRAMUSCULAR | Qty: 10

## 2016-03-22 MED FILL — SODIUM CHLORIDE 0.9 % IV: INTRAVENOUS | Qty: 1000

## 2016-03-22 NOTE — ED Notes (Signed)
Patient given copy of dc instructions and one script(s).  Patient verbalized understanding of instructions and script (s).  Patient given a current medication reconciliation form and verbalized understanding of their medications.   Patient verbalized understanding of the importance of discussing medications with  his or her physician or clinic when they follow up.  Patient alert and oriented and in no acute distress.  Pt verbalizes pain scale of 5 out of 10.  Patient discharged home ambulatory without assistance. Wheelchair declined.

## 2016-03-22 NOTE — Telephone Encounter (Signed)
Spoke with pt. She reports she was seen in the [place] in [place]. Reports they did CT scan, blood, xrays. She is requesting we request these records. [facility] [phone].  Called the hospital and was advised pt will need a signed release before they can release anything to our office.  I called spoke with pt and made her aware of the above. She will do so and call us back once this is done. Nothing further needed at this time.

## 2016-03-26 ENCOUNTER — Telehealth: Payer: Self-pay | Admitting: Pulmonary Disease

## 2016-03-26 NOTE — Telephone Encounter (Signed)
Called and spoke with pt and she stated that she was not able to get up to [facility] on Friday to sign the release to have her records faxed to [facility].  She stated that she called them today and they told her that we could fax over a note on a letter head or fax cover sheet to them to request these records.    This has been faxed and I will forward to Ashtyn to follow up on records from [facility].

## 2016-03-30 NOTE — Telephone Encounter (Signed)
Spoke with pt, advised that we were still waiting on results to be faxed to our office, and that the request was sent 4 days ago.  Advised that we would call with results as we have them.  Pt expressed understanding.  Anna [name] have you received these records, or do they need to be re-requested?  thanks

## 2016-03-30 NOTE — Telephone Encounter (Signed)
Have we received these records?

## 2016-03-30 NOTE — Telephone Encounter (Signed)
Looked in Dr Kathaleen BurySood'd cubby and there is a record request to [facility] but there was no patient information written on it. Spoke with Dr Craige CottaSood and this was supposed to be faxed for this patient to get records.  Patient's information was written on records request and this has been re-faxed to [phone].

## 2016-03-30 NOTE — Telephone Encounter (Signed)
Patient calling for xray and CT scan results - pr

## 2016-04-04 NOTE — Telephone Encounter (Signed)
Checked VS look at. These records have not been received yet. Will await fax.

## 2016-04-06 NOTE — Telephone Encounter (Signed)
ashtyn have these records been received yet?  thanks

## 2016-04-10 NOTE — Telephone Encounter (Signed)
Records have been received and have been given to Dr Craige CottaSood. Will send to Dr Craige CottaSood.

## 2016-04-11 NOTE — Telephone Encounter (Signed)
Called spoke with pt. Reviewed VS' recs. She voiced understanding and had no further questions. She states that she does not have a physician in [place]. She states she is confused by recs and would like a call from [facility] for clara fication. I explained to her that I would send the message to [facility]. She voiced understanding and had no further questions.   VS please advise

## 2016-04-11 NOTE — Telephone Encounter (Signed)
Please let Ms. Anna [name] know I have reviewed her chest xray and CT scan report.  She has changes of sarcoidosis based on report findings.  Ideally she needs to have CT images from [place] directly compared to CT images from [place] to make sure there hasn't been any progression of her sarcoidosis.  She should request a disk be made with her chest imaging studies [facility] and have this reviewed by her physician in [place].

## 2016-04-13 NOTE — Telephone Encounter (Signed)
Pt calling requesting to speak to dr Craige CottaSood himself about results, says she didn't understand what nurse was saying.Anna GriffinsStanley A [name]

## 2016-04-13 NOTE — Telephone Encounter (Signed)
Spoke with pt.  She will get copy of images on disk and have sent to me.  In meantime she is working on Theatre managergetting medical team established [place].

## 2016-05-16 ENCOUNTER — Telehealth: Payer: Self-pay | Admitting: Pulmonary Disease

## 2016-05-16 DIAGNOSIS — D860 Sarcoidosis of lung: Secondary | ICD-10-CM

## 2016-05-16 DIAGNOSIS — R042 Hemoptysis: Secondary | ICD-10-CM

## 2016-05-16 MED ORDER — PREDNISONE 10 MG PO TABS: ORAL_TABLET | ORAL | 0 refills | Status: AC

## 2016-05-16 NOTE — Telephone Encounter (Signed)
Pt would like to speak to a nurse.Charm RingsErica R Taylor

## 2016-05-16 NOTE — Telephone Encounter (Signed)
Spoke with pt. States that she has already been to the emergency room and has a pending appointment with a pulmonologist in [place], which she can't keep because they don't accept her insurance. Pt was very demanding, stating, "I just need medicine." I offered her an appointment with us on Friday due to her stating she would be in [place] on Friday but she rudely declined. She would like to have a pred taper sent to her pharmacy.  VS - please advise. Thanks.

## 2016-05-16 NOTE — Telephone Encounter (Signed)
Spoke with patient.  She hasn't been able to switch to [place] medicaid yet, and therefore can't get doctor appointment there yet.  She started coughing blood again about 2 weeks ago.  Denies fever, chest pain, wheeze.  Will send in script for prednisone and arrange for CT chest with IV contrast.  She will call if she can get something set up in [place], but otherwise will plan to address in [place].  Advised her to go to ER immediately if her symptoms get worse.

## 2016-05-16 NOTE — Telephone Encounter (Signed)
Pt states she wants to get Prednisone Rx from [facility] as she is not able to see Pulmon[place]ist in [place] unless she changes MD's all together and they do not take her health insurnance. Pt can not get here Friday until after 4:30pm to possibly be seen.   Pt does not prefer to go to ER. Pt would like to speak with VS directly so she can explain further information to him.

## 2016-05-16 NOTE — Telephone Encounter (Signed)
She needs to find a lung doctor in [place].  If she can't do this expeditiously, then she should go to the emergency room for further assessment.

## 2016-05-16 NOTE — Telephone Encounter (Signed)
Spoke with pt, c/o SOB, prod cough with worsening hemoptysis X2 weeks.  Cough is worse qam and laying down at night. I tried to schedule appt for pt, states that she cannot come in for an appt this week as she is in [place].    Denies fever, chest pain.  Pt requesting a stronger and longer pred taper.     [facility]    VS please advise.  Thanks.

## 2016-05-17 MED FILL — predniSONE 10 MG TABS: 10 | 30 days supply | Qty: 60 | Fill #0

## 2016-05-18 ENCOUNTER — Inpatient Hospital Stay: Admission: RE | Admit: 2016-05-18 | Payer: Medicaid Other | Source: Ambulatory Visit

## 2016-05-18 MED FILL — SYMBICORT 80-4.5 MCG INH: 80-4.5 | 30 days supply | Qty: 10 | Fill #1

## 2016-05-22 ENCOUNTER — Inpatient Hospital Stay: Admission: RE | Admit: 2016-05-22 | Payer: Medicaid Other | Source: Ambulatory Visit

## 2016-05-28 ENCOUNTER — Telehealth: Payer: Self-pay | Admitting: Pulmonary Disease

## 2016-05-28 NOTE — Telephone Encounter (Signed)
Patient returned phone call.  Contact #  [phone].Marland Kitchen.Charm RingsErica R Taylor

## 2016-05-28 NOTE — Telephone Encounter (Signed)
Noted  

## 2016-05-28 NOTE — Telephone Encounter (Signed)
LM x 1 

## 2016-05-28 NOTE — Telephone Encounter (Signed)
Pt states that she is going to see a lung specialist in [place], pt states that this will be closer to her and more convenient. Pt states that she is needing records faxed to the new office.  Pt aware that she needs to contact [facility] at [facility] to have them fax over this information. Patient given number and address of [facility]. Nothing further needed.  Will send to Dr Craige CottaSood to make him aware that the patient is moving transferring care.

## 2016-05-29 ENCOUNTER — Ambulatory Visit: Payer: Medicaid Other | Admitting: Pulmonary Disease

## 2016-06-07 ENCOUNTER — Encounter: Attending: Family | Primary: Family

## 2016-06-14 ENCOUNTER — Ambulatory Visit: Admit: 2016-06-14 | Discharge: 2016-06-14 | Attending: Family | Primary: Family

## 2016-06-14 DIAGNOSIS — D869 Sarcoidosis, unspecified: Secondary | ICD-10-CM

## 2016-06-14 NOTE — Progress Notes (Signed)
Subjective: (As above and below)     Chief Complaint   Patient presents with   ??? New Patient   ??? Establish Care     Heather Sosa is a 50 y.o. year old female who presents to establish care. She is from [place] but has been living in [place] for the past 10+ years.     She has a history significant for sarcoidosis. She has an appointment with a pulmonologist and [facility]. She offers no complaints today. She asks about referral for cardiology. She reports hx of high cholesterol, but not on meds. She states that she was est with cardio in [place] for yearly echocardiograms per her sarcoid specialist recs. She states last echo approx 1 year go - she does not know results other than "having hardening". She is not on any cardiac meds.    She is currently finished a steroid taper and is taking inhalers as rx'd. States she does not use rescue inhaler very much.        Reviewed PmHx, RxHx, FmHx, SocHx, AllgHx and updated in chart.  Family History   Problem Relation Age of Onset   ??? Cancer Maternal Grandmother      unknown   ??? Cancer Paternal Grandmother      breast       Past Medical History:   Diagnosis Date   ??? High cholesterol    ??? Sarcoid       Social History     Social History   ??? Marital status: DIVORCED     Spouse name: N/A   ??? Number of children: N/A   ??? Years of education: N/A     Social History Main Topics   ??? Smoking status: Never Smoker   ??? Smokeless tobacco: Never Used   ??? Alcohol use No   ??? Drug use: No   ??? Sexual activity: No     Other Topics Concern   ??? None     Social History Narrative          Current Outpatient Prescriptions   Medication Sig   ??? predniSONE (DELTASONE) 5 mg tablet Take  by mouth.   ??? budesonide-formoterol (SYMBICORT) 160-4.5 mcg/actuation HFAA Take 2 Puffs by inhalation two (2) times a day.   ??? albuterol (PROAIR HFA) 90 mcg/actuation inhaler Take  by inhalation.     No current facility-administered medications for this visit.        Review of Systems:    Constitutional:    Negative for fever and chills, negative diaphoresis.   HEENT:              Negative for neck pain and stiffness.  Eyes:                  Negative for visual disturbance, itching, redness or discharge.   Respiratory:        Negative for cough and shortness of breath.   Cardiovascular:  Negative for chest pain and palpitations.   Gastrointestinal: Negative for nausea, vomiting, abdominal pain, diarrhea or constipation.  Genitourinary:     Negative for dysuria and frequency.   Musculoskeletal: Negative for falls, tenderness and swelling.  Skin:                    Negative for rash, masses or lesions.   Neurological:       Negative for dizzyness, seizure, loss of consciousness, weakness and numbness.     Objective:  Vitals:    06/14/16 1408   BP: 125/76   Pulse: 84   Resp: 20   Temp: 98 ??F (36.7 ??C)   TempSrc: Oral   SpO2: 98%   Weight: 284 lb (128.8 kg)   Height: 5\' 6"  (1.676 m)       Results for orders placed or performed during the hospital encounter of 03/21/16   CBC WITH AUTOMATED DIFF   Result Value Ref Range    WBC 5.4 3.6 - 11.0 K/uL    RBC 4.69 3.80 - 5.20 M/uL    HGB 11.7 11.5 - 16.0 g/dL    HCT 16.137.1 09.635.0 - 04.547.0 %    MCV 79.1 (L) 80.0 - 99.0 FL    MCH 24.9 (L) 26.0 - 34.0 PG    MCHC 31.5 30.0 - 36.5 g/dL    RDW 40.914.8 (H) 81.111.5 - 14.5 %    PLATELET 233 150 - 400 K/uL    NEUTROPHILS 59 32 - 75 %    LYMPHOCYTES 25 12 - 49 %    MONOCYTES 8 5 - 13 %    EOSINOPHILS 8 (H) 0 - 7 %    BASOPHILS 0 0 - 1 %    ABS. NEUTROPHILS 3.2 1.8 - 8.0 K/UL    ABS. LYMPHOCYTES 1.3 0.8 - 3.5 K/UL    ABS. MONOCYTES 0.5 0.0 - 1.0 K/UL    ABS. EOSINOPHILS 0.4 0.0 - 0.4 K/UL    ABS. BASOPHILS 0.0 0.0 - 0.1 K/UL   METABOLIC PANEL, BASIC   Result Value Ref Range    Sodium 137 136 - 145 mmol/L    Potassium 4.0 3.5 - 5.1 mmol/L    Chloride 101 97 - 108 mmol/L    CO2 27 21 - 32 mmol/L    Anion gap 9 5 - 15 mmol/L    Glucose 79 65 - 100 mg/dL    BUN 14 6 - 20 MG/DL    Creatinine 9.140.95 7.820.55 - 1.02 MG/DL     BUN/Creatinine ratio 15 12 - 20      GFR est AA >60 >60 ml/min/1.7973m2    GFR est non-AA >60 >60 ml/min/1.3273m2    Calcium 9.1 8.5 - 10.1 MG/DL   TROPONIN I   Result Value Ref Range    Troponin-I, Qt. <0.04 <0.05 ng/mL   EKG, 12 LEAD, INITIAL   Result Value Ref Range    Ventricular Rate 80 BPM    Atrial Rate 80 BPM    P-R Interval 198 ms    QRS Duration 88 ms    Q-T Interval 400 ms    QTC Calculation (Bezet) 461 ms    Calculated P Axis 42 degrees    Calculated R Axis -23 degrees    Calculated T Axis 69 degrees    Diagnosis       Normal sinus rhythm  Minimal voltage criteria for LVH, may be normal variant  No previous ECGs available  Confirmed by Merlene Laughter[place], [place] [phone]([id number]) on 03/22/2016 9:58:07 AM           Physical Examination: General appearance - alert, well appearing, and in no distress  Mental status - alert, oriented to person, place, and time  Ears - bilateral TM's and external ear canals normal  Chest - clear to auscultation,  rales or rhonchi, symmetric air entry. Faint exp wheeze RLL  Heart - normal rate, regular rhythm, normal S1, S2, no murmurs, rubs, clicks or gallops  Neurological - alert, oriented, normal speech, no focal  findings or movement disorder noted  Extremities -No lower extremity edema      Assessment/ Plan:   Follow-up Disposition:  Return in about 3 months (around 09/12/2016), or if symptoms worsen or fail to improve.     Will work on records request from previous providers in [place]    1. Sarcoid    - REFERRAL TO PULMONARY DISEASE  - REFERRAL TO OPHTHALMOLOGY  - REFERRAL TO CARDIOLOGY    2. Screening for breast cancer    - MAM MAMMO BI SCREENING INCL CAD; Future    3. Hyperlipidemia, unspecified hyperlipidemia type    - LIPID PANEL    4. BMI 45.0-49.9, adult [facility])        I have discussed the diagnosis with the patient and the intended plan as seen in the above orders.  The patient has received an after-visit summary and questions were answered concerning future plans.  Pt conveyed  understanding of plan.      Medication Side Effects and Warnings were discussed with patient: yes  Patient Labs were reviewed: yes  Patient Past Records were reviewed:  yes    Jaymarie Yeakel Z. Doreatha Massed, NP

## 2016-06-14 NOTE — Progress Notes (Signed)
1. Have you been to the ER, urgent care clinic since your last visit?  Hospitalized since your last visit?Yes When: 03/21/16 [facility] ED chest pain.    2. Have you seen or consulted any other health care providers outside of the [facility] since your last visit?  Include any pap smears or colon screening. No.

## 2016-06-14 NOTE — Patient Instructions (Signed)
1. Will request records and follow up on lab work from there    2. Mammogram    3. Go go lung and eye dr as scheduled    4. Call to set up cardiac appt here    5. If your symbicort dose is different than what we have, please call our office to let us know

## 2016-06-15 LAB — LIPID PANEL
Cholesterol, total: 225 mg/dL — ABNORMAL HIGH (ref 100–199)
HDL Cholesterol: 55 mg/dL (ref >39)
LDL, calculated: 147 mg/dL — ABNORMAL HIGH (ref 0–99)
Triglyceride: 113 mg/dL (ref 0–149)
VLDL, calculated: 23 mg/dL (ref 5–40)

## 2016-06-15 LAB — CVD REPORT

## 2016-06-19 NOTE — Telephone Encounter (Signed)
Spoke with patient and she don't have a pham here in [place] yet, she is waiting on her insurance before looking for one, she will call back once her ins come though

## 2016-06-19 NOTE — Telephone Encounter (Signed)
Patient called back with the information that was requested, she had her mammo and pap at [facility] in [place], [phone], and she states that she is taking symbicort 80/4.5 not the one on file, she also stated that she needs a back up inhaler for SOB

## 2016-07-05 ENCOUNTER — Ambulatory Visit: Payer: Self-pay | Primary: Family

## 2016-07-19 ENCOUNTER — Ambulatory Visit: Payer: Self-pay | Primary: Family

## 2016-07-19 NOTE — Telephone Encounter (Signed)
??   Pt is requesting to speak to the nurse. Pt was sent a letter about her cholesterol.   Best contact: [phone]    ??   ??

## 2016-07-20 NOTE — Telephone Encounter (Signed)
Called no answer, left message for patient to return writers call Gillermina HuVanessa V Lafonda Patron LPN

## 2016-07-23 NOTE — Telephone Encounter (Signed)
Patient called and is return the nurse's call. The contact number is [phone].

## 2016-07-24 NOTE — Telephone Encounter (Signed)
Spoke to patient, she states she wanted to know about her cholesterol, I advise her per provider about diet and excersize and that we are waiting to get Labs back from her other Dr's Regarding her last cholesterol levels and liver levels before the provider will start her on medications, also notified her that after we get those results provider will notify patient of what to do next, Pt Understood instructions Heather Sosa Heather Loth LPN

## 2016-08-09 ENCOUNTER — Ambulatory Visit
Admit: 2016-08-09 | Discharge: 2016-08-09 | Payer: PRIVATE HEALTH INSURANCE | Attending: Cardiovascular Disease | Primary: Family

## 2016-08-09 ENCOUNTER — Inpatient Hospital Stay: Admit: 2016-08-09 | Payer: MEDICAID | Attending: Family | Primary: Family

## 2016-08-09 DIAGNOSIS — Z1231 Encounter for screening mammogram for malignant neoplasm of breast: Secondary | ICD-10-CM

## 2016-08-09 DIAGNOSIS — G4733 Obstructive sleep apnea (adult) (pediatric): Secondary | ICD-10-CM

## 2016-08-09 NOTE — Patient Instructions (Signed)
I have asked for an echocardiogram, to include injected contrast for the best possible pictures. I also asked for 3D exam for best possible information. If it is done here, you are entitled to a DVD of the study to carry with you to other doctors.     I did not recommend any changes in your medications today.    Please feel free to call with any questions and ask other treating doctors to communicate with me.

## 2016-08-09 NOTE — Progress Notes (Signed)
Heather Sosa is a 51 year old female referred by APN Nakoneczny for cardiac assessment.  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  She has been followed with serial echoes.  She recently relocated here from [place] and needs to establish care. First diagnosis was from eye exam in [facility] in 2000.     Her 12-lead EKG from September 2017 is normal without any form of conduction defect.    Chest x-ray from September 2017 shows massive bilateral hilar adenopathy with tracheal deviation suggesting further adenopathy concealed in the mediastinum.           She is microcytic with minimal anemia:    Results for Heather Sosa, Heather Sosa (MRN [id number]) as of 08/09/2016 11:51   Ref. Range 03/21/2016 21:35   WBC Latest Ref Range: 3.6 - 11.0 K/uL 5.4   RBC Latest Ref Range: 3.80 - 5.20 M/uL 4.69   HGB Latest Ref Range: 11.5 - 16.0 g/dL 45.4   HCT Latest Ref Range: 35.0 - 47.0 % 37.1   MCV Latest Ref Range: 80.0 - 99.0 FL 79.1 (L)   MCH Latest Ref Range: 26.0 - 34.0 PG 24.9 (L)   MCHC Latest Ref Range: 30.0 - 36.5 g/dL 09.8   RDW Latest Ref Range: 11.5 - 14.5 % 14.8 (H)   PLATELET Latest Ref Range: 150 - 400 K/uL 233   NEUTROPHILS Latest Ref Range: 32 - 75 % 59   LYMPHOCYTES Latest Ref Range: 12 - 49 % 25   MONOCYTES Latest Ref Range: 5 - 13 % 8   EOSINOPHILS Latest Ref Range: 0 - 7 % 8 (H)   BASOPHILS Latest Ref Range: 0 - 1 % 0   ABS. NEUTROPHILS Latest Ref Range: 1.8 - 8.0 K/UL 3.2   ABS. LYMPHOCYTES Latest Ref Range: 0.8 - 3.5 K/UL 1.3   ABS. MONOCYTES Latest Ref Range: 0.0 - 1.0 K/UL 0.5   ABS. EOSINOPHILS Latest Ref Range: 0.0 - 0.4 K/UL 0.4   ABS. BASOPHILS Latest Ref Range: 0.0 - 0.1 K/UL 0.0       Renal function is intact, she has dyslipidemia:    Results for Heather Sosa, Heather Sosa (MRN [id number]) as of 08/09/2016 11:51   Ref. Range 03/21/2016 21:35 06/14/2016 15:08   Sodium Latest Ref Range: 136 - 145 mmol/L 137    Potassium Latest Ref Range: 3.5 - 5.1 mmol/L 4.0     Chloride Latest Ref Range: 97 - 108 mmol/L 101    CO2 Latest Ref Range: 21 - 32 mmol/L 27    Anion gap Latest Ref Range: 5 - 15 mmol/L 9    Glucose Latest Ref Range: 65 - 100 mg/dL 79    BUN Latest Ref Range: 6 - 20 MG/DL 14    Creatinine Latest Ref Range: 0.55 - 1.02 MG/DL 2.95    BUN/Creatinine ratio Latest Ref Range: 12 - 20   15    Calcium Latest Ref Range: 8.5 - 10.1 MG/DL 9.1    GFR est non-AA Latest Ref Range: >60 ml/min/1.51m2 >60    GFR est AA Latest Ref Range: >60 ml/min/1.91m2 >60    Triglyceride Latest Ref Range: 0 - 149 mg/dL  621   Cholesterol, total Latest Ref Range: 100 - 199 mg/dL  308 (H)   HDL Cholesterol Latest Ref Range: >39 mg/dL  55   LDL, calculated Latest Ref Range: 0 - 99 mg/dL  657 (H)   VLDL, calculated Latest Ref Range: 5 - 40 mg/dL  23   Troponin-I, Qt.  Latest Ref Range: <0.05 ng/mL <0.04        On exam, blood pressure is 146/95.  Resting pulse is 85 and regular.  Weight is 282 pounds.  At a height of 5 feet this yields a body mass index of 45.52.  Neck is relatively short for body weight.  Jugular veins are flat in a seated position.  Carotids are silent.  Lungs are clear to auscultation.  Peripheral pulses are intact.  There are no visible skin or nailbed changes.  There is no significant ciliary body injection on external eye exam.  Fundi could not be visualized.  EOMs are intact.  There is no axillary or cervical lymphadenopathy.  Cardiac auscultation shows regular rhythm with normal quality S1 and S2, no murmur, no gallop.  There is trivial ankle edema with no sign of DVT.  Abdomen is negative for pulsation, bruit, mass, and tenderness.    Twelve-lead EKG shows sinus rhythm with leftward axis and delayed precordial R-wave progression along with widening of the terminal portion of the QRS visible in the precordial leads.  There are no been R waves in V1 V2 and V3.    Impression: Pulmonary sarcoidosis    Subtle EKG abnormalities, we should exclude myocardial sarcoid     No sign of conduction system disease that could be related to sarcoid    Obesity    Intact renal function    Moderate hypertension on current meds    Plan:  Echocardiography with contrast for full examination of the ventricular myocardium, septum, and valvular structures    No change in treatment at this time, next contact after the echo is completed

## 2016-08-09 NOTE — Progress Notes (Signed)
Chief Complaint   Patient presents with   ??? New Patient     pt c/o wheezing.     1. Have you been to the ER, urgent care clinic since your last visit?  Hospitalized since your last visit?Yes When: 03/2016, chest pain , [facility]    2. Have you seen or consulted any other health care providers outside of the [facility] since your last visit?  Include any pap smears or colon screening. Yes When: [facility], 07[facility]16- MD.IDEN for Sarcoidosis,

## 2016-08-16 ENCOUNTER — Ambulatory Visit: Payer: MEDICAID | Primary: Family

## 2016-08-23 ENCOUNTER — Inpatient Hospital Stay: Admit: 2016-08-23 | Payer: MEDICAID | Attending: Cardiovascular Disease | Primary: Family

## 2016-08-23 DIAGNOSIS — D869 Sarcoidosis, unspecified: Secondary | ICD-10-CM

## 2016-08-23 MED ORDER — SODIUM CHLORIDE 0.9 % IJ SYRG
INTRAMUSCULAR | Status: AC
Start: 2016-08-23 — End: 2016-08-24

## 2016-08-23 MED ORDER — PERFLUTREN LIPID MICROSPHERES 1.1 MG/ML IV
1.1 mg/mL | Freq: Once | INTRAVENOUS | Status: AC
Start: 2016-08-23 — End: 2016-08-23
  Administered 2016-08-23: 20:00:00 via INTRAVENOUS

## 2016-08-23 MED ORDER — PERFLUTREN LIPID MICROSPHERES 1.1 MG/ML IV
1.1 mg/mL | INTRAVENOUS | Status: AC
Start: 2016-08-23 — End: 2016-08-24

## 2016-08-23 MED ORDER — SALINE PERIPHERAL FLUSH PRN
INTRAMUSCULAR | Status: DC | PRN
Start: 2016-08-23 — End: 2016-08-27
  Administered 2016-08-23 (×3)

## 2016-08-23 MED FILL — DEFINITY 1.1 MG/ML INTRAVENOUS SUSPENSION: 1.1 mg/mL | INTRAVENOUS | Qty: 2

## 2016-08-23 MED FILL — BD POSIFLUSH NORMAL SALINE 0.9 % INJECTION SYRINGE: INTRAMUSCULAR | Qty: 30

## 2016-08-23 NOTE — Progress Notes (Signed)
Echocardiogram 2D adult completed as ordered.  Procedure explained.  Full report to follow.  Definity given intravenously for image enhancement.  Agitated Saline given intravenously for image enhancement.

## 2016-08-23 NOTE — Progress Notes (Signed)
Dr Tresa EndoKelly in room to view images.iv removed tip intact, dsg placed.

## 2016-08-23 NOTE — Progress Notes (Addendum)
Pt arrived for test, order noted for definity and iv per Dr Tresa EndoKelly.iv started #24 right ac.

## 2016-08-23 NOTE — Progress Notes (Signed)
Call placed to Dr Tresa EndoKelly, awaiting response.1ml iv definity given total in right ac iv site.

## 2016-08-26 ENCOUNTER — Encounter

## 2016-08-26 LAB — ECHOCARDIOGRAM COMPLETE 2D W DOPPLER W COLOR: Left Ventricular Ejection Fraction: 50

## 2016-08-26 NOTE — Progress Notes (Signed)
Echocardiography with Definity contrast and 3D imaging shows a transmural myocardial cleft at the apical-septal wall with adjacent thickening and mild hypokinesis in the LV apex. This may represent a complex sarcoid lesion. 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  The patient was started on ASA 81 mg daily. We will probably obtain cardiac MRI next. She may need evaluation for ICD.     Cardia MRI ordered and will contact patient to inform and explain

## 2016-08-27 NOTE — Telephone Encounter (Signed)
Spoke with pt . Verified 2 identifers.  [facility]  , she can have an appointment to discuss and this request is to clarify what was on her echo exam and also asked pt is she taking asprin 81mg  daily . Pt stated she has been taking asprin 81mg  daily since 08/23/16 and will rather have Dr. Tresa EndoKelly call her directly before she makes another appointment because of her transportation problems. Patient verbalize understanding .  Told pt I would forward her request of callback [facility] to advise best contact for pt :[phone]

## 2016-08-28 NOTE — Progress Notes (Signed)
I returned Ms. Heather Sosa' c to answer further questions about her current condition and about the MRI.   she has been scheduled to have it done at [facility].  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  It is contractile but the endocardium is not smooth and it has a different echogenicity about it.  I reviewed [place] echocardi[place].  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  The current appearance does not suggest apical aneurysm.  Apparently her cardiologist in [place] also contradicted the echo finding and from what she is passing on the description has not significantly changed in the last year.    If she has evidence on MRI without and then with contrast of further sarcoid involvement at the apex, it will be necessary to consider electrophysiology consultation and also to keep a close eye on the area in case there is a possibility that surgical intervention will be indicated in the future.  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  Her mother has a different set of manifestations with principal involvement being the cardiac conduction system.  This patient's conduction system is fully intact at this time and there is no evidence of fibrotic or granulomatous change on echo.    Next contact will be after the MRI is completed.  Other than the addition of baby aspirin, I made no other adjustments in her therapy today.

## 2016-09-27 ENCOUNTER — Encounter: Attending: Family | Primary: Family

## 2016-10-02 ENCOUNTER — Inpatient Hospital Stay: Payer: MEDICAID | Attending: Cardiovascular Disease | Primary: Family

## 2016-10-04 ENCOUNTER — Ambulatory Visit: Admit: 2016-10-04 | Discharge: 2016-10-04 | Payer: PRIVATE HEALTH INSURANCE | Attending: Family | Primary: Family

## 2016-10-04 DIAGNOSIS — D869 Sarcoidosis, unspecified: Secondary | ICD-10-CM

## 2016-10-04 MED ORDER — ALBUTEROL SULFATE HFA 90 MCG/ACTUATION AEROSOL INHALER
90 mcg/actuation | RESPIRATORY_TRACT | 1 refills | Status: DC | PRN
Start: 2016-10-04 — End: 2019-12-01

## 2016-10-04 NOTE — Progress Notes (Signed)
Pt here for   Chief Complaint   Patient presents with   ??? Follow-up   ??? Cholesterol Problem   ??? Cold Symptoms     1. Have you been to the ER, urgent care clinic since your last visit?  Hospitalized since your last visit?No    2. Have you seen or consulted any other health care providers outside of the [facility] since your last visit?  Include any pap smears or colon screening. No       Pt denies pain at this time      PHQ over the last two weeks 10/04/2016   Little interest or pleasure in doing things Not at all   Feeling down, depressed or hopeless Not at all   Total Score PHQ 2 0

## 2016-10-04 NOTE — Patient Instructions (Signed)
Starting a Weight Loss Plan: Care Instructions  Your Care Instructions    If you are thinking about losing weight, it can be hard to know where to start. Your doctor can help you set up a weight loss plan that best meets your needs. You may want to take a class on nutrition or exercise, or join a weight loss support group. If you have questions about how to make changes to your eating or exercise habits, ask your doctor about seeing a registered dietitian or an exercise specialist.  It can be a big challenge to lose weight. But you do not have to make huge changes at once. Make small changes, and stick with them. When those changes become habit, add a few more changes.  If you do not think you are ready to make changes right now, try to pick a date in the future. Make an appointment to see your doctor to discuss whether the time is right for you to start a plan.  Follow-up care is a key part of your treatment and safety. Be sure to make and go to all appointments, and call your doctor if you are having problems. It's also a good idea to know your test results and keep a list of the medicines you take.  How can you care for yourself at home?  ?? Set realistic goals. Many people expect to lose much more weight than is likely. A weight loss of 5% to 10% of your body weight may be enough to improve your health.  ?? Get family and friends involved to provide support. Talk to them about why you are trying to lose weight, and ask them to help. They can help by participating in exercise and having meals with you, even if they may be eating something different.  ?? Find what works best for you. If you do not have time or do not like to cook, a program that offers meal replacement bars or shakes may be better for you. Or if you like to prepare meals, finding a plan that includes daily menus and recipes may be best.  ?? Ask your doctor about other health professionals who can help you achieve your weight loss goals.   ?? A dietitian can help you make healthy changes in your diet.  ?? An exercise specialist or personal trainer can help you develop a safe and effective exercise program.  ?? A counselor or psychiatrist can help you cope with issues such as depression, anxiety, or family problems that can make it hard to focus on weight loss.  ?? Consider joining a support group for people who are trying to lose weight. Your doctor can suggest groups in your area.  Where can you learn more?  Go to http://www.healthwise.net/GoodHelpConnections.  Enter U357 in the search box to learn more about "Starting a Weight Loss Plan: Care Instructions."  Current as of: April 14, 2015  Content Version: 11.4  ?? 2006-2017 [employer] Incorporated. Care instructions adapted under license by [employer] (which disclaims liability or warranty for this information). If you have questions about a medical condition or this instruction, always ask your healthcare professional. Healthwise, Incorporated disclaims any warranty or liability for your use of this information.

## 2016-10-04 NOTE — Progress Notes (Signed)
Subjective: (As above and below)     Chief Complaint   Patient presents with   ??? Follow-up   ??? Cholesterol Problem   ??? Cold Symptoms     Heather Sosa is a 51 y.o. year old female who presents for hyperlipidemia and f/u from referrals.    She has est w/ pulm at [facility] for sarcoid hx of sleep apnea (was told cpap not needed per patient). She has an eye exam scheduled soon. She has also seen cardio and an MRI is pending. She reports doing well overall.       BP Readings from Last 3 Encounters:   10/04/16 137/83   08/09/16 (!) 146/95   06/14/16 125/76       Reviewed PmHx, RxHx, FmHx, SocHx, AllgHx and updated in chart.  Family History   Problem Relation Age of Onset   ??? Cancer Maternal Grandmother      unknown   ??? Cancer Paternal Grandmother      breast       Past Medical History:   Diagnosis Date   ??? Costochondral chest pain    ??? High cholesterol    ??? Sarcoid       Social History     Social History   ??? Marital status: DIVORCED     Spouse name: N/A   ??? Number of children: N/A   ??? Years of education: N/A     Social History Main Topics   ??? Smoking status: Never Smoker   ??? Smokeless tobacco: Never Used   ??? Alcohol use No   ??? Drug use: No   ??? Sexual activity: No     Other Topics Concern   ??? None     Social History Narrative          Current Outpatient Prescriptions   Medication Sig   ??? aspirin delayed-release 81 mg tablet Take  by mouth daily.   ??? albuterol (PROAIR HFA) 90 mcg/actuation inhaler Take 1 Puff by inhalation every four (4) hours as needed for Wheezing.     No current facility-administered medications for this visit.        Review of Systems:   Constitutional:    Negative for fever and chills, negative diaphoresis.   HEENT:              Negative for neck pain and stiffness.  Eyes:                  Negative for visual disturbance, itching, redness or discharge.   Respiratory:        Negative for cough and shortness of breath.   Cardiovascular:  Negative for chest pain and palpitations.    Gastrointestinal: Negative for nausea, vomiting, abdominal pain, diarrhea or constipation.  Genitourinary:     Negative for dysuria and frequency.   Musculoskeletal: Negative for falls, tenderness and swelling.  Skin:                    Negative for rash, masses or lesions.   Neurological:       Negative for dizzyness, seizure, loss of consciousness, weakness and numbness.     Objective:     Vitals:    10/04/16 1445 10/04/16 1450 10/04/16 1515   BP: 150/90 156/82 137/83   Pulse: 90 94 82   Resp: 18     Temp: 98 ??F (36.7 ??C)     TempSrc: Oral     Weight: 280 lb 1.6 oz (127.1  kg)     Height:  (1.676 m)         Physical Examination: General appearance - alert, well appearing, and in no distress and overweight  Mental status - alert, oriented to person, place, and time  Chest - clear to auscultation, no wheezes, rales or rhonchi, symmetric air entry  Heart - normal rate, regular rhythm, normal S1, S2, no murmurs, rubs, clicks or gallops  Neurological - alert, oriented, normal speech, no focal findings or movement disorder noted  Extremities - peripheral pulses normal, no pedal edema, no clubbing or cyanosis  Skin - No lower extremity edema       Assessment/ Plan:   Follow-up Disposition:  Return in about 6 months (around 04/05/2017), or pap exam.     Will check recs for pap results, per patient done in 2016.     1. Sarcoid  F/u w/ pulm/optho as planned    2. High cholesterol    - METABOLIC PANEL, BASIC  - LIPID PANEL    3. Blood pressure elevated without history of HTN    - METABOLIC PANEL, BASIC  - TSH 3RD GENERATION        I have discussed the diagnosis with the patient and the intended plan as seen in the above orders.  The patient has received an after-visit summary and questions were answered concerning future plans.  Pt conveyed understanding of plan.      Medication Side Effects and Warnings were discussed with patient: yes  Patient Labs were reviewed: yes  Patient Past Records were reviewed:  yes     Heather Sosa Z. Doreatha Massed, NP

## 2016-10-05 LAB — METABOLIC PANEL, BASIC
BUN/Creatinine ratio: 11 (ref 9–23)
BUN: 10 mg/dL (ref 6–24)
CO2: 26 mmol/L (ref 18–29)
Calcium: 8.8 mg/dL (ref 8.7–10.2)
Chloride: 99 mmol/L (ref 96–106)
Creatinine: 0.89 mg/dL (ref 0.57–1.00)
GFR est AA: 88 mL/min/{1.73_m2} (ref >59)
GFR est non-AA: 76 mL/min/{1.73_m2} (ref >59)
Glucose: 80 mg/dL (ref 65–99)
Potassium: 4 mmol/L (ref 3.5–5.2)
Sodium: 137 mmol/L (ref 134–144)

## 2016-10-05 LAB — LIPID PANEL
Cholesterol, total: 194 mg/dL (ref 100–199)
HDL Cholesterol: 45 mg/dL (ref >39)
LDL, calculated: 133 mg/dL — ABNORMAL HIGH (ref 0–99)
Triglyceride: 79 mg/dL (ref 0–149)
VLDL, calculated: 16 mg/dL (ref 5–40)

## 2016-10-05 LAB — TSH 3RD GENERATION: TSH: 1.52 u[IU]/mL (ref 0.450–4.500)

## 2016-10-05 LAB — CVD REPORT

## 2016-10-11 NOTE — Telephone Encounter (Addendum)
Writer called and left a voicemail for the patient to call back to the office. The ROI has been sent to the OBGYN's office. Just waiting for them to send the office the records    We received the notes

## 2016-11-13 ENCOUNTER — Inpatient Hospital Stay: Admit: 2016-11-13 | Payer: MEDICAID | Attending: Cardiovascular Disease | Primary: Family

## 2016-11-13 DIAGNOSIS — D869 Sarcoidosis, unspecified: Secondary | ICD-10-CM

## 2016-11-13 LAB — MRI CARDIAC W WO CONTRAST: Left Ventricular Ejection Fraction: 52

## 2016-11-13 MED ORDER — GADOPENTETATE DIMEGLUMINE 10 MMOL/20 ML (469.01 MG/ML) IV
10 mmol/20 mL (469.01 mg/mL) | Freq: Once | INTRAVENOUS | Status: AC
Start: 2016-11-13 — End: 2016-11-13
  Administered 2016-11-13: 19:00:00 via INTRAVENOUS

## 2016-11-29 ENCOUNTER — Encounter: Attending: Family | Primary: Family

## 2016-12-03 ENCOUNTER — Encounter: Attending: Family | Primary: Family

## 2016-12-10 NOTE — Telephone Encounter (Signed)
Spoke with pt . Verified 2 identifers.  Told pt I located her CD and gave to front desk sandra watson she may pick up during bussiness hours pt verbalized understanding.

## 2016-12-10 NOTE — Telephone Encounter (Signed)
Spoke with pt . Verified 2 identifers.  Pt states she gave a CD of her MRI to [facility] and would like to know has Dr.Kelly seen it and would like CD back.  Told pt I would forward to provider return call with response.Patient verbalize understanding .

## 2016-12-14 ENCOUNTER — Encounter

## 2016-12-14 NOTE — Telephone Encounter (Signed)
Spoke to patient she states she is having surgery on 12-28-16 and going to need a bedside commode, explain to the patient that writer will need to get a order from the provider and then send that order to the DME company, and they will contact her for the set up of the equipment, advised patient if she has not heard from DME in a few days to call office back . Pt understood instructions Jacqulyn LinerVanessa V Giles LPN

## 2016-12-14 NOTE — Telephone Encounter (Signed)
Patient called and stated that she will be having a procedure done this month and she will need a portable toilet. She is unsure what medical supply company is covered under her insurance. The contact number is [phone].

## 2016-12-21 NOTE — Telephone Encounter (Signed)
Patient called and stated that she has not heard from the DME company yet regarding her order for a bedside commode. The contact number is [phone].

## 2016-12-24 NOTE — Telephone Encounter (Signed)
Faxed Last Office Notes to [employer] per their request. Jacqulyn LinerVanessa V Giles LPN

## 2016-12-27 NOTE — Telephone Encounter (Signed)
Spoke with pt . Verified 2 identifers.  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

## 2016-12-27 NOTE — Progress Notes (Signed)
Since last office encounter, Heather Sosa has undergone contrast echocardiography and then cardiac MRI.  Both studies demonstrate borderline normal LV systolic function with a minimal increase in myocardial mass, but with multiple significant circumscribed transmural defects with thin myocardium and penetration of echo contrast nearly to the epicardium.  The cardiac MRI confirms the fact that these are most likely multiple sarcoid lesions coinciding with substantial paratracheal lymphadenopathy.  The right ventricle is dilated to a moderate extent as well.  The DVD of the MRI will be returned to the patient and the data remains in the system because the study was performed in [facility].    Given the above data, this young lady has clear evidence of sarcoid cardiac involvement and as such she should be considered for electrophysiologic intervention possibly with placement of an ICD.  The patient will be informed that the indication for device therapy is present.

## 2016-12-27 NOTE — Telephone Encounter (Signed)
PT WOULD PLEASE LIKE A RETURN CALL BACK FROM DR. KELLY ASAP CONCERNING HER CD THX.

## 2016-12-27 NOTE — Progress Notes (Signed)
Cardiology:    I had a long discussion with the patient about the findings.  Apparently she is scheduled for ICD placement tomorrow.  She was taken off of aspirin by her pulmonologist because of asthma exacerbation and I explained to her that the configuration of the sarcoid related scarring in the myocardium creates little pockets where stagnation becomes a possibility and that it might be wise for her to be on some form of antiplatelet therapy even if aspirin needs to be excluded.  This could be with clopidogrel or any of the other antiplatelet drugs.  There is no evidence in the absence of event history that there would be any benefit from warfarin or an NVKA drug

## 2017-01-25 ENCOUNTER — Ambulatory Visit: Admit: 2017-01-25 | Discharge: 2017-01-25 | Payer: PRIVATE HEALTH INSURANCE | Attending: Family | Primary: Family

## 2017-01-25 DIAGNOSIS — R21 Rash and other nonspecific skin eruption: Secondary | ICD-10-CM

## 2017-01-25 MED ORDER — HYDROCORTISONE 1 % TOPICAL CREAM
1 % | Freq: Two times a day (BID) | CUTANEOUS | 0 refills | Status: DC
Start: 2017-01-25 — End: 2017-01-25

## 2017-01-25 MED ORDER — LOSARTAN 25 MG TAB
25 mg | ORAL_TABLET | Freq: Every day | ORAL | 1 refills | Status: DC
Start: 2017-01-25 — End: 2017-03-18

## 2017-01-25 MED ORDER — HYDROCORTISONE 1 % TOPICAL CREAM
1 % | Freq: Two times a day (BID) | CUTANEOUS | 0 refills | Status: DC
Start: 2017-01-25 — End: 2020-09-08

## 2017-01-25 NOTE — Progress Notes (Signed)
Subjective: (As above and below)     Chief Complaint   Patient presents with   ??? Elevated Blood Pressure     stop taking lisinopril due to itching and rashes   ??? Allergic Reaction     rashes and itching     Heather Sosa is a 51 y.o. year old female who presents for possible allergic rxn    She recently underwent  ICD placement on 6/29 at [facility] w/ Dr. Aniceto Bosshron. She states that since taking the lisinopril she has broken out in a rash. She states that she started the lisinopril on a Friday and rash began 1-2 days after. Her rash is pruritic, is located on her arms, chest wall and hands. She states that she called her cardiologist and was told to hold the lisinopril - she has not taken it since Monday and the rash has started to clear up.... She denies any mouth/tongue swelling. She reports a dry mouth.     The rash is not spreading. She has not put anything on it...      No prior hx of eczema, she does have dx of sarcoid, no prior cutaneous involvement...    Several possible contact irritants during hospital I.e. Tape, sheets,soaps, however, rash is not limited to areas of tape, nothing on legs, back...    She is worried bc off the lisinopril her blood pressure is starting to increase. She states that she spoke w/ cardio and they told her to f/u next week. She has been checking BP at home and readings have been in the 150's.       Reviewed PmHx, RxHx, FmHx, SocHx, AllgHx and updated in chart.  Family History   Problem Relation Age of Onset   ??? Cancer Maternal Grandmother      unknown   ??? Cancer Paternal Grandmother      breast       Past Medical History:   Diagnosis Date   ??? Cardiac defibrillator in place 2018   ??? Costochondral chest pain    ??? High cholesterol    ??? Sarcoid       Social History     Social History   ??? Marital status: DIVORCED     Spouse name: N/A   ??? Number of children: N/A   ??? Years of education: N/A     Social History Main Topics   ??? Smoking status: Never Smoker   ??? Smokeless tobacco: Never Used    ??? Alcohol use No   ??? Drug use: No   ??? Sexual activity: No     Other Topics Concern   ??? None     Social History Narrative          Current Outpatient Prescriptions   Medication Sig   ??? SYMBICORT 80-4.5 mcg/actuation HFAA    ??? losartan (COZAAR) 25 mg tablet Take 1 Tab by mouth daily.   ??? hydrocortisone (CORTAID) 1 % topical cream Apply  to affected area two (2) times a day. use thin layer   ??? albuterol (PROAIR HFA) 90 mcg/actuation inhaler Take 1 Puff by inhalation every four (4) hours as needed for Wheezing.   ??? omeprazole (PRILOSEC) 20 mg capsule    ??? predniSONE (DELTASONE) 10 mg tablet    ??? traMADol (ULTRAM) 50 mg tablet TAKE ONE TABLET BY MOUTH EVERY 4 HOURS AS NEEDED FOR MODERATE PAIN   ??? aspirin delayed-release 81 mg tablet Take  by mouth daily.     No current facility-administered  medications for this visit.        Review of Systems:   Constitutional:    Negative for fever and chills, negative diaphoresis.   HEENT:              Negative for neck pain and stiffness.  Eyes:                  Negative for visual disturbance, itching, redness or discharge.   Respiratory:        Negative for cough and shortness of breath.   Cardiovascular:  Negative for chest pain and palpitations. +chest wall pain s/p ICD  Gastrointestinal: Negative for nausea, vomiting, abdominal pain, diarrhea or constipation.  Genitourinary:     Negative for dysuria and frequency.   Musculoskeletal: Negative for falls, tenderness and swelling.  Skin:                    +rash  Neurological:       Negative for dizzyness, seizure, loss of consciousness, weakness and numbness.     Objective:     Vitals:    01/25/17 1328   BP: 159/85   Pulse: 96   Resp: 22   Temp: 97.1 ??F (36.2 ??C)   TempSrc: Oral   SpO2: 94%   Weight: 276 lb 1.6 oz (125.2 kg)   Height: 5\' 6"  (1.676 m)         Physical Examination: General appearance - alert, well appearing, and in no distress and overweight  Mouth - mucous membranes moist, pharynx normal without lesions   Chest - clear to auscultation, no wheezes, rales or rhonchi, symmetric air entry  Heart - normal rate, regular rhythm, normal S1, S2, no murmurs, rubs, clicks or gallops  Skin - rash is present to left inner arm, right breast, both hands and anterior chest wall. It is non-erythematous, flat flaky patches.no raised or discreet borders - not typical of sarcoid lesions...    Assessment/ Plan:   Follow-up Disposition:  Return if symptoms worsen or fail to improve.     Discussed w/ nurse Bonita QuinLinda for Dr. [facility] - will switch to losartan as BP is up.     1. Rash    - hydrocortisone (CORTAID) 1 % topical cream; Apply  to affected area two (2) times a day. use thin layer  Dispense: 30 g; Refill: 0    2. Essential hypertension  F/u w/ cardio as planned, if any mouth irritation,facial swelling = ED  - losartan (COZAAR) 25 mg tablet; Take 1 Tab by mouth daily.  Dispense: 30 Tab; Refill: 1        I have discussed the diagnosis with the patient and the intended plan as seen in the above orders.  The patient has received an after-visit summary and questions were answered concerning future plans.  Pt conveyed understanding of plan.      Medication Side Effects and Warnings were discussed with patient: yes  Patient Labs were reviewed: yes  Patient Past Records were reviewed:  yes    Heather Manges Z. Doreatha MassedNakoneczny, NP

## 2017-01-25 NOTE — Progress Notes (Signed)
Ms. Heather Sosa has switched [facility] care to [facility] for economic reasons. She has an ICD in situ now in her left shoulder. She is wearing a sling for comfort. She states all of her cardiac ambulatory care will be in [facility]

## 2017-01-25 NOTE — Progress Notes (Signed)
Pt here for   Chief Complaint   Patient presents with   ??? Elevated Blood Pressure     stop taking lisinopril due to itching and rashes   ??? Allergic Reaction     rashes and itching     1. Have you been to the ER, urgent care clinic since your last visit?  Hospitalized since your last visit?No    2. Have you seen or consulted any other health care providers outside of the [facility] since your last visit?  Include any pap smears or colon screening. No       Pt c/o pain 4 of 10, Pt denies taking anything for pain today          PHQ over the last two weeks 01/25/2017   Little interest or pleasure in doing things Not at all   Feeling down, depressed, irritable, or hopeless Not at all   Total Score PHQ 2 0

## 2017-01-25 NOTE — Patient Instructions (Signed)
Rash: Care Instructions  Your Care Instructions  A rash is any irritation or inflammation of the skin. Rashes have many possible causes, including allergy, infection, illness, heat, and emotional stress.  Follow-up care is a key part of your treatment and safety. Be sure to make and go to all appointments, and call your doctor if you are having problems. It's also a good idea to know your test results and keep a list of the medicines you take.  How can you care for yourself at home?  ?? Wash the area with water only. Soap can make dryness and itching worse. Pat dry.  ?? Put cold, wet cloths on the rash to reduce itching.  ?? Keep cool, and stay out of the sun.  ?? Leave the rash open to the air as much of the time as possible.  ?? Sometimes petroleum jelly (Vaseline) can help relieve the discomfort caused by a rash. A moisturizing lotion, such as Cetaphil, also may help. Calamine lotion may help for rashes caused by contact with something (such as a plant or soap) that irritated the skin. Use it 3 or 4 times a day.  ?? If your doctor prescribed a cream, use it as directed. If your doctor prescribed medicine, take it exactly as directed.  ?? If your rash itches so badly that it interferes with your normal activities, take an over-the-counter antihistamine, such as diphenhydramine (Benadryl) or loratadine (Claritin). Read and follow all instructions on the label.  When should you call for help?  Call your doctor now or seek immediate medical care if:  ?? ?? You have signs of infection, such as:  ?? Increased pain, swelling, warmth, or redness.  ?? Red streaks leading from the area.  ?? Pus draining from the area.  ?? A fever.   ?? ?? You have joint pain along with the rash.   ??Watch closely for changes in your health, and be sure to contact your doctor if:  ?? ?? Your rash is changing or getting worse. For example, call if you have pain along with the rash, the rash is spreading, or you have new blisters.    ?? ?? You do not get better after 1 week.   Where can you learn more?  Go to http://www.healthwise.net/GoodHelpConnections.  Enter U711 in the search box to learn more about "Rash: Care Instructions."  Current as of: April 05, 2016  Content Version: 11.7  ?? 2006-2018 Healthwise, Incorporated. Care instructions adapted under license by [employer] (which disclaims liability or warranty for this information). If you have questions about a medical condition or this instruction, always ask your healthcare professional. Healthwise, Incorporated disclaims any warranty or liability for your use of this information.

## 2017-03-18 ENCOUNTER — Encounter

## 2017-03-18 MED ORDER — LOSARTAN 25 MG TAB
25 mg | ORAL_TABLET | Freq: Every day | ORAL | 0 refills | Status: DC
Start: 2017-03-18 — End: 2017-06-17

## 2017-03-18 NOTE — Telephone Encounter (Signed)
rc'd refill request from patient for losartan - at last visit lisinopril was causing rash - losartan has not, she also has set up care w/ cardiology at [facility] and no med changes were made  Will send refill

## 2017-06-17 ENCOUNTER — Encounter

## 2017-06-17 MED ORDER — LOSARTAN 25 MG TAB
25 mg | ORAL_TABLET | Freq: Every day | ORAL | 0 refills | Status: DC
Start: 2017-06-17 — End: 2017-09-19

## 2017-08-15 ENCOUNTER — Encounter

## 2017-08-15 NOTE — Telephone Encounter (Signed)
Patient stated that she would like to have her mammogram done. She is due for a screening mammogram. The contact number is [phone].

## 2017-08-22 ENCOUNTER — Encounter: Attending: Family | Primary: Family

## 2017-08-29 ENCOUNTER — Inpatient Hospital Stay: Admit: 2017-08-29 | Payer: MEDICAID | Attending: Family | Primary: Family

## 2017-08-29 ENCOUNTER — Ambulatory Visit: Admit: 2017-08-29 | Payer: PRIVATE HEALTH INSURANCE | Attending: Family | Primary: Family

## 2017-08-29 DIAGNOSIS — I10 Essential (primary) hypertension: Secondary | ICD-10-CM

## 2017-08-29 DIAGNOSIS — Z1231 Encounter for screening mammogram for malignant neoplasm of breast: Secondary | ICD-10-CM

## 2017-08-29 MED ORDER — VARICELLA-ZOSTER GLYCOE VACC-AS01B ADJ(PF) 50 MCG/0.5 ML IM SUSPENSION
50 mcg/0.5 mL | Freq: Once | INTRAMUSCULAR | 1 refills | Status: AC
Start: 2017-08-29 — End: 2017-08-29

## 2017-08-29 NOTE — Progress Notes (Signed)
Subjective: (As above and below)     Chief Complaint   Patient presents with   ??? Shoulder Pain     Right    ??? Menstrual Problem     HAs not has a period since last MArch     Heather Sosa is a 52 y.o. year old female who presents for HTN    Hypertension ROS:  taking medications as instructed, no medication side effects noted, no TIAs, no chest pain on exertion, no dyspnea on exertion, no swelling of ankles    Sarcoid: [facility],    ICD : [facility]. 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 She has discussed this w/ her cardiologist and states she is not going to take this medication because she feels the risk of clot is low....    Missed menses: last menses 08/2016. Not sexually active. No pelvic pain. She does complain of "dark urine" no dysuria/frequency    Colonoscopy: not done yet    Right shoulder pain: chronic. No inciting injury. No neuropathy. Aching in nature. Relief w/ nsaids. No hand weaknes    Current on eye exam: thru [facility]    Wt Readings from Last 3 Encounters:   08/29/17 303 lb 4.8 oz (137.6 kg)   01/25/17 276 lb 1.6 oz (125.2 kg)   10/04/16 280 lb 1.6 oz (127.1 kg)         Reviewed PmHx, RxHx, FmHx, SocHx, AllgHx and updated in chart.  Family History   Problem Relation Age of Onset   ??? Cancer Maternal Grandmother         unknown   ??? Cancer Paternal Grandmother         breast       Past Medical History:   Diagnosis Date   ??? Cardiac defibrillator in place 2018   ??? Costochondral chest pain    ??? High cholesterol    ??? Sarcoid       Social History     Socioeconomic History   ??? Marital status: DIVORCED     Spouse name: Not on file   ??? Number of children: Not on file   ??? Years of education: Not on file   ??? Highest education level: Not on file   Tobacco Use   ??? Smoking status: Never Smoker   ??? Smokeless tobacco: Never Used   Substance and Sexual Activity    ??? Alcohol use: No   ??? Drug use: No   ??? Sexual activity: No          Current Outpatient Medications   Medication Sig   ??? dorzolamide (TRUSOPT) 2 % ophthalmic solution    ??? folic acid (FOLVITE) 1 mg tablet    ??? methotrexate (RHEUMATREX) 2.5 mg tablet    ??? losartan (COZAAR) 25 mg tablet Take 1 Tab by mouth daily.   ??? SYMBICORT 80-4.5 mcg/actuation HFAA    ??? hydrocortisone (CORTAID) 1 % topical cream Apply  to affected area two (2) times a day. use thin layer   ??? albuterol (PROAIR HFA) 90 mcg/actuation inhaler Take 1 Puff by inhalation every four (4) hours as needed for Wheezing.     No current facility-administered medications for this visit.        Review of Systems:   Constitutional:    Negative for fever and chills, negative diaphoresis.   HEENT:  Negative for neck pain and stiffness.  Eyes:                  Negative for visual disturbance, itching, redness or discharge.   Respiratory:        Negative for cough and shortness of breath.   Cardiovascular:  Negative for chest pain and palpitations.   Gastrointestinal: Negative for nausea, vomiting, abdominal pain, diarrhea or constipation.  Genitourinary:     Negative for dysuria and frequency.   Musculoskeletal: right shoulder pain  Skin:                    Negative for rash, masses or lesions.   Neurological:       Negative for dizzyness, seizure, loss of consciousness, weakness and numbness.     Objective:     Vitals:    08/29/17 1535   BP: 132/83   Pulse: 87   Resp: 20   Temp: 97.6 ??F (36.4 ??C)   TempSrc: Oral   SpO2: 97%   Weight: 303 lb 4.8 oz (137.6 kg)   Height: 5\' 6"  (1.676 m)         Physical Examination: General appearance - alert, well appearing, and in no distress  Chest - clear to auscultation, no wheezes, rales or rhonchi, symmetric air entry  Heart - normal rate, regular rhythm, normal S1, S2, no murmurs, rubs, clicks or gallops  Musculoskeletal - mild ttp to anterior and posterior shoulder. Full ROM  w/o pain. Neg drop arm test. Grip strength 5/5 bilat  Extremities - no pedal edema noted      Assessment/ Plan:   Follow-up Disposition:  Return in about 6 months (around 02/26/2018), or if symptoms worsen or fail to improve.     [facility].     1. Essential hypertension  controlled    2. ICD (implantable cardioverter-defibrillator) in place  Managed by cardio at [facility]    3. BMI 45.0-49.9, adult (HCC)      4. Encounter for screening colonoscopy  Will send order to [facility]    5. Dark urine    - AMB POC URINALYSIS DIP STICK AUTO W/O MICRO  - CULTURE, URINE    6. Chronic right shoulder pain  Likely arthritis. Can take otc nsaids as she has been doing  - XR SHOULDER RT AP/LAT MIN 2 V; Future  - REFERRAL TO PHYSICAL THERAPY        I have discussed the diagnosis with the patient and the intended plan as seen in the above orders.  The patient has received an after-visit summary and questions were answered concerning future plans.  Pt conveyed understanding of plan.      Medication Side Effects and Warnings were discussed with patient: yes  Patient Labs were reviewed: yes  Patient Past Records were reviewed:  yes    Corvette Orser Z. Doreatha Massed, NP

## 2017-08-29 NOTE — Progress Notes (Signed)
Pt here for   Chief Complaint   Patient presents with   ??? Shoulder Pain     Right    ??? Menstrual Problem     HAs not has a period since last MArch     1. Have you been to the ER, urgent care clinic since your last visit?  Hospitalized since your last visit?No    2. Have you seen or consulted any other health care providers outside of the [facility] since your last visit?  Include any pap smears or colon screening. No           Pt denies pain at this time        3 most recent PHQ Screens 08/29/2017   Little interest or pleasure in doing things Not at all   Feeling down, depressed, irritable, or hopeless Not at all   Total Score PHQ 2 0

## 2017-08-29 NOTE — Patient Instructions (Addendum)
Recombinant Zoster (Shingles) Vaccine, RZV: What you need to know  Why get vaccinated?  Shingles (also called herpes zoster, or just zoster) is a painful skin rash, often with blisters. Shingles is caused by the varicella zoster virus, the same virus that causes chickenpox. After you have chickenpox, the virus stays in your body and can cause shingles later in life.  You can't catch shingles from another person. However, a person who has never had chickenpox (or chickenpox vaccine) could get chickenpox from someone with shingles.  A shingles rash usually appears on one side of the face or body and heals within 2 to 4 weeks. Its main symptom is pain, which can be severe. Other symptoms can include fever, headache, chills, and upset stomach. Very rarely, a shingles infection can lead to pneumonia, hearing problems, blindness, brain inflammation (encephalitis), or death.  For about 1 person in 5, severe pain can continue even long after the rash has cleared up. This long-lasting pain is called post-herpetic neuralgia (PHN).  Shingles is far more common in people 50 years of age and older than in younger people, and the risk increases with age. It is also more common in people whose immune system is weakened because of a disease such as cancer, or by drugs such as steroids or chemotherapy. At least 1 million people a year in the [place] get shingles.  Shingles vaccine (recombinant)  Recombinant shingles vaccine was approved by FDA in 2017 for the prevention of shingles. In clinical trials, it was more than 90% effective in preventing shingles. It can also reduce the likelihood of PHN.  Two doses, 2 to 6 months apart, are recommended for adults 50 and older.  This vaccine is also recommended for people who have already gotten the live shingles vaccine (Zostavax). There is no live virus in this vaccine.  Some people should not get this vaccine  Tell your vaccine provider if you:   ?? Have any severe, life-threatening allergies. A person who has ever had a life-threatening allergic reaction after a dose of recombinant shingles vaccine, or has a severe allergy to any component of this vaccine, may be advised not to be vaccinated. Ask your health care provider if you want information about vaccine components.  ?? Are pregnant or breastfeeding. There is not much information about use of recombinant shingles vaccine in pregnant or nursing women. Your healthcare provider might recommend delaying vaccination.  ?? Are not feeling well. If you have a mild illness, such as a cold, you can probably get the vaccine today. If you are moderately or severely ill, you should probably wait until you recover. Your doctor can advise you.  Risks of a vaccine reaction  With any medicine, including vaccines, there is a chance of reactions.  After recombinant shingles vaccination, a person might experience:  ?? Pain, redness, soreness, or swelling at the site of the injection  ?? Headache, muscle aches, fever, shivering, fatigue  In clinical trials, most people got a sore arm with mild or moderate pain after vaccination, and some also had redness and swelling where they got the shot. Some people felt tired, had muscle pain, a headache, shivering, fever, stomach pain, or nausea. About 1 out of 6 people who got recombinant zoster vaccine experienced side effects that prevented them from doing regular activities. Symptoms went away on their own in about 2 to 3 days. Side effects were more common in younger people.  You should still get the second dose of recombinant zoster   vaccine even if you had one of these reactions after the first dose.  Other things that could happen after this vaccine:  ?? People sometimes faint after medical procedures, including vaccination. Sitting or lying down for about 15 minutes can help prevent fainting and injuries caused by a fall. Tell your provider if you feel dizzy or have  vision changes or ringing in the ears.  ?? Some people get shoulder pain that can be more severe and longer-lasting than routine soreness that can follow injections. This happens very rarely.  ?? Any medication can cause a severe allergic reaction. Such reactions to a vaccine are estimated at about 1 in a million doses, and would happen within a few minutes to a few hours after the vaccination.  As with any medicine, there is a very remote chance of a vaccine causing a serious injury or death.  The safety of vaccines is always being monitored. For more information, visit: www.cdc.gov/vaccinesafety/  What if there is a serious problem?  What should I look for?  ?? Look for anything that concerns you, such as signs of a severe allergic reaction, very high fever, or unusual behavior.  Signs of a severe allergic reaction can include hives, swelling of the face and throat, difficulty breathing, a fast heartbeat, dizziness, and weakness. These would usually start a few minutes to a few hours after the vaccination.  What should I do?  ?? If you think it is a severe allergic reaction or other emergency that can't wait, call 9-1-1 or get to the nearest hospital. Otherwise, call your health care provider.  ?? Afterward, the reaction should be reported to the Vaccine Adverse Event Reporting System (VAERS). Your doctor should file this report, or you can do it yourself through the VAERS website at www.vaers.hhs.gov, or by calling 1-800-822-7967.  VAERS does not give medical advice..  How can I learn more?  ?? Ask your health care provider. He or she can give you the vaccine package insert or suggest other sources of information.  ?? Call your local or state health department.  ?? Contact the Centers for Disease Control and Prevention (CDC):  ? Call 1-800-232-4636 (1-800-CDC-INFO) or  ? Visit CDC's website at www.cdc.gov/vaccines  Vaccine Information Statement (Interim)  Recombinant Zoster Vaccine  08/13/2016   Department of Health and Human Services  Centers for Disease Control and Prevention  Many Vaccine Information Statements are available in Spanish and other languages. See www.immunize.org/vis.  Hojas de Informaci??n Sobre Vacunas est??n disponibles en Espa??ol y en muchos otros idiomas. Visite http://www.immunize.org/vis   Care instructions adapted under license by [employer] (which disclaims liability or warranty for this information). If you have questions about a medical condition or this instruction, always ask your healthcare professional. [employer]orporated disclaims any warranty or liability for your use of this information.

## 2017-08-31 LAB — CULTURE, URINE
Urine Culture, Routine: NO GROWTH
Urine Culture, Routine: NO GROWTH

## 2017-09-19 ENCOUNTER — Encounter

## 2017-09-19 MED ORDER — LOSARTAN 25 MG TAB
25 mg | ORAL_TABLET | Freq: Every day | ORAL | 0 refills | Status: DC
Start: 2017-09-19 — End: 2018-05-02

## 2017-10-29 NOTE — Telephone Encounter (Signed)
10/29/17 called patient @ [phone] no answer Left message for patient to return call @ [phone] Jacqulyn Liner LPN

## 2017-10-29 NOTE — Telephone Encounter (Signed)
Patient wants to speak with a nurse about some issues that she is having, didn't want to tell me what the problem was, just she need to get a x-ray done

## 2018-02-11 ENCOUNTER — Encounter: Attending: Family | Primary: Family

## 2018-02-17 NOTE — Progress Notes (Signed)
Sarcoid, methotrexate, folic acid, ICD in situ, cared for in [facility]. Her son came in with atypical chest pain at age 51. We will need to exclude sarcoid in his case based on family history

## 2018-02-17 NOTE — Progress Notes (Signed)
Sarcoid, methotrexate, folic acid, ICD in situ, cared for in [facility]. Her son came in with atypical chest pain at age 51. We will need to exclude sarcoid in his case based on family history

## 2018-02-28 ENCOUNTER — Encounter: Attending: Family | Primary: Family

## 2018-03-06 ENCOUNTER — Encounter: Attending: Family | Primary: Family

## 2018-05-02 ENCOUNTER — Ambulatory Visit: Admit: 2018-05-02 | Discharge: 2018-05-02 | Payer: PRIVATE HEALTH INSURANCE | Attending: Family | Primary: Family

## 2018-05-02 ENCOUNTER — Ambulatory Visit: Attending: Family | Primary: Family

## 2018-05-02 DIAGNOSIS — I10 Essential (primary) hypertension: Secondary | ICD-10-CM

## 2018-05-02 MED ORDER — LOSARTAN 25 MG TAB
25 mg | ORAL_TABLET | Freq: Every day | ORAL | 1 refills | Status: AC
Start: 2018-05-02 — End: ?

## 2018-05-02 NOTE — Progress Notes (Signed)
 Pt here for   Chief Complaint   Patient presents with   . Follow-up     6 month recheck     1. Have you been to the ER, urgent care clinic since your last visit?  Hospitalized since your last visit?No    2. Have you seen or consulted any other health care providers outside of the [facility] since your last visit?  Include any pap smears or colon screening. No       Pt denies pain at this time    3 most recent PHQ Screens 05/02/2018   Little interest or pleasure in doing things Not at all   Feeling down, depressed, irritable, or hopeless Not at all   Total Score PHQ 2 0

## 2018-05-02 NOTE — Progress Notes (Signed)
Subjective: (As above and below)     Chief Complaint   Patient presents with   ??? Follow-up     6 month recheck     Heather Sosa is a 52 y.o. year old female who presents for     Hypertension ROS:  taking medications as instructed, no medication side effects noted, no TIAs, no chest pain on exertion, no dyspnea on exertion, no swelling of ankles  ??  Sarcoid: followed by sarcoid clinic at [facility],  ??  ICD : followed by cardiology at [facility]. 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 She has been taking the eliquis    ??  Colonoscopy: not done yet. No family hx of colon ca, no GI problems.    Wt Readings from Last 3 Encounters:   05/02/18 289 lb (131.1 kg)   08/29/17 303 lb 4.8 oz (137.6 kg)   01/25/17 276 lb 1.6 oz (125.2 kg)         Reviewed PmHx, RxHx, FmHx, SocHx, AllgHx and updated in chart.  Family History   Problem Relation Age of Onset   ??? Cancer Maternal Grandmother         unknown   ??? Cancer Paternal Grandmother         breast       Past Medical History:   Diagnosis Date   ??? Cardiac defibrillator in place 2018   ??? Costochondral chest pain    ??? High cholesterol    ??? Sarcoid       Social History     Socioeconomic History   ??? Marital status: DIVORCED     Spouse name: Not on file   ??? Number of children: Not on file   ??? Years of education: Not on file   ??? Highest education level: Not on file   Tobacco Use   ??? Smoking status: Never Smoker   ??? Smokeless tobacco: Never Used   Substance and Sexual Activity   ??? Alcohol use: No   ??? Drug use: No   ??? Sexual activity: Never          Current Outpatient Medications   Medication Sig   ??? ELIQUIS 5 mg tablet    ??? losartan (COZAAR) 25 mg tablet Take 1 Tab by mouth daily.   ??? dorzolamide (TRUSOPT) 2 % ophthalmic solution    ??? folic acid (FOLVITE) 1 mg tablet    ??? methotrexate (RHEUMATREX) 2.5 mg tablet    ??? SYMBICORT 80-4.5 mcg/actuation HFAA    ??? hydrocortisone (CORTAID) 1 % topical  cream Apply  to affected area two (2) times a day. use thin layer   ??? albuterol (PROAIR HFA) 90 mcg/actuation inhaler Take 1 Puff by inhalation every four (4) hours as needed for Wheezing.     No current facility-administered medications for this visit.        Review of Systems:   Constitutional:    Negative for fever and chills, negative diaphoresis.   HEENT:              Negative for neck pain and stiffness.  Eyes:                  Negative for visual disturbance, itching, redness or discharge.   Respiratory:        Negative for cough and shortness of breath.   Cardiovascular:  Negative for chest pain and palpitations.  Gastrointestinal: Negative for nausea, vomiting, abdominal pain, diarrhea or constipation.  Genitourinary:     Negative for dysuria and frequency.   Musculoskeletal: Negative for falls, tenderness and swelling.  Skin:                    Negative for rash, masses or lesions.   Neurological:       Negative for dizzyness, seizure, loss of consciousness, weakness and numbness.     Objective:     Vitals:    05/02/18 1328   BP: 115/74   Pulse: 79   Resp: 18   Temp: 98.9 ??F (37.2 ??C)   TempSrc: Oral   SpO2: 97%   Weight: 289 lb (131.1 kg)   Height: 5\' 6"  (1.676 m)       Results for orders placed or performed in visit on 08/29/17   CULTURE, URINE   Result Value Ref Range    Urine Culture, Routine No growth        Gen: Oriented to person, place and time and well-developed, well-nourished and in no distress.   HEENT:    Head: normocephalic and atraumatic.    Eyes:  EOM are normal. Pupils equal and round.    Neck:  Normal range of motion.  Neck supple.    Cardiovascular: normal rate, regular rhythm and normal heart sounds.   Pulmonary/Chest:  Effort normal and breath sounds normal.  No respiratory distress.  No wheezes, no rales.   Abdominal: soft, normal  bowel sounds.   Musculoskeletal:  No edema, no tenderness.  No calf tenderness or edema.   Neurological:  Alert, oriented to person, place and time.     Skin: skin is warm and dry.         Assessment/ Plan:     Follow-up and Dispositions    ?? Return in about 6 months (around 10/31/2018), or if symptoms worsen or fail to improve.         1. Essential hypertension  good  - METABOLIC PANEL, COMPREHENSIVE  - CBC W/O DIFF  - LIPID PANEL    2. ICD (implantable cardioverter-defibrillator) in place  Followed by cardio    3. Sarcoid  followed by pulm    4. Colon cancer screening  Pt aware that if + will refer to colonoscopy  - COLOGUARD TEST (FECAL DNA COLORECTAL CANCER SCREENING)    5. Mixed hyperlipidemia        I have discussed the diagnosis with the patient and the intended plan as seen in the above orders.  The patient has received an after-visit summary and questions were answered concerning future plans.  Pt conveyed understanding of plan.      Medication Side Effects and Warnings were discussed with patient: yes  Patient Labs were reviewed: yes  Patient Past Records were reviewed:  yes    Demetrion Wesby Z. Doreatha Massed, NP

## 2018-05-02 NOTE — Patient Instructions (Signed)
Colon Cancer Screening: Care Instructions  Your Care Instructions    Colorectal cancer occurs in the colon or rectum. That's the lower part of your digestive system. It is the second-leading cause of cancer deaths in the [place]. It often starts with small growths called polyps in the colon or rectum. Polyps are usually found with screening tests. Depending on the type of test, any polyps found may be removed during the tests.  Colorectal cancer usually does not cause symptoms at first. But regular tests can help find it early, before it spreads and becomes harder to treat.  Your risk for colorectal cancer gets higher as you get older. Some experts say that adults should start regular screening at age 50 and stop at age 75. Others say to start before age 50 or continue after age 75. Talk with your doctor about your risk and when to start and stop screening. You may have one of several tests.  Follow-up care is a key part of your treatment and safety. Be sure to make and go to all appointments, and call your doctor if you are having problems. It's also a good idea to know your test results and keep a list of the medicines you take.  What are the main screening tests for colon cancer?  ?? Stool tests. These include the fecal immunochemical test (FIT) and the fecal occult blood test (FOBT). These tests check stool samples for signs of cancer. If your test is positive, you will need to have a colonoscopy.  ?? Sigmoidoscopy. This test lets your doctor look at the lining of your rectum and the lowest part of your colon. Your doctor uses a lighted tube called a sigmoidoscope. This test can't find cancers or polyps in the upper part of your colon. In some cases, polyps that are found can be removed. But if your doctor finds polyps, you will need to have a colonoscopy to check the upper part of your colon.  ?? Colonoscopy. This test lets your doctor look at the lining of your  rectum and your entire colon. The doctor uses a thin, flexible tool called a colonoscope. It can also be used to remove polyps or get a tissue sample (biopsy).  What tests do you need?  The following guidelines are for adults who are not at high risk for colorectal cancer. You may have at least one of these tests as directed by your doctor.  ?? Fecal immunochemical test (FIT) or guaiac fecal occult blood test (gFOBT) every year  ?? Sigmoidoscopy every 5 years  ?? Colonoscopy every 10 years  If you are over age 75, you can work with your doctor to decide if screening is a good option.  Talk with your doctor about when you need to be tested. And discuss which tests are right for you.  Your doctor may recommend earlier or more frequent testing if you:  ?? Have had colorectal cancer before.  ?? Have had colon polyps.  ?? Have symptoms of colorectal cancer. These include blood in your stool and changes in your bowel habits.  ?? Have a parent, brother or sister, or child with colon polyps or colorectal cancer.  ?? Have a bowel disease. This includes ulcerative colitis and Crohn's disease.  ?? Have a rare polyp syndrome that runs in families, such as familial adenomatous polyposis (FAP).  ?? Have had radiation treatments to the belly or pelvis.  When should you call for help?  Watch closely for changes in your   health, and be sure to contact your doctor if:  ?? ?? You have any changes in your bowel habits.   ?? ?? You have any problems.   Where can you learn more?  Go to http://www.healthwise.net/[facility].  Enter M541 in the search box to learn more about "Colon Cancer Screening: Care Instructions."  Current as of: June 19, 2017  Content Version: 12.2  ?? 2006-2019 [employer]wise, [employer]. Care instructions adapted under license by [employer] (which disclaims liability or warranty for this information). If you have questions about a medical condition or  this instruction, always ask your healthcare professional. [employer]wise[employer] disclaims any warranty or liability for your use of this information.

## 2018-05-02 NOTE — Progress Notes (Signed)
Pt here for   Chief Complaint   Patient presents with   ??? Follow-up     6 month recheck     1. Have you been to the ER, urgent care clinic since your last visit?  Hospitalized since your last visit?No    2. Have you seen or consulted any other health care providers outside of the  Health System since your last visit?  Include any pap smears or colon screening. No       Pt denies pain at this time    3 most recent PHQ Screens 05/02/2018   Little interest or pleasure in doing things Not at all   Feeling down, depressed, irritable, or hopeless Not at all   Total Score PHQ 2 0

## 2018-05-02 NOTE — Progress Notes (Signed)
Subjective: (As above and below)     Chief Complaint   Patient presents with   ??? Follow-up     6 month recheck     Heather Sosa is a 52 y.o. year old female who presents for     Hypertension ROS:  taking medications as instructed, no medication side effects noted, no TIAs, no chest pain on exertion, no dyspnea on exertion, no swelling of ankles  ??  Sarcoid: followed by sarcoid clinic at [facility],  ??  ICD : followed by cardiology at [facility]. 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 She has been taking the eliquis    ??  Colonoscopy: not done yet. No family hx of colon ca, no GI problems.    Wt Readings from Last 3 Encounters:   05/02/18 289 lb (131.1 kg)   08/29/17 303 lb 4.8 oz (137.6 kg)   01/25/17 276 lb 1.6 oz (125.2 kg)         Reviewed PmHx, RxHx, FmHx, SocHx, AllgHx and updated in chart.  Family History   Problem Relation Age of Onset   ??? Cancer Maternal Grandmother         unknown   ??? Cancer Paternal Grandmother         breast       Past Medical History:   Diagnosis Date   ??? Cardiac defibrillator in place 2018   ??? Costochondral chest pain    ??? High cholesterol    ??? Sarcoid       Social History     Socioeconomic History   ??? Marital status: DIVORCED     Spouse name: Not on file   ??? Number of children: Not on file   ??? Years of education: Not on file   ??? Highest education level: Not on file   Tobacco Use   ??? Smoking status: Never Smoker   ??? Smokeless tobacco: Never Used   Substance and Sexual Activity   ??? Alcohol use: No   ??? Drug use: No   ??? Sexual activity: Never          Current Outpatient Medications   Medication Sig   ??? ELIQUIS 5 mg tablet    ??? losartan (COZAAR) 25 mg tablet Take 1 Tab by mouth daily.   ??? dorzolamide (TRUSOPT) 2 % ophthalmic solution    ??? folic acid (FOLVITE) 1 mg tablet    ??? methotrexate (RHEUMATREX) 2.5 mg tablet    ??? SYMBICORT 80-4.5 mcg/actuation HFAA     ??? hydrocortisone (CORTAID) 1 % topical cream Apply  to affected area two (2) times a day. use thin layer   ??? albuterol (PROAIR HFA) 90 mcg/actuation inhaler Take 1 Puff by inhalation every four (4) hours as needed for Wheezing.     No current facility-administered medications for this visit.        Review of Systems:   Constitutional:    Negative for fever and chills, negative diaphoresis.   HEENT:              Negative for neck pain and stiffness.  Eyes:                  Negative for visual disturbance, itching, redness or discharge.   Respiratory:        Negative for cough and shortness of breath.   Cardiovascular:  Negative for chest pain and palpitations.  Gastrointestinal: Negative for nausea, vomiting, abdominal pain, diarrhea or constipation.  Genitourinary:     Negative for dysuria and frequency.   Musculoskeletal: Negative for falls, tenderness and swelling.  Skin:                    Negative for rash, masses or lesions.   Neurological:       Negative for dizzyness, seizure, loss of consciousness, weakness and numbness.     Objective:     Vitals:    05/02/18 1328   BP: 115/74   Pulse: 79   Resp: 18   Temp: 98.9 ??F (37.2 ??C)   TempSrc: Oral   SpO2: 97%   Weight: 289 lb (131.1 kg)   Height: 5\' 6"  (1.676 m)       Results for orders placed or performed in visit on 08/29/17   CULTURE, URINE   Result Value Ref Range    Urine Culture, Routine No growth        Gen: Oriented to person, place and time and well-developed, well-nourished and in no distress.   HEENT:    Head: normocephalic and atraumatic.    Eyes:  EOM are normal. Pupils equal and round.    Neck:  Normal range of motion.  Neck supple.    Cardiovascular: normal rate, regular rhythm and normal heart sounds.   Pulmonary/Chest:  Effort normal and breath sounds normal.  No respiratory distress.  No wheezes, no rales.   Abdominal: soft, normal  bowel sounds.   Musculoskeletal:  No edema, no tenderness.  No calf tenderness or edema.    Neurological:  Alert, oriented to person, place and time.    Skin: skin is warm and dry.         Assessment/ Plan:     Follow-up and Dispositions    ?? Return in about 6 months (around 10/31/2018), or if symptoms worsen or fail to improve.         1. Essential hypertension  good  - METABOLIC PANEL, COMPREHENSIVE  - CBC W/O DIFF  - LIPID PANEL    2. ICD (implantable cardioverter-defibrillator) in place  Followed by cardio    3. Sarcoid  followed by pulm    4. Colon cancer screening  Pt aware that if + will refer to colonoscopy  - COLOGUARD TEST (FECAL DNA COLORECTAL CANCER SCREENING)    5. Mixed hyperlipidemia        I have discussed the diagnosis with the patient and the intended plan as seen in the above orders.  The patient has received an after-visit summary and questions were answered concerning future plans.  Pt conveyed understanding of plan.      Medication Side Effects and Warnings were discussed with patient: yes  Patient Labs were reviewed: yes  Patient Past Records were reviewed:  yes    Heather Sosa Z. Doreatha Massed, NP

## 2018-05-03 LAB — CVD REPORT

## 2018-05-03 LAB — CBC
Hematocrit: 35.4 % (ref 34.0–46.6)
Hemoglobin: 11.6 g/dL (ref 11.1–15.9)
MCH: 27.2 pg (ref 26.6–33.0)
MCHC: 32.8 g/dL (ref 31.5–35.7)
MCV: 83 fL (ref 79–97)
Platelets: 282 10*3/uL (ref 150–450)
RBC: 4.26 x10E6/uL (ref 3.77–5.28)
RDW: 13.6 % (ref 12.3–15.4)
WBC: 5.7 10*3/uL (ref 3.4–10.8)

## 2018-05-03 LAB — LIPID PANEL
Cholesterol, Total: 196 mg/dL (ref 100–199)
Cholesterol, total: 196 mg/dL (ref 100–199)
HDL Cholesterol: 48 mg/dL (ref >39)
HDL: 48 mg/dL (ref >39)
LDL Calculated: 135 mg/dL — ABNORMAL HIGH (ref 0–99)
LDL, calculated: 135 mg/dL — ABNORMAL HIGH (ref 0–99)
Triglyceride: 66 mg/dL (ref 0–149)
Triglycerides: 66 mg/dL (ref 0–149)
VLDL Cholesterol Calculated: 13 mg/dL (ref 5–40)
VLDL, calculated: 13 mg/dL (ref 5–40)

## 2018-05-03 LAB — COMPREHENSIVE METABOLIC PANEL
ALT: 34 IU/L — ABNORMAL HIGH (ref 0–32)
AST: 33 IU/L (ref 0–40)
Albumin/Globulin Ratio: 1.1 NA — ABNORMAL LOW (ref 1.2–2.2)
Albumin: 3.8 g/dL (ref 3.5–5.5)
Alkaline Phosphatase: 221 IU/L — ABNORMAL HIGH (ref 39–117)
BUN: 17 mg/dL (ref 6–24)
Bun/Cre Ratio: 18 NA (ref 9–23)
CO2: 25 mmol/L (ref 20–29)
Calcium: 9.4 mg/dL (ref 8.7–10.2)
Chloride: 102 mmol/L (ref 96–106)
Creatinine: 0.94 mg/dL (ref 0.57–1.00)
EGFR IF NonAfrican American: 70 mL/min/{1.73_m2} (ref >59)
GFR African American: 81 mL/min/{1.73_m2} (ref >59)
Globulin, Total: 3.4 g/dL (ref 1.5–4.5)
Glucose: 77 mg/dL (ref 65–99)
Potassium: 4.1 mmol/L (ref 3.5–5.2)
Sodium: 140 mmol/L (ref 134–144)
Total Bilirubin: 0.4 mg/dL (ref 0.0–1.2)
Total Protein: 7.2 g/dL (ref 6.0–8.5)

## 2018-05-03 LAB — METABOLIC PANEL, COMPREHENSIVE
A-G Ratio: 1.1 — ABNORMAL LOW (ref 1.2–2.2)
ALT (SGPT): 34 IU/L — ABNORMAL HIGH (ref 0–32)
AST (SGOT): 33 IU/L (ref 0–40)
Albumin: 3.8 g/dL (ref 3.5–5.5)
Alk. phosphatase: 221 IU/L — ABNORMAL HIGH (ref 39–117)
BUN/Creatinine ratio: 18 (ref 9–23)
BUN: 17 mg/dL (ref 6–24)
Bilirubin, total: 0.4 mg/dL (ref 0.0–1.2)
CO2: 25 mmol/L (ref 20–29)
Calcium: 9.4 mg/dL (ref 8.7–10.2)
Chloride: 102 mmol/L (ref 96–106)
Creatinine: 0.94 mg/dL (ref 0.57–1.00)
GFR est AA: 81 mL/min/{1.73_m2} (ref >59)
GFR est non-AA: 70 mL/min/{1.73_m2} (ref >59)
GLOBULIN, TOTAL: 3.4 g/dL (ref 1.5–4.5)
Glucose: 77 mg/dL (ref 65–99)
Potassium: 4.1 mmol/L (ref 3.5–5.2)
Protein, total: 7.2 g/dL (ref 6.0–8.5)
Sodium: 140 mmol/L (ref 134–144)

## 2018-05-03 LAB — CBC W/O DIFF
HCT: 35.4 % (ref 34.0–46.6)
HGB: 11.6 g/dL (ref 11.1–15.9)
MCH: 27.2 pg (ref 26.6–33.0)
MCHC: 32.8 g/dL (ref 31.5–35.7)
MCV: 83 fL (ref 79–97)
PLATELET: 282 10*3/uL (ref 150–450)
RBC: 4.26 x10E6/uL (ref 3.77–5.28)
RDW: 13.6 % (ref 12.3–15.4)
WBC: 5.7 10*3/uL (ref 3.4–10.8)

## 2018-05-06 ENCOUNTER — Encounter

## 2018-10-31 ENCOUNTER — Encounter: Attending: Family | Primary: Family

## 2018-11-03 ENCOUNTER — Telehealth: Admit: 2018-11-03 | Discharge: 2018-11-03 | Payer: PRIVATE HEALTH INSURANCE | Attending: Family | Primary: Family

## 2018-11-03 ENCOUNTER — Telehealth: Attending: Family | Primary: Family

## 2018-11-03 DIAGNOSIS — D869 Sarcoidosis, unspecified: Secondary | ICD-10-CM

## 2018-11-03 NOTE — Progress Notes (Signed)
Pt is here for   Chief Complaint   Patient presents with   . Follow-up     6 month      Pt denies pain at this time    1. Have you been to the ER, urgent care clinic since your last visit?  Hospitalized since your last visit?No    2. Have you seen or consulted any other health care providers outside of the [facility] since your last visit?  Include any pap smears or colon screening. No

## 2018-11-03 NOTE — Progress Notes (Signed)
Heather Sosa is a 53 y.o. female who was seen by synchronous (real-time) audio-video technology on 11/03/2018.      Consent: Heather QueryJewel Legrand, who was seen by synchronous (real-time) audio-video technology, and/or Heather Sosa healthcare decision maker, is aware that this patient-initiated, Telehealth encounter on 11/03/2018 is a billable service, with coverage as determined by Heather Sosa insurance carrier. She is aware that she may receive a bill and has provided verbal consent to proceed: Yes.      Assessment & Plan:   Diagnoses and all orders for this visit:    1. Sarcoid    2. ICD (implantable cardioverter-defibrillator) in place    3. Essential hypertension      The complexity of medical decision making for this visit is moderate     Check BP at home - fu if out of range      I spent at least 23 minutes on this visit with this established patient. ([id number])    Subjective:   Heather Sosa is a 53 y.o. female who was seen for Follow-up (6 month )      Prior to Admission medications    Medication Sig Start Date End Date Taking? Authorizing Provider   ELIQUIS 5 mg tablet  04/03/18  Yes Provider, Historical   losartan (COZAAR) 25 mg tablet Take 1 Tab by mouth daily. 05/02/18  Yes Amado NashNakoneczny, Elbony Mcclimans Z., NP   folic acid (FOLVITE) 1 mg tablet  08/19/17  Yes Provider, Historical   methotrexate (RHEUMATREX) 2.5 mg tablet  08/12/17  Yes Provider, Historical   SYMBICORT 80-4.5 mcg/actuation HFAA  01/23/17  Yes Provider, Historical   hydrocortisone (CORTAID) 1 % topical cream Apply  to affected area two (2) times a day. use thin layer 01/25/17  Yes Amado NashNakoneczny, Gibson Telleria Z., NP   albuterol (PROAIR HFA) 90 mcg/actuation inhaler Take 1 Puff by inhalation every four (4) hours as needed for Wheezing. 10/04/16  Yes Amado NashNakoneczny, Bridey Brookover Z., NP   dorzolamide (TRUSOPT) 2 % ophthalmic solution  08/19/17   Provider, Historical     Allergies   Allergen Reactions   ??? Lisinopril Rash     Timing of rash c/w start and stop of lisinopril. No angioedema       Patient Active Problem  List   Diagnosis Code   ??? Sarcoidosis D86.9   ??? High cholesterol E78.00   ??? BMI 45.0-49.9, adult (HCC) Z68.42   ??? OSA (obstructive sleep apnea) G47.33   ??? Vocal cord polyps J38.1   ??? GERD (gastroesophageal reflux disease) K21.9   ??? Glaucoma H40.9   ??? 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 OSA (obstructive sleep apnea) 06/26/2016   ??? Vocal cord polyps 06/26/2016   ??? GERD (gastroesophageal reflux disease) 06/26/2016   ??? Glaucoma 06/26/2016   ??? Sarcoidosis 06/15/2016   ??? High cholesterol 06/15/2016   ??? BMI 45.0-49.9, adult (HCC) 06/15/2016     Current Outpatient Medications   Medication Sig Dispense Refill   ??? ELIQUIS 5 mg tablet      ??? losartan (COZAAR) 25 mg tablet Take 1 Tab by mouth daily. 90 Tab 1   ??? folic acid (FOLVITE) 1 mg tablet      ??? methotrexate (RHEUMATREX) 2.5 mg tablet      ??? SYMBICORT 80-4.5 mcg/actuation HFAA      ??? hydrocortisone (CORTAID) 1 % topical cream Apply  to  affected area two (2) times a day. use thin layer 30 g 0   ??? albuterol (PROAIR HFA) 90 mcg/actuation inhaler Take 1 Puff by inhalation every four (4) hours as needed for Wheezing. 1 Inhaler 1   ??? dorzolamide (TRUSOPT) 2 % ophthalmic solution        Allergies   Allergen Reactions   ??? Lisinopril Rash     Timing of rash c/w start and stop of lisinopril. No angioedema     Past Medical History:   Diagnosis Date   ??? Cardiac defibrillator in place 2018   ??? Costochondral chest pain    ??? High cholesterol    ??? Sarcoid      Past Surgical History:   Procedure Laterality Date   ??? CARDIAC SURG PROCEDURE UNLIST     ??? HX ORTHOPAEDIC  1997    right ankle - break, with screws     Family History   Problem Relation Age of Onset   ??? Cancer Maternal Grandmother         unknown   ??? Cancer Paternal Grandmother         breast     Social History     Tobacco Use   ??? Smoking status: Never Smoker   ??? Smokeless  tobacco: Never Used   Substance Use Topics   ??? Alcohol use: No       ROS     Review of Systems:   Constitutional:    Negative for fever and chills, negative diaphoresis.   HEENT:              Negative for neck pain and stiffness.  Eyes:                  Negative for visual disturbance, itching, redness or discharge.   Respiratory:        Negative for cough and shortness of breath.   Cardiovascular:  Negative for chest pain and palpitations.   Gastrointestinal: Negative for nausea, vomiting, abdominal pain, diarrhea and             constipation.   Genitourinary:     Negative for dysuria and frequency.   Musculoskeletal: Negative for falls, tenderness and swelling.  Skin:                    Negative for rash, masses or lesions.   Neurological:       Negative for dizzyness, seizure, loss of consciousness, weakness and numbness.         Hypertension ROS:  taking medications as instructed, no medication side effects noted, no TIAs, no chest pain on exertion, no dyspnea on exertion, no swelling of ankles  Has BP cuff at home, does not recall what the value was but states it was good    ICD in place: has had virtual visit w/ cardio    Sarcoid: is being followed by pulm    She reports feeling well.     Objective:   Vital Signs: (As obtained by patient/caregiver at home)  There were no vitals taken for this visit.     [INSTRUCTIONS:  " " Indicates a positive item  " " Indicates a negative item  -- DELETE ALL ITEMS NOT EXAMINED]    Constitutional:  Appears well-developed and well-nourished  No apparent distress       Abnormal -     Mental status:  Alert and awake   Oriented to person/place/time  Able to follow commands      Abnormal -     Eyes:   EOM    [x]   Normal    []  Abnormal -   Sclera  [x]   Normal    []  Abnormal -          Discharge [x]   None visible   []  Abnormal -     HENT: [x]  Normocephalic, atraumatic  []  Abnormal -   [x]  Mouth/Throat: Mucous membranes are moist    External Ears [x]  Normal  []   Abnormal -    Neck: [x]  No visualized mass []  Abnormal -     Pulmonary/Chest: [x]  Respiratory effort normal   [x]  No visualized signs of difficulty breathing or respiratory distress        []  Abnormal -      Musculoskeletal:   [x]  Normal gait with no signs of ataxia         [x]  Normal range of motion of neck        []  Abnormal -     Neurological:        [x]  No Facial Asymmetry (Cranial nerve 7 motor function) (limited exam due to video visit)          [x]  No gaze palsy        []  Abnormal -          Skin:        [x]  No significant exanthematous lesions or discoloration noted on facial skin         []  Abnormal -            Psychiatric:       [x]  Normal Affect []  Abnormal -        [x]  No Hallucinations    Other pertinent observable physical exam findings:-        We discussed the expected course, resolution and complications of the diagnosis(es) in detail.  Medication risks, benefits, costs, interactions, and alternatives were discussed as indicated.  I advised Heather Sosa to contact the office if Heather Sosa condition worsens, changes or fails to improve as anticipated. She expressed understanding with the diagnosis(es) and plan.       Heather Sosa is a 53 y.o. female who was evaluated by a video visit encounter for concerns as above. Patient identification was verified prior to start of the visit. A caregiver was present when appropriate. 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  Pursuant to the emergency declaration under the [facility] Act and the [employer], 1135 waiver authority and the [employer] and [employer] Act, this Virtual  Visit was conducted, with patient's (and/or legal guardian's) consent, to reduce the patient's risk of exposure to COVID-19 and provide necessary medical care.     Services were  provided through a video synchronous discussion virtually to substitute for in-person clinic visit.   Patient and provider were located at their individual homes.      Joneric Streight Z. Doreatha Massed, NP

## 2018-11-03 NOTE — Progress Notes (Signed)
Heather Sosa is a 53 y.o. female who was seen by synchronous (real-time) audio-video technology on 11/03/2018.      Consent: Heather Sosa, who was seen by synchronous (real-time) audio-video technology, and/or her healthcare decision maker, is aware that this patient-initiated, Telehealth encounter on 11/03/2018 is a billable service, with coverage as determined by her insurance carrier. She is aware that she may receive a bill and has provided verbal consent to proceed: Yes.      Assessment & Plan:   Diagnoses and all orders for this visit:    1. Sarcoid    2. ICD (implantable cardioverter-defibrillator) in place    3. Essential hypertension      The complexity of medical decision making for this visit is moderate     Check BP at home - fu if out of range      I spent at least 23 minutes on this visit with this established patient. ([id number])    Subjective:   Heather Sosa is a 53 y.o. female who was seen for Follow-up (6 month )      Prior to Admission medications    Medication Sig Start Date End Date Taking? Authorizing Provider   ELIQUIS 5 mg tablet  04/03/18  Yes Provider, Historical   losartan (COZAAR) 25 mg tablet Take 1 Tab by mouth daily. 05/02/18  Yes Amado Nash Z., NP   folic acid (FOLVITE) 1 mg tablet  08/19/17  Yes Provider, Historical   methotrexate (RHEUMATREX) 2.5 mg tablet  08/12/17  Yes Provider, Historical   SYMBICORT 80-4.5 mcg/actuation HFAA  01/23/17  Yes Provider, Historical   hydrocortisone (CORTAID) 1 % topical cream Apply  to affected area two (2) times a day. use thin layer 01/25/17  Yes Amado Nash Z., NP   albuterol (PROAIR HFA) 90 mcg/actuation inhaler Take 1 Puff by inhalation every four (4) hours as needed for Wheezing. 10/04/16  Yes Amado Nash Z., NP   dorzolamide (TRUSOPT) 2 % ophthalmic solution  08/19/17   Provider, Historical     Allergies   Allergen Reactions   ??? Lisinopril Rash     Timing of rash c/w start and stop of lisinopril. No angioedema        Patient Active Problem List   Diagnosis Code   ??? Sarcoidosis D86.9   ??? High cholesterol E78.00   ??? BMI 45.0-49.9, adult (HCC) Z68.42   ??? OSA (obstructive sleep apnea) G47.33   ??? Vocal cord polyps J38.1   ??? GERD (gastroesophageal reflux disease) K21.9   ??? Glaucoma H40.9   ??? 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 OSA (obstructive sleep apnea) 06/26/2016   ??? Vocal cord polyps 06/26/2016   ??? GERD (gastroesophageal reflux disease) 06/26/2016   ??? Glaucoma 06/26/2016   ??? Sarcoidosis 06/15/2016   ??? High cholesterol 06/15/2016   ??? BMI 45.0-49.9, adult (HCC) 06/15/2016     Current Outpatient Medications   Medication Sig Dispense Refill   ??? ELIQUIS 5 mg tablet      ??? losartan (COZAAR) 25 mg tablet Take 1 Tab by mouth daily. 90 Tab 1   ??? folic acid (FOLVITE) 1 mg tablet      ??? methotrexate (RHEUMATREX) 2.5 mg tablet      ??? SYMBICORT 80-4.5 mcg/actuation HFAA      ??? hydrocortisone (CORTAID) 1 % topical cream Apply  to  affected area two (2) times a day. use thin layer 30 g 0   ??? albuterol (PROAIR HFA) 90 mcg/actuation inhaler Take 1 Puff by inhalation every four (4) hours as needed for Wheezing. 1 Inhaler 1   ??? dorzolamide (TRUSOPT) 2 % ophthalmic solution        Allergies   Allergen Reactions   ??? Lisinopril Rash     Timing of rash c/w start and stop of lisinopril. No angioedema     Past Medical History:   Diagnosis Date   ??? Cardiac defibrillator in place 2018   ??? Costochondral chest pain    ??? High cholesterol    ??? Sarcoid      Past Surgical History:   Procedure Laterality Date   ??? CARDIAC SURG PROCEDURE UNLIST     ??? HX ORTHOPAEDIC  1997    right ankle - break, with screws     Family History   Problem Relation Age of Onset   ??? Cancer Maternal Grandmother         unknown   ??? Cancer Paternal Grandmother         breast     Social History     Tobacco Use    ??? Smoking status: Never Smoker   ??? Smokeless tobacco: Never Used   Substance Use Topics   ??? Alcohol use: No       ROS     Review of Systems:   Constitutional:    Negative for fever and chills, negative diaphoresis.   HEENT:              Negative for neck pain and stiffness.  Eyes:                  Negative for visual disturbance, itching, redness or discharge.   Respiratory:        Negative for cough and shortness of breath.   Cardiovascular:  Negative for chest pain and palpitations.   Gastrointestinal: Negative for nausea, vomiting, abdominal pain, diarrhea and             constipation.   Genitourinary:     Negative for dysuria and frequency.   Musculoskeletal: Negative for falls, tenderness and swelling.  Skin:                    Negative for rash, masses or lesions.   Neurological:       Negative for dizzyness, seizure, loss of consciousness, weakness and numbness.         Hypertension ROS:  taking medications as instructed, no medication side effects noted, no TIAs, no chest pain on exertion, no dyspnea on exertion, no swelling of ankles  Has BP cuff at home, does not recall what the value was but states it was good    ICD in place: has had virtual visit w/ cardio    Sarcoid: is being followed by pulm    She reports feeling well.     Objective:   Vital Signs: (As obtained by patient/caregiver at home)  There were no vitals taken for this visit.     [INSTRUCTIONS:  "[x] " Indicates a positive item  "[] " Indicates a negative item  -- DELETE ALL ITEMS NOT EXAMINED]    Constitutional: [x]  Appears well-developed and well-nourished [x]  No apparent distress      []  Abnormal -     Mental status: [x]  Alert and awake  [x]  Oriented to person/place/time [x]  Able to follow commands    []   Abnormal -     Eyes:   EOM    [x]   Normal    []  Abnormal -   Sclera  [x]   Normal    []  Abnormal -          Discharge [x]   None visible   []  Abnormal -     HENT: [x]  Normocephalic, atraumatic  []  Abnormal -    [x]  Mouth/Throat: Mucous membranes are moist    External Ears [x]  Normal  []  Abnormal -    Neck: [x]  No visualized mass []  Abnormal -     Pulmonary/Chest: [x]  Respiratory effort normal   [x]  No visualized signs of difficulty breathing or respiratory distress        []  Abnormal -      Musculoskeletal:   [x]  Normal gait with no signs of ataxia         [x]  Normal range of motion of neck        []  Abnormal -     Neurological:        [x]  No Facial Asymmetry (Cranial nerve 7 motor function) (limited exam due to video visit)          [x]  No gaze palsy        []  Abnormal -          Skin:        [x]  No significant exanthematous lesions or discoloration noted on facial skin         []  Abnormal -            Psychiatric:       [x]  Normal Affect []  Abnormal -        [x]  No Hallucinations    Other pertinent observable physical exam findings:-        We discussed the expected course, resolution and complications of the diagnosis(es) in detail.  Medication risks, benefits, costs, interactions, and alternatives were discussed as indicated.  I advised her to contact the office if her condition worsens, changes or fails to improve as anticipated. She expressed understanding with the diagnosis(es) and plan.       Heather Sosa is a 53 y.o. female who was evaluated by a video visit encounter for concerns as above. Patient identification was verified prior to start of the visit. A caregiver was present when appropriate. Due to this being a Scientist, research (medical)TeleHealth encounter (During COVID-19 public health emergency), evaluation of the following organ systems was limited: Vitals/Constitutional/EENT/Resp/CV/GI/GU/MS/Neuro/Skin/Heme-Lymph-Imm.  Pursuant to the emergency declaration under the [facility] Act and the [employer] Emergen[employer] Act, 1135 waiver authority and the [employer] and [employer] Supplemental Appropriations Act, this Virtual  Visit was conducted, with patient's (and/or legal guardian's) consent, to  reduce the patient's risk of exposure to COVID-19 and provide necessary medical care.     Services were provided through a video synchronous discussion virtually to substitute for in-person clinic visit.   Patient and provider were located at their individual homes.      Heather Santoli Z. Doreatha MassedNakoneczny, NP

## 2018-11-03 NOTE — Progress Notes (Signed)
Pt is here for   Chief Complaint   Patient presents with   ??? Follow-up     6 month      Pt denies pain at this time    1. Have you been to the ER, urgent care clinic since your last visit?  Hospitalized since your last visit?No    2. Have you seen or consulted any other health care providers outside of the [facility] since your last visit?  Include any pap smears or colon screening. No

## 2018-12-01 ENCOUNTER — Telehealth: Admit: 2018-12-01 | Discharge: 2018-12-01 | Payer: PRIVATE HEALTH INSURANCE | Attending: Family | Primary: Family

## 2018-12-01 ENCOUNTER — Telehealth: Attending: Family | Primary: Family

## 2018-12-01 DIAGNOSIS — M79675 Pain in left toe(s): Secondary | ICD-10-CM

## 2018-12-01 NOTE — Progress Notes (Signed)
 Chief Complaint   Patient presents with   . Toe Pain     left toe, started sunday morning (11/30/2018), pt states she has taken 2 aleeve w/ temporary relief, no injury known        1. Have you been to the ER, urgent care clinic since your last visit?  Hospitalized since your last visit?No    2. Have you seen or consulted any other health care providers outside of the [facility] since your last visit?  Include any pap smears or colon screening. No

## 2018-12-01 NOTE — Progress Notes (Signed)
Heather Sosa is a 53 y.o. female who was seen by synchronous (real-time) audio-video technology on 12/01/2018.      Consent: Heather Sosa, who was seen by synchronous (real-time) audio-video technology, and/or her healthcare decision maker, is aware that this patient-initiated, Telehealth encounter on 12/01/2018 is a billable service, with coverage as determined by her insurance carrier. She is aware that she may receive a bill and has provided verbal consent to proceed: Yes.      Assessment & Plan:   Diagnoses and all orders for this visit:    1. Great toe pain, left      The complexity of medical decision making for this visit is moderate         I spent at least 23 minutes on this visit with this established patient.    Overall improving, rest, ice, tylenol  Fu INI    Elevated alk phos- her pulm for sarcoid checked labs last month, alk phos still high but trending down      Subjective:   Heather Sosa is a 53 y.o. female who was seen for Toe Pain (left toe, started sunday morning (11/30/2018), pt states she has taken 2 aleeve w/ temporary relief, no injury known )      Prior to Admission medications    Medication Sig Start Date End Date Taking? Authorizing Provider   ELIQUIS 5 mg tablet  04/03/18  Yes Provider, Historical   losartan (COZAAR) 25 mg tablet Take 1 Tab by mouth daily. 05/02/18  Yes Lesle Chris Z., NP   folic acid (FOLVITE) 1 mg tablet  08/19/17  Yes Provider, Historical   methotrexate (RHEUMATREX) 2.5 mg tablet  08/12/17  Yes Provider, Historical   SYMBICORT 80-4.5 mcg/actuation HFAA  01/23/17  Yes Provider, Historical   albuterol (PROAIR HFA) 90 mcg/actuation inhaler Take 1 Puff by inhalation every four (4) hours as needed for Wheezing. 10/04/16  Yes Lesle Chris Z., NP   dorzolamide (TRUSOPT) 2 % ophthalmic solution  08/19/17   Provider, Historical   hydrocortisone (CORTAID) 1 % topical cream Apply  to affected area two (2) times a day. use thin layer 01/25/17   Trula Ore., NP      Allergies   Allergen Reactions   ??? Lisinopril Rash     Timing of rash c/w start and stop of lisinopril. No angioedema       Patient Active Problem List   Diagnosis Code   ??? Sarcoidosis D86.9   ??? High cholesterol E78.00   ??? BMI 45.0-49.9, adult (HCC) Z68.42   ??? OSA (obstructive sleep apnea) G47.33   ??? Vocal cord polyps J38.1   ??? GERD (gastroesophageal reflux disease) K21.9   ??? Glaucoma H40.9   ??? 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 OSA (obstructive sleep apnea) 06/26/2016   ??? Vocal cord polyps 06/26/2016   ??? GERD (gastroesophageal reflux disease) 06/26/2016   ??? Glaucoma 06/26/2016   ??? Sarcoidosis 06/15/2016   ??? High cholesterol 06/15/2016   ??? BMI 45.0-49.9, adult ([facility]) 06/15/2016       ROS     L great toe pain: x few days, no injury. Pain w/ weight bearing. No warmth, redness, swelling. Since onset pain is improving.she has been exercising more and has been unable today d/t pain  She tried aleve which helped a little, she asks if she can  take tylenol      Objective:   Vital Signs: (As obtained by patient/caregiver at home)  There were no vitals taken for this visit.     [INSTRUCTIONS:  "'[x]'$ " Indicates a positive item  "'[]'$ " Indicates a negative item  -- DELETE ALL ITEMS NOT EXAMINED]    Constitutional: '[x]'$  Appears well-developed and well-nourished '[x]'$  No apparent distress      '[]'$  Abnormal -     Mental status: '[x]'$  Alert and awake  '[x]'$  Oriented to person/place/time '[x]'$  Able to follow commands    '[]'$  Abnormal -     Eyes:   EOM    '[x]'$   Normal    '[]'$  Abnormal -   Sclera  '[x]'$   Normal    '[]'$  Abnormal -          Discharge '[x]'$   None visible   '[]'$  Abnormal -     HENT: '[x]'$  Normocephalic, atraumatic  '[]'$  Abnormal -   '[x]'$  Mouth/Throat: Mucous membranes are moist    External Ears '[x]'$  Normal  '[]'$  Abnormal -    Neck: '[x]'$  No visualized mass '[]'$  Abnormal -     Pulmonary/Chest: '[x]'$   Respiratory effort normal   '[x]'$  No visualized signs of difficulty breathing or respiratory distress        '[]'$  Abnormal -      Musculoskeletal:   '[x]'$  Normal gait with no signs of ataxia         '[x]'$  Normal range of motion of neck        '[]'$  Abnormal -     Neurological:        '[x]'$  No Facial Asymmetry (Cranial nerve 7 motor function) (limited exam due to video visit)          '[x]'$  No gaze palsy        '[]'$  Abnormal -          Skin:        '[x]'$  No significant exanthematous lesions or discoloration noted on facial skin         '[]'$  Abnormal -            Psychiatric:       '[x]'$  Normal Affect '[]'$  Abnormal -        '[x]'$  No Hallucinations    Other pertinent observable physical exam findings:-        We discussed the expected course, resolution and complications of the diagnosis(es) in detail.  Medication risks, benefits, costs, interactions, and alternatives were discussed as indicated.  I advised her to contact the office if her condition worsens, changes or fails to improve as anticipated. She expressed understanding with the diagnosis(es) and plan.       Heather Sosa is a 53 y.o. female who was evaluated by a video visit encounter for concerns as above. Patient identification was verified prior to start of the visit. A caregiver was present when appropriate. Due to this being a Scientist, physiological (During YHCWC-37 public health emergency), evaluation of the following organ systems was limited: Vitals/Constitutional/EENT/Resp/CV/GI/GU/MS/Neuro/Skin/Heme-Lymph-Imm.  Pursuant to the emergency declaration under the [place], 1135 waiver authority and the [employer] Act, this Virtual  Visit was conducted, with patient's (and/or legal guardian's) consent, to reduce the patient's risk of exposure to COVID-19 and provide necessary medical care.     Services were provided through a video synchronous discussion virtually to substitute for in-person  clinic visit.   Patient and provider were located at their  individual homes.      Heather Sosa Z. Vladimir Crofts, NP

## 2018-12-01 NOTE — Progress Notes (Signed)
Chief Complaint   Patient presents with   ??? Toe Pain     left toe, started sunday morning (11/30/2018), pt states she has taken 2 aleeve w/ temporary relief, no injury known        1. Have you been to the ER, urgent care clinic since your last visit?  Hospitalized since your last visit?No    2. Have you seen or consulted any other health care providers outside of the [facility] since your last visit?  Include any pap smears or colon screening. No

## 2018-12-01 NOTE — Progress Notes (Signed)
Heather Sosa is a 53 y.o. female who was seen by synchronous (real-time) audio-video technology on 12/01/2018.      Consent: [name], who was seen by synchronous (real-time) audio-video technology, and/or her healthcare decision maker, is aware that this patient-initiated, Telehealth encounter on 12/01/2018 is a billable service, with coverage as determined by her insurance carrier. She is aware that she may receive a bill and has provided verbal consent to proceed: Yes.      Assessment & Plan:   Diagnoses and all orders for this visit:    1. Great toe pain, left      The complexity of medical decision making for this visit is moderate         I spent at least 23 minutes on this visit with this established patient.    Overall improving, rest, ice, tylenol  Fu INI    Elevated alk phos- her pulm for sarcoid checked labs last month, alk phos still high but trending down      Subjective:   Heather Sosa is a 53 y.o. female who was seen for Toe Pain (left toe, started sunday morning (11/30/2018), pt states she has taken 2 aleeve w/ temporary relief, no injury known )      Prior to Admission medications    Medication Sig Start Date End Date Taking? Authorizing Provider   ELIQUIS 5 mg tablet  04/03/18  Yes Provider, Historical   losartan (COZAAR) 25 mg tablet Take 1 Tab by mouth daily. 05/02/18  Yes Lesle Chris Z., NP   folic acid (FOLVITE) 1 mg tablet  08/19/17  Yes Provider, Historical   methotrexate (RHEUMATREX) 2.5 mg tablet  08/12/17  Yes Provider, Historical   SYMBICORT 80-4.5 mcg/actuation HFAA  01/23/17  Yes Provider, Historical   albuterol (PROAIR HFA) 90 mcg/actuation inhaler Take 1 Puff by inhalation every four (4) hours as needed for Wheezing. 10/04/16  Yes Lesle Chris Z., NP   dorzolamide (TRUSOPT) 2 % ophthalmic solution  08/19/17   Provider, Historical   hydrocortisone (CORTAID) 1 % topical cream Apply  to affected area two (2) times a day. use thin layer 01/25/17   Trula Ore., NP      Allergies   Allergen Reactions   ??? Lisinopril Rash     Timing of rash c/w start and stop of lisinopril. No angioedema       Patient Active Problem List   Diagnosis Code   ??? Sarcoidosis D86.9   ??? High cholesterol E78.00   ??? BMI 45.0-49.9, adult (HCC) Z68.42   ??? OSA (obstructive sleep apnea) G47.33   ??? Vocal cord polyps J38.1   ??? GERD (gastroesophageal reflux disease) K21.9   ??? Glaucoma H40.9   ??? 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 OSA (obstructive sleep apnea) 06/26/2016   ??? Vocal cord polyps 06/26/2016   ??? GERD (gastroesophageal reflux disease) 06/26/2016   ??? Glaucoma 06/26/2016   ??? Sarcoidosis 06/15/2016   ??? High cholesterol 06/15/2016   ??? BMI 45.0-49.9, adult ([facility]) 06/15/2016       ROS     L great toe pain: x few days, no injury. Pain w/ weight bearing. No warmth, redness, swelling. Since onset pain is improving.she has been exercising more and has been unable today d/t pain  She tried aleve which helped a little, she asks if she can  take tylenol      Objective:   Vital Signs: (As obtained by patient/caregiver at home)  There were no vitals taken for this visit.     [INSTRUCTIONS:  "'[x]' " Indicates a positive item  "'[]' " Indicates a negative item  -- DELETE ALL ITEMS NOT EXAMINED]    Constitutional: '[x]'  Appears well-developed and well-nourished '[x]'  No apparent distress      '[]'  Abnormal -     Mental status: '[x]'  Alert and awake  '[x]'  Oriented to person/place/time '[x]'  Able to follow commands    '[]'  Abnormal -     Eyes:   EOM    '[x]'   Normal    '[]'  Abnormal -   Sclera  '[x]'   Normal    '[]'  Abnormal -          Discharge '[x]'   None visible   '[]'  Abnormal -     HENT: '[x]'  Normocephalic, atraumatic  '[]'  Abnormal -   '[x]'  Mouth/Throat: Mucous membranes are moist    External Ears '[x]'  Normal  '[]'  Abnormal -    Neck: '[x]'  No visualized mass '[]'  Abnormal -      Pulmonary/Chest: '[x]'  Respiratory effort normal   '[x]'  No visualized signs of difficulty breathing or respiratory distress        '[]'  Abnormal -      Musculoskeletal:   '[x]'  Normal gait with no signs of ataxia         '[x]'  Normal range of motion of neck        '[]'  Abnormal -     Neurological:        '[x]'  No Facial Asymmetry (Cranial nerve 7 motor function) (limited exam due to video visit)          '[x]'  No gaze palsy        '[]'  Abnormal -          Skin:        '[x]'  No significant exanthematous lesions or discoloration noted on facial skin         '[]'  Abnormal -            Psychiatric:       '[x]'  Normal Affect '[]'  Abnormal -        '[x]'  No Hallucinations    Other pertinent observable physical exam findings:-        We discussed the expected course, resolution and complications of the diagnosis(es) in detail.  Medication risks, benefits, costs, interactions, and alternatives were discussed as indicated.  I advised her to contact the office if her condition worsens, changes or fails to improve as anticipated. She expressed understanding with the diagnosis(es) and plan.       Heather Sosa is a 53 y.o. female who was evaluated by a video visit encounter for concerns as above. Patient identification was verified prior to start of the visit. A caregiver was present when appropriate. Due to this being a Scientist, physiological (During ENMMH-68 public health emergency), evaluation of the following organ systems was limited: Vitals/Constitutional/EENT/Resp/CV/GI/GU/MS/Neuro/Skin/Heme-Lymph-Imm.  Pursuant to the emergency declaration under the [place], 1135 waiver authority and the [employer] Act, this Virtual  Visit was conducted, with patient's (and/or legal guardian's) consent, to reduce the patient's risk of exposure to COVID-19 and provide necessary medical care.     Services were provided through a video synchronous discussion virtually to  substitute for in-person clinic visit.   Patient and provider were located at their  individual homes.      Berdina Cheever Z. Vladimir Crofts, NP

## 2019-04-28 ENCOUNTER — Encounter

## 2019-04-28 MED ORDER — FLUCONAZOLE 150 MG TAB
150 mg | ORAL_TABLET | Freq: Every day | ORAL | 0 refills | Status: DC
Start: 2019-04-28 — End: 2019-04-28

## 2019-04-28 MED ORDER — FLUCONAZOLE 150 MG TAB
150 mg | ORAL_TABLET | Freq: Every day | ORAL | 0 refills | Status: AC
Start: 2019-04-28 — End: 2019-04-29

## 2019-04-28 NOTE — Telephone Encounter (Signed)
Informed pt of rx sent. Pt understood.

## 2019-04-28 NOTE — Telephone Encounter (Signed)
Pt states abx she received gave her a yeast infection x 3 weeks and would like a rx sent for this. Pt is using the [facility] in [place], [place] [address]

## 2019-04-28 NOTE — Telephone Encounter (Signed)
Pt wants a return call ref to reaction she had from medication (amoxicillin) that was prescribed to her for dental care.  She states it caused a problem and she did not want to give any further information.  Pt # [phone]

## 2019-07-31 NOTE — Telephone Encounter (Signed)
Informed pt she can try [facility] and gave number. Pt understood.

## 2019-07-31 NOTE — Telephone Encounter (Signed)
Pt is requesting referral for son Lyberti Thrush DOB 04/07/1994, states pt needs Behavioral Health help for cognitive decision making. Informed pt that Mr. Khouri hasn't been seen since 2019 and will need an appt, she states Mr. Baltes is currently incarcerated and goes to court Next Week and she is wanting to have help set up for when he comes home. She has tried a [facility] center in [place] but would prefer something closer, like [facility] if available.

## 2019-07-31 NOTE — Telephone Encounter (Signed)
Patient called because she wanted to speak with Alysha regarding a referral. She sis not want to go into detail because she wanted to give Alysha the details. The contact number is [phone].

## 2019-10-29 NOTE — Telephone Encounter (Signed)
Patient called because she had recently seen her pulmonologist and he called her and stated that her blood work was concerning and that she has low iron. He told the patient to reach out to [facility]. She wanted to speak to the nurse regarding this issue to let her know what to do. The contact number is [phone].

## 2019-10-30 NOTE — Telephone Encounter (Signed)
Informed pt of provider's response, pt understood. Pt is scheduled for Monday May 3rd at 3:45PM, pt states [facility] lab is 10.9 and she will be sending results via MyChart

## 2019-11-02 ENCOUNTER — Encounter: Attending: Family | Primary: Family

## 2019-11-05 ENCOUNTER — Encounter: Payer: MEDICAID | Attending: Family | Primary: Family

## 2019-12-01 ENCOUNTER — Ambulatory Visit: Admit: 2019-12-01 | Discharge: 2019-12-01 | Payer: MEDICAID | Attending: Family | Primary: Family

## 2019-12-01 ENCOUNTER — Ambulatory Visit: Attending: Family | Primary: Family

## 2019-12-01 DIAGNOSIS — I10 Essential (primary) hypertension: Secondary | ICD-10-CM

## 2019-12-01 NOTE — Progress Notes (Signed)
Chief Complaint   Patient presents with   . Follow-up     annual       1. Have you been to the ER, urgent care clinic since your last visit?  Hospitalized since your last visit?No    2. Have you seen or consulted any other health care providers outside of the [facility] since your last visit?  Include any pap smears or colon screening. No

## 2019-12-01 NOTE — Progress Notes (Signed)
Subjective: (As above and below)     Chief Complaint   Patient presents with   ??? Follow-up     annual     Heather Sosa is a 54 y.o. year old female who presents for     Hypertension ROS:  taking medications as instructed, no medication side effects noted, no TIAs, no chest pain on exertion, no dyspnea on exertion, no swelling of ankles    Wt Readings from Last 3 Encounters:   12/01/19 289 lb (131.1 kg)   05/02/18 289 lb (131.1 kg)   08/29/17 303 lb 4.8 oz (137.6 kg)         OSA: adherent w/ cpap    Sarcoidosis" followed by sarcoid clinic at [facility]    ICD in placefollowed by cardiology [facility]. 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 She has been taking the eliquis     Mammo:due, hesitant bc last one was painful due to ICD  Colonoscopy: cologuard previously ordered, she did not get and she has moved      Reviewed PmHx, RxHx, FmHx, SocHx, AllgHx and updated in chart.  Family History   Problem Relation Age of Onset   ??? Cancer Maternal Grandmother         unknown   ??? Cancer Paternal Grandmother         breast       Past Medical History:   Diagnosis Date   ??? Cardiac defibrillator in place 2018   ??? Costochondral chest pain    ??? High cholesterol    ??? Sarcoid       Social History     Socioeconomic History   ??? Marital status: DIVORCED     Spouse name: Not on file   ??? Number of children: Not on file   ??? Years of education: Not on file   ??? Highest education level: Not on file   Tobacco Use   ??? Smoking status: Never Smoker   ??? Smokeless tobacco: Never Used   Vaping Use   ??? Vaping Use: Never used   Substance and Sexual Activity   ??? Alcohol use: No   ??? Drug use: No   ??? Sexual activity: Never     Social Determinants of Health     Financial Resource Strain:    ??? Difficulty of Paying Living Expenses:    Food Insecurity:    ??? Worried About Programme researcher, broadcasting/film/video in the Last Year:    ??? Barista in the Last Year:    Transportation  Needs:    ??? Freight forwarder (Medical):    ??? Lack of Transportation (Non-Medical):    Physical Activity:    ??? Days of Exercise per Week:    ??? Minutes of Exercise per Session:    Stress:    ??? Feeling of Stress :    Social Connections:    ??? Frequency of Communication with Friends and Family:    ??? Frequency of Social Gatherings with Friends and Family:    ??? Attends Religious Services:    ??? Database administrator or Organizations:    ??? Attends Banker Meetings:    ??? Marital Status:           Current Outpatient Medications   Medication Sig   ??? ELIQUIS 5 mg tablet    ??? losartan (COZAAR) 25 mg tablet  Take 1 Tab by mouth daily.   ??? folic acid (FOLVITE) 1 mg tablet    ??? methotrexate (RHEUMATREX) 2.5 mg tablet    ??? SYMBICORT 80-4.5 mcg/actuation HFAA    ??? albuterol (PROAIR HFA) 90 mcg/actuation inhaler Take 1 Puff by inhalation every four (4) hours as needed for Wheezing.   ??? dorzolamide (TRUSOPT) 2 % ophthalmic solution    ??? hydrocortisone (CORTAID) 1 % topical cream Apply  to affected area two (2) times a day. use thin layer     No current facility-administered medications for this visit.       Review of Systems:   Constitutional:    Negative for fever and chills, negative diaphoresis.   HEENT:              Negative for neck pain and stiffness.  Eyes:                  Negative for visual disturbance, itching, redness or discharge.   Respiratory:        Negative for cough and shortness of breath.   Cardiovascular:  Negative for chest pain and palpitations.   Gastrointestinal: Negative for nausea, vomiting, abdominal pain, diarrhea or constipation.  Genitourinary:     Negative for dysuria and frequency.   Musculoskeletal: Negative for falls, tenderness and swelling.  Skin:                    Negative for rash, masses or lesions.   Neurological:       Negative for dizzyness, seizure, loss of consciousness, weakness and numbness.     Objective:     Vitals:    12/01/19 1455   BP: 121/83   Pulse: 76   Resp: 18    Temp: 98.1 ??F (36.7 ??C)   TempSrc: Temporal   SpO2: 96%   Weight: 289 lb (131.1 kg)   Height: 5\' 6"  (1.676 m)           Gen: Oriented to person, place and time and well-developed, well-nourished and in no distress.   HEENT:    Head: normocephalic and atraumatic.    Eyes:  EOM are normal. Pupils equal and round.    Neck:  Normal range of motion.  Neck supple.    Cardiovascular: normal rate, regular rhythm and normal heart sounds.   Pulmonary/Chest:  Effort normal and breath sounds normal.  No respiratory distress.  No wheezes, no rales.   Abdominal: soft, normal  bowel sounds.   Musculoskeletal:  No edema, no tenderness.  No calf tenderness or edema.   Neurological:  Alert, oriented to person, place and time.    Skin: skin is warm and dry.     .      Assessment/ Plan:       1. Essential hypertension    - CBC W/O DIFF; Future  - METABOLIC PANEL, COMPREHENSIVE; Future  - CBC W/O DIFF  - METABOLIC PANEL, COMPREHENSIVE    2. Sarcoidosis      3. OSA (obstructive sleep apnea)      4. Breast cancer screening by mammogram  Attempted to reassure pt  - MAM MAMMO BI SCREENING INCL CAD; Future    5. Mixed hyperlipidemia    - LIPID PANEL; Future      6. Elevated alkaline phosphatase level    - GGT; Future      7. Colon cancer screening    - COLOGUARD TEST (FECAL DNA COLORECTAL CANCER SCREENING)  I have discussed the diagnosis with the patient and the intended plan as seen in the above orders.  The patient has received an after-visit summary and questions were answered concerning future plans.  Pt conveyed understanding of plan.      Medication Side Effects and Warnings were discussed with patient: yes  Patient Labs were reviewed: yes  Patient Past Records were reviewed:  yes    Penelopi Mikrut Z. Vladimir Crofts, NP

## 2019-12-01 NOTE — Progress Notes (Signed)
Subjective: (As above and below)     Chief Complaint   Patient presents with   ??? Follow-up     annual     Heather Sosa is a 54 y.o. year old female who presents for     Hypertension ROS:  taking medications as instructed, no medication side effects noted, no TIAs, no chest pain on exertion, no dyspnea on exertion, no swelling of ankles    Wt Readings from Last 3 Encounters:   12/01/19 289 lb (131.1 kg)   05/02/18 289 lb (131.1 kg)   08/29/17 303 lb 4.8 oz (137.6 kg)         OSA: adherent w/ cpap    Sarcoidosis" followed by sarcoid clinic at [facility]    ICD in placefollowed by cardi[facility]. 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 She has been taking the eliquis     Mammo:due, hesitant bc last one was painful due to ICD  Colonoscopy: cologuard previously ordered, she did not get and she has moved      Reviewed PmHx, RxHx, FmHx, SocHx, AllgHx and updated in chart.  Family History   Problem Relation Age of Onset   ??? Cancer Maternal Grandmother         unknown   ??? Cancer Paternal Grandmother         breast       Past Medical History:   Diagnosis Date   ??? Cardiac defibrillator in place 2018   ??? Costochondral chest pain    ??? High cholesterol    ??? Sarcoid       Social History     Socioeconomic History   ??? Marital status: DIVORCED     Spouse name: Not on file   ??? Number of children: Not on file   ??? Years of education: Not on file   ??? Highest education level: Not on file   Tobacco Use   ??? Smoking status: Never Smoker   ??? Smokeless tobacco: Never Used   Vaping Use   ??? Vaping Use: Never used   Substance and Sexual Activity   ??? Alcohol use: No   ??? Drug use: No   ??? Sexual activity: Never     Social Determinants of Health     Financial Resource Strain:    ??? Difficulty of Paying Living Expenses:    Food Insecurity:    ??? Worried About Programme researcher, broadcasting/film/video in the Last Year:    ??? Barista in the Last Year:    Transportation  Needs:    ??? Freight forwarder (Medical):    ??? Lack of Transportation (Non-Medical):    Physical Activity:    ??? Days of Exercise per Week:    ??? Minutes of Exercise per Session:    Stress:    ??? Feeling of Stress :    Social Connections:    ??? Frequency of Communication with Friends and Family:    ??? Frequency of Social Gatherings with Friends and Family:    ??? Attends Religious Services:    ??? Database administrator or Organizations:    ??? Attends Banker Meetings:    ??? Marital Status:           Current Outpatient Medications   Medication Sig   ??? ELIQUIS 5 mg tablet    ??? losartan (COZAAR) 25 mg tablet  Take 1 Tab by mouth daily.   ??? folic acid (FOLVITE) 1 mg tablet    ??? methotrexate (RHEUMATREX) 2.5 mg tablet    ??? SYMBICORT 80-4.5 mcg/actuation HFAA    ??? albuterol (PROAIR HFA) 90 mcg/actuation inhaler Take 1 Puff by inhalation every four (4) hours as needed for Wheezing.   ??? dorzolamide (TRUSOPT) 2 % ophthalmic solution    ??? hydrocortisone (CORTAID) 1 % topical cream Apply  to affected area two (2) times a day. use thin layer     No current facility-administered medications for this visit.       Review of Systems:   Constitutional:    Negative for fever and chills, negative diaphoresis.   HEENT:              Negative for neck pain and stiffness.  Eyes:                  Negative for visual disturbance, itching, redness or discharge.   Respiratory:        Negative for cough and shortness of breath.   Cardiovascular:  Negative for chest pain and palpitations.   Gastrointestinal: Negative for nausea, vomiting, abdominal pain, diarrhea or constipation.  Genitourinary:     Negative for dysuria and frequency.   Musculoskeletal: Negative for falls, tenderness and swelling.  Skin:                    Negative for rash, masses or lesions.   Neurological:       Negative for dizzyness, seizure, loss of consciousness, weakness and numbness.     Objective:     Vitals:    12/01/19 1455   BP: 121/83   Pulse: 76   Resp: 18    Temp: 98.1 ??F (36.7 ??C)   TempSrc: Temporal   SpO2: 96%   Weight: 289 lb (131.1 kg)   Height: 5' 6" (1.676 m)           Gen: Oriented to person, place and time and well-developed, well-nourished and in no distress.   HEENT:    Head: normocephalic and atraumatic.    Eyes:  EOM are normal. Pupils equal and round.    Neck:  Normal range of motion.  Neck supple.    Cardiovascular: normal rate, regular rhythm and normal heart sounds.   Pulmonary/Chest:  Effort normal and breath sounds normal.  No respiratory distress.  No wheezes, no rales.   Abdominal: soft, normal  bowel sounds.   Musculoskeletal:  No edema, no tenderness.  No calf tenderness or edema.   Neurological:  Alert, oriented to person, place and time.    Skin: skin is warm and dry.     .      Assessment/ Plan:       1. Essential hypertension    - CBC W/O DIFF; Future  - METABOLIC PANEL, COMPREHENSIVE; Future  - CBC W/O DIFF  - METABOLIC PANEL, COMPREHENSIVE    2. Sarcoidosis      3. OSA (obstructive sleep apnea)      4. Breast cancer screening by mammogram  Attempted to reassure pt  - MAM MAMMO BI SCREENING INCL CAD; Future    5. Mixed hyperlipidemia    - LIPID PANEL; Future      6. Elevated alkaline phosphatase level    - GGT; Future      7. Colon cancer screening    - COLOGUARD TEST (FECAL DNA COLORECTAL CANCER SCREENING)          I have discussed the diagnosis with the patient and the intended plan as seen in the above orders.  The patient has received an after-visit summary and questions were answered concerning future plans.  Pt conveyed understanding of plan.      Medication Side Effects and Warnings were discussed with patient: yes  Patient Labs were reviewed: yes  Patient Past Records were reviewed:  yes    Penelopi Mikrut Z. Vladimir Crofts, NP

## 2019-12-01 NOTE — Progress Notes (Signed)
Chief Complaint   Patient presents with   ??? Follow-up     annual       1. Have you been to the ER, urgent care clinic since your last visit?  Hospitalized since your last visit?No    2. Have you seen or consulted any other health care providers outside of the [facility] since your last visit?  Include any pap smears or colon screening. No

## 2019-12-02 LAB — LIPID PANEL
Cholesterol, Total: 207 mg/dL — ABNORMAL HIGH (ref 100–199)
Cholesterol, total: 207 mg/dL — ABNORMAL HIGH (ref 100–199)
HDL Cholesterol: 47 mg/dL (ref >39)
HDL: 47 mg/dL (ref >39)
LDL Calculated: 148 mg/dL — ABNORMAL HIGH (ref 0–99)
LDL, calculated: 148 mg/dL — ABNORMAL HIGH (ref 0–99)
Triglyceride: 67 mg/dL (ref 0–149)
Triglycerides: 67 mg/dL (ref 0–149)
VLDL, calculated: 12 mg/dL (ref 5–40)
VLDL: 12 mg/dL (ref 5–40)

## 2019-12-02 LAB — COMPREHENSIVE METABOLIC PANEL
ALT: 84 IU/L — ABNORMAL HIGH (ref 0–32)
AST: 83 IU/L — ABNORMAL HIGH (ref 0–40)
Albumin/Globulin Ratio: 1 NA — ABNORMAL LOW (ref 1.2–2.2)
Albumin: 3.8 g/dL (ref 3.8–4.9)
Alkaline Phosphatase: 216 IU/L — ABNORMAL HIGH (ref 48–121)
BUN: 15 mg/dL (ref 6–24)
Bun/Cre Ratio: 15 NA (ref 9–23)
CO2: 24 mmol/L (ref 20–29)
Calcium: 9.1 mg/dL (ref 8.7–10.2)
Chloride: 106 mmol/L (ref 96–106)
Creatinine: 0.99 mg/dL (ref 0.57–1.00)
EGFR IF NonAfrican American: 66 mL/min/{1.73_m2} (ref >59)
GFR African American: 76 mL/min/{1.73_m2} (ref >59)
Globulin, Total: 4 g/dL (ref 1.5–4.5)
Glucose: 90 mg/dL (ref 65–99)
Potassium: 4.4 mmol/L (ref 3.5–5.2)
Sodium: 140 mmol/L (ref 134–144)
Total Bilirubin: 0.3 mg/dL (ref 0.0–1.2)
Total Protein: 7.8 g/dL (ref 6.0–8.5)

## 2019-12-02 LAB — CBC
Hematocrit: 35 % (ref 34.0–46.6)
Hemoglobin: 10.9 g/dL — ABNORMAL LOW (ref 11.1–15.9)
MCH: 26.3 pg — ABNORMAL LOW (ref 26.6–33.0)
MCHC: 31.1 g/dL — ABNORMAL LOW (ref 31.5–35.7)
MCV: 84 fL (ref 79–97)
Platelets: 240 10*3/uL (ref 150–450)
RBC: 4.15 x10E6/uL (ref 3.77–5.28)
RDW: 13.8 % (ref 11.7–15.4)
WBC: 4.9 10*3/uL (ref 3.4–10.8)

## 2019-12-02 LAB — GGT
GGT: 128 IU/L — ABNORMAL HIGH (ref 0–60)
GGT: 128 IU/L — ABNORMAL HIGH (ref 0–60)

## 2019-12-02 LAB — CVD REPORT

## 2019-12-02 LAB — METABOLIC PANEL, COMPREHENSIVE
A-G Ratio: 1 — ABNORMAL LOW (ref 1.2–2.2)
ALT (SGPT): 84 IU/L — ABNORMAL HIGH (ref 0–32)
AST (SGOT): 83 IU/L — ABNORMAL HIGH (ref 0–40)
Albumin: 3.8 g/dL (ref 3.8–4.9)
Alk. phosphatase: 216 IU/L — ABNORMAL HIGH (ref 48–121)
BUN/Creatinine ratio: 15 (ref 9–23)
BUN: 15 mg/dL (ref 6–24)
Bilirubin, total: 0.3 mg/dL (ref 0.0–1.2)
CO2: 24 mmol/L (ref 20–29)
Calcium: 9.1 mg/dL (ref 8.7–10.2)
Chloride: 106 mmol/L (ref 96–106)
Creatinine: 0.99 mg/dL (ref 0.57–1.00)
GFR est AA: 76 mL/min/{1.73_m2} (ref >59)
GFR est non-AA: 66 mL/min/{1.73_m2} (ref >59)
GLOBULIN, TOTAL: 4 g/dL (ref 1.5–4.5)
Glucose: 90 mg/dL (ref 65–99)
Potassium: 4.4 mmol/L (ref 3.5–5.2)
Protein, total: 7.8 g/dL (ref 6.0–8.5)
Sodium: 140 mmol/L (ref 134–144)

## 2019-12-02 LAB — CBC W/O DIFF
HCT: 35 % (ref 34.0–46.6)
HGB: 10.9 g/dL — ABNORMAL LOW (ref 11.1–15.9)
MCH: 26.3 pg — ABNORMAL LOW (ref 26.6–33.0)
MCHC: 31.1 g/dL — ABNORMAL LOW (ref 31.5–35.7)
MCV: 84 fL (ref 79–97)
PLATELET: 240 10*3/uL (ref 150–450)
RBC: 4.15 x10E6/uL (ref 3.77–5.28)
RDW: 13.8 % (ref 11.7–15.4)
WBC: 4.9 10*3/uL (ref 3.4–10.8)

## 2019-12-03 NOTE — Telephone Encounter (Signed)
ver dob  Reviewed labs  Elevated LFTs, she thinks she had an [place] at [facility]  Will check    Lipids high, her diet has been poor, she will work on this    She is on methotrexate

## 2019-12-03 NOTE — Telephone Encounter (Signed)
ver dob  Reviewed labs  Elevated LFTs, she thinks she had an US at [facility]  Will check    Lipids high, her diet has been poor, she will work on this    She is on methotrexate

## 2020-01-16 LAB — FECAL DNA COLORECTAL CANCER SCREENING (COLOGUARD): FIT-DNA (Cologuard): NEGATIVE

## 2020-02-24 NOTE — Telephone Encounter (Signed)
Patient called to get her colon results, she was told to call her PCP

## 2020-02-25 NOTE — Telephone Encounter (Signed)
Informed pt of provider's response, pt verbalized understanding

## 2020-03-08 ENCOUNTER — Encounter: Attending: Family | Primary: Family

## 2020-06-02 ENCOUNTER — Encounter: Attending: Family | Primary: Family

## 2020-07-07 NOTE — Telephone Encounter (Signed)
Informed pt we are not able to test for covid at this office and she can try UC or ED if symptoms are severe. Pt states she has been trying to get appts for testing but everywhere is booked d/t high demand for tests. Pt states she has mild sx- slight headache, loss of smell - and was told to rest, quarantine, and drink plenty of fluids.     Pt states she will go to ED only if symptoms become severe but she will like a letter for her rental office so they know she is in quarantine.     Pt would like to called if letter can be completed and letter is to be emailed to pt at [email or web address]

## 2020-07-07 NOTE — Telephone Encounter (Signed)
-----   Message from Christella Hartigan sent at 07/07/2020 12:16 PM EST -----  Subject: Message to Provider    QUESTIONS  Information for Provider? Pt wants to look into getting a covid test, she   has very mild symptoms. Would like a call back ASAP.  ---------------------------------------------------------------------------  --------------  CALL BACK INFO  What is the best way for the office to contact you? OK to leave message on   voicemail  Preferred Call Back Phone Number? [phone]  ---------------------------------------------------------------------------  --------------  SCRIPT ANSWERS  Relationship to Patient? Self

## 2020-07-08 NOTE — Telephone Encounter (Signed)
Informed pt provider is unable to do letter, pt verbalized understanding. Pt would like to see if Alysha can do letter, pt has to move out but cannot move while quarantined, pt has been given # to [facility] to try and get tested for covid. Pt has also informed Pulm, to see if they can do letter as well.

## 2020-07-28 ENCOUNTER — Encounter: Attending: Family | Primary: Family

## 2020-08-29 ENCOUNTER — Encounter: Payer: MEDICAID | Attending: Family | Primary: Family

## 2020-09-08 ENCOUNTER — Telehealth: Admit: 2020-09-08 | Discharge: 2020-09-08 | Payer: MEDICAID | Attending: Family | Primary: Family

## 2020-09-08 ENCOUNTER — Telehealth: Attending: Family | Primary: Family

## 2020-09-08 DIAGNOSIS — I10 Essential (primary) hypertension: Secondary | ICD-10-CM

## 2020-09-08 NOTE — Progress Notes (Signed)
Chief Complaint   Patient presents with   . Follow-up   . Other     doxy.me [phone]       1. "Have you been to the ER, urgent care clinic since your last visit?  Hospitalized since your last visit?" No    2. "Have you seen or consulted any other health care providers outside of the [facility] since your last visit?" No     3. For patients aged 54-75: Has the patient had a colonoscopy / FIT/ Cologuard? Yes - Care Gap present. Most recent result on file      If the patient is female:    4. For patients aged 37-74: Has the patient had a mammogram within the past 2 years? Yes - Care Gap present. Most recent result on file      5. For patients aged 21-65: Has the patient had a pap smear? Yes - Care Gap present. Most recent result on file

## 2020-09-08 NOTE — Progress Notes (Signed)
Progress Notes by Debroah Baller., NP at 09/08/20 1415                Author: Debroah Baller., NP  Service: --  Author Type: Nurse Practitioner       Filed: 09/09/20 1201  Encounter Date: 09/08/2020  Status: Signed          Editor: Debroah Baller., NP (Nurse Practitioner)               Heather Sosa is a 55 y.o.  female who was seen by synchronous (real-time) audio-video technology on 09/08/2020  for Follow-up and Other (doxy.me [phone])              Assessment & Plan:     Diagnoses and all orders for this visit:      1. Essential hypertension      2. Mixed hyperlipidemia      3. Sarcoid      4. Pacemaker      5. Screening mammogram for breast cancer   -     MAM MAMMO BI SCREENING INCL CAD; Future         The complexity of medical decision making for this visit is moderate       Fu for pap   Continue care w/ specialists           Subjective:             Prior to Admission medications             Medication  Sig  Start Date  End Date  Taking?  Authorizing Provider            albuterol (PROVENTIL HFA, VENTOLIN HFA, PROAIR HFA) 90 mcg/actuation inhaler  Take 2 Puffs by inhalation every four (4) hours as needed.  06/13/20  06/13/21  Yes  Provider, Historical     losartan (COZAAR) 25 mg tablet  Take 1 Tab by mouth daily.  05/02/18    Yes  Debroah Baller., NP     folic acid (FOLVITE) 1 mg tablet    08/19/17    Yes  Provider, Historical     methotrexate (RHEUMATREX) 2.5 mg tablet    08/12/17    Yes  Provider, Historical            SYMBICORT 80-4.5 mcg/actuation HFAA    01/23/17    Yes  Provider, Historical              ROS       Hypertension ROS:  taking medications as instructed, no medication side effects noted, no TIAs, no chest pain on exertion, no dyspnea on exertion,  no swelling of ankles   Last week at pulm BP was- good per pt   Had labs done as well thru [facility]      Pacemaker/ICD in place- follows w/ cardiology- sees next month    Was on eliquis but stopped due to bloody nose- was on  this for ?stroke prevention   Her current cardiologist is aware of this- okay to stop      Hyperlipidemia: labs recently?      Sarcoidosis: follows w/ pulm at [facility] ophthalmologist- vision good per pt         Mammo: due but last mammo was painful due to ICD, she will try to get done at a different location      Pap: due  Objective:     No flowsheet data found.       [INSTRUCTIONS:  "[x]  " Indicates a positive item  "[] " Indicates a  negative item  -- DELETE ALL ITEMS NOT EXAMINED]      Constitutional: [x]   Appears well-developed and well-nourished [x]  No apparent  distress       []   Abnormal -       Mental status: [x]  Alert and awake  [x]  Oriented to person/place/time [x]   Able to follow commands     []  Abnormal -       Eyes:   EOM    [x]   Normal    []  Abnormal -    Sclera  [x]    Normal    []  Abnormal -           Discharge [x]    None visible   []  Abnormal -       HENT: [x]  Normocephalic, atraumatic  []  Abnormal -    [x]  Mouth/Throat: Mucous membranes  are moist      External Ears [x]  Normal  []  Abnormal -      Neck: [x]  No visualized mass []  Abnormal -       Pulmonary/Chest: [x]  Respiratory effort normal   [x]  No visualized signs of difficulty breathing or respiratory distress         []   Abnormal -        Musculoskeletal:   [x]   Normal gait with no signs of ataxia          [x]   Normal range of motion of neck         []   Abnormal -       Neurological:        [x]   No Facial Asymmetry (Cranial nerve 7 motor function) (limited exam due to video visit)           [x]   No gaze palsy         []  Abnormal -            Skin:        [x]   No significant exanthematous lesions or discoloration noted on facial skin          []   Abnormal -              Psychiatric:       [x]  Normal Affect []  Abnormal -         [x]   No Hallucinations      Other pertinent observable physical exam findings:-            We discussed the expected course, resolution and complications of the diagnosis(es) in detail.  Medication  risks, benefits, costs, interactions, and alternatives were discussed as indicated.  I advised  her to contact the office if her condition worsens, changes or fails to improve as anticipated.  She expressed understanding with the diagnosis(es) and plan.          Heather Sosa, was evaluated through a synchronous (real-time) audio-video encounter. The patient (or guardian if applicable) is aware that this is a billable service, which includes applicable co-pays. Verbal consent to proceed has been obtained. The  visit was conducted pursuant to the emergency declaration under the and the , 1135 waiver authority and the and Act.  Patient identification was verified,  and a caregiver was present when appropriate. The patient was located at home in a state where the provider  was licensed to provide care.         Heather Sosa Z. Doreatha Massed, NP

## 2020-11-10 ENCOUNTER — Encounter: Attending: Family | Primary: Family

## 2020-12-22 ENCOUNTER — Encounter: Payer: MEDICAID | Attending: Family | Primary: Family

## 2020-12-26 NOTE — Telephone Encounter (Signed)
Telephone Encounter by Daralene Milch., LPN at 10/93/23 0932                Author: Daralene Milch., LPN  Service: --  Author Type: Licensed Nurse       Filed: 12/26/20 0932  Encounter Date: 12/26/2020  Status: Signed          Editor: Bosher, Barbaraann Barthel., LPN (Licensed Nurse)          From: Gertha CalkinRolland Bimler Z. Nakoneczny, NPSent: 12/26/2020  1:33 AM EDTSubject: HelloNP Alysha, may you give me a call concerning my cancelation  on June 23rd. Ms. Geneveive Furness, [phone] you!

## 2021-01-24 ENCOUNTER — Inpatient Hospital Stay: Admit: 2021-01-24 | Payer: MEDICAID | Attending: Family | Primary: Family

## 2021-01-24 DIAGNOSIS — Z1231 Encounter for screening mammogram for malignant neoplasm of breast: Secondary | ICD-10-CM

## 2021-02-13 ENCOUNTER — Inpatient Hospital Stay: Admit: 2021-02-14 | Payer: MEDICAID | Primary: Family

## 2021-02-13 ENCOUNTER — Ambulatory Visit: Admit: 2021-02-13 | Discharge: 2021-02-13 | Payer: MEDICAID | Attending: Family | Primary: Family

## 2021-02-13 ENCOUNTER — Ambulatory Visit: Attending: Family | Primary: Family

## 2021-02-13 DIAGNOSIS — Z124 Encounter for screening for malignant neoplasm of cervix: Secondary | ICD-10-CM

## 2021-02-13 NOTE — Progress Notes (Signed)
 Chief Complaint   Patient presents with    Gyn Exam       1. Have you been to the ER, urgent care clinic since your last visit?  Hospitalized since your last visit? No    2. Have you seen or consulted any other health care providers outside of the [facility] since your last visit? No     3. For patients aged 54-75: Has the patient had a colonoscopy / FIT/ Cologuard? Yes - no Care Gap present      If the patient is female:    4. For patients aged 51-74: Has the patient had a mammogram within the past 2 years? Yes - no Care Gap present      5. For patients aged 21-65: Has the patient had a pap smear? No

## 2021-02-13 NOTE — Progress Notes (Signed)
Subjective: (As above and below)     Chief Complaint   Patient presents with    Gyn Exam    Insect Bite     Pt sates she was stung by a wasp on Friday on face, has been rubbing alcohol on face 3 x a day      Heather Sosa is a 55 y.o. year old female who presents for     Pap smear: no pelvic pain, vaginal discharge or bleeding    ICD: follows w/ cardiology   Sarcoidosis; follows w/ pulm     Hypertension ROS:  taking medications as instructed, no medication side effects noted, no TIAs, no chest pain on exertion, no dyspnea on exertion, no swelling of ankles    OSA adherent w/ cpap    Wasp sting: to face on Friday, she has some residual face swelling to R cheek    Reviewed PmHx, RxHx, FmHx, SocHx, AllgHx and updated in chart.  Family History   Problem Relation Age of Onset    Cancer Maternal Grandmother         unknown    Breast Cancer Paternal Grandmother     Cancer Paternal Grandmother         breast    Breast Cancer Paternal Aunt        Past Medical History:   Diagnosis Date    Cardiac defibrillator in place 2018    Costochondral chest pain     High cholesterol     Menopause     Sarcoid       Social History     Socioeconomic History    Marital status: DIVORCED   Tobacco Use    Smoking status: Never    Smokeless tobacco: Never   Vaping Use    Vaping Use: Never used   Substance and Sexual Activity    Alcohol use: No    Drug use: No    Sexual activity: Never          Current Outpatient Medications   Medication Sig    albuterol (PROVENTIL HFA, VENTOLIN HFA, PROAIR HFA) 90 mcg/actuation inhaler Take 2 Puffs by inhalation every four (4) hours as needed.    losartan (COZAAR) 25 mg tablet Take 1 Tab by mouth daily.    folic acid (FOLVITE) 1 mg tablet     methotrexate (RHEUMATREX) 2.5 mg tablet     SYMBICORT 80-4.5 mcg/actuation HFAA      No current facility-administered medications for this visit.       Review of Systems:   Constitutional:    Negative for fever and chills, negative diaphoresis.   HEENT:               Negative for neck pain and stiffness.  Eyes:                  Negative for visual disturbance, itching, redness or discharge.   Respiratory:        Negative for cough and shortness of breath.   Cardiovascular:  Negative for chest pain and palpitations.   Gastrointestinal: Negative for nausea, vomiting, abdominal pain, diarrhea or constipation.  Genitourinary:     Negative for dysuria and frequency.   Musculoskeletal: Negative for falls, tenderness and swelling.  Skin:                    Negative for rash, masses or lesions.   Neurological:       Negative for dizzyness, seizure, loss of consciousness,  weakness and numbness.     Objective:     Vitals:    02/13/21 1516   BP: (!) 119/99   Pulse: 97   Resp: 18   Temp: 97.1 ??F (36.2 ??C)   TempSrc: Temporal   SpO2: 96%   Weight: 308 lb (139.7 kg)   Height: 5\' 6"  (1.676 m)           Physical Examination: General appearance - alert, well appearing, and in no distress  Mental status - alert, oriented to person, place, and time  Ears - bilateral TM's and external ear canals normal  Chest - clear to auscultation, no wheezes, rales or rhonchi, symmetric air entry  Heart - normal rate, regular rhythm, normal S1, S2, no murmurs, rubs, clicks or gallops  Pelvic - normal external genitalia, vulva, vagina, cervix, uterus and adnexa  Extremities - no pedal edema noted    R cheek: small point where sting was, no visible stinger, mild swelling but no cellulitis    Assessment/ Plan:       1. Cervical cancer screening    - PAP IG, APTIMA HPV AND RFX 16/18,45 ); Future    2. Sarcoid      3. Essential hypertension    - METABOLIC PANEL, COMPREHENSIVE  - LIPID PANEL    4. Mixed hyperlipidemia    - CBC W/O DIFF    5. Insect bite of other part of head, initial encounter  Benadryl prn        I have discussed the diagnosis with the patient and the intended plan as seen in the above orders.  The patient has received an after-visit summary and questions were answered concerning future plans.   Pt conveyed understanding of plan.      Medication Side Effects and Warnings were discussed with patient: yes  Patient Labs were reviewed: yes  Patient Past Records were reviewed:  yes    Heather Sosa Z. ([id number], NP

## 2021-02-22 LAB — COMPREHENSIVE METABOLIC PANEL
ALT: 29 IU/L (ref 0–32)
AST: 40 IU/L (ref 0–40)
Albumin/Globulin Ratio: 1 NA — ABNORMAL LOW (ref 1.2–2.2)
Albumin: 3.6 g/dL — ABNORMAL LOW (ref 3.8–4.9)
Alkaline Phosphatase: 135 IU/L — ABNORMAL HIGH (ref 44–121)
BUN: 10 mg/dL (ref 6–24)
Bun/Cre Ratio: 11 NA (ref 9–23)
CO2: 24 mmol/L (ref 20–29)
Calcium: 8.9 mg/dL (ref 8.7–10.2)
Chloride: 104 mmol/L (ref 96–106)
Creatinine: 0.9 mg/dL (ref 0.57–1.00)
Est, Glomerular Filtration Rate: 76 mL/min/{1.73_m2} (ref >59)
Globulin, Total: 3.7 g/dL (ref 1.5–4.5)
Glucose: 75 mg/dL (ref 65–99)
Potassium: 4.6 mmol/L (ref 3.5–5.2)
Sodium: 141 mmol/L (ref 134–144)
Total Bilirubin: 0.5 mg/dL (ref 0.0–1.2)
Total Protein: 7.3 g/dL (ref 6.0–8.5)

## 2021-02-22 LAB — LIPID PANEL
Cholesterol, Total: 194 mg/dL (ref 100–199)
Cholesterol, total: 194 mg/dL (ref 100–199)
HDL Cholesterol: 48 mg/dL (ref >39)
HDL: 48 mg/dL (ref >39)
LDL Calculated: 135 mg/dL — ABNORMAL HIGH (ref 0–99)
LDL, calculated: 135 mg/dL — ABNORMAL HIGH (ref 0–99)
Triglyceride: 61 mg/dL (ref 0–149)
Triglycerides: 61 mg/dL (ref 0–149)
VLDL, calculated: 11 mg/dL (ref 5–40)
VLDL: 11 mg/dL (ref 5–40)

## 2021-02-22 LAB — CBC
Hematocrit: 35.5 % (ref 34.0–46.6)
Hemoglobin: 11 g/dL — ABNORMAL LOW (ref 11.1–15.9)
MCH: 26.6 pg (ref 26.6–33.0)
MCHC: 31 g/dL — ABNORMAL LOW (ref 31.5–35.7)
MCV: 86 fL (ref 79–97)
Platelets: 257 10*3/uL (ref 150–450)
RBC: 4.14 x10E6/uL (ref 3.77–5.28)
RDW: 14.7 % (ref 11.7–15.4)
WBC: 4.7 10*3/uL (ref 3.4–10.8)

## 2021-02-22 LAB — CVD REPORT

## 2021-02-22 LAB — METABOLIC PANEL, COMPREHENSIVE
A-G Ratio: 1 — ABNORMAL LOW (ref 1.2–2.2)
ALT (SGPT): 29 IU/L (ref 0–32)
AST (SGOT): 40 IU/L (ref 0–40)
Albumin: 3.6 g/dL — ABNORMAL LOW (ref 3.8–4.9)
Alk. phosphatase: 135 IU/L — ABNORMAL HIGH (ref 44–121)
BUN/Creatinine ratio: 11 (ref 9–23)
BUN: 10 mg/dL (ref 6–24)
Bilirubin, total: 0.5 mg/dL (ref 0.0–1.2)
CO2: 24 mmol/L (ref 20–29)
Calcium: 8.9 mg/dL (ref 8.7–10.2)
Chloride: 104 mmol/L (ref 96–106)
Creatinine: 0.9 mg/dL (ref 0.57–1.00)
GLOBULIN, TOTAL: 3.7 g/dL (ref 1.5–4.5)
Glucose: 75 mg/dL (ref 65–99)
Potassium: 4.6 mmol/L (ref 3.5–5.2)
Protein, total: 7.3 g/dL (ref 6.0–8.5)
Sodium: 141 mmol/L (ref 134–144)
eGFR: 76 mL/min/{1.73_m2} (ref >59)

## 2021-02-22 LAB — CBC W/O DIFF
HCT: 35.5 % (ref 34.0–46.6)
HGB: 11 g/dL — ABNORMAL LOW (ref 11.1–15.9)
MCH: 26.6 pg (ref 26.6–33.0)
MCHC: 31 g/dL — ABNORMAL LOW (ref 31.5–35.7)
MCV: 86 fL (ref 79–97)
PLATELET: 257 10*3/uL (ref 150–450)
RBC: 4.14 x10E6/uL (ref 3.77–5.28)
RDW: 14.7 % (ref 11.7–15.4)
WBC: 4.7 10*3/uL (ref 3.4–10.8)

## 2021-02-27 ENCOUNTER — Encounter

## 2021-02-27 MED ORDER — ATORVASTATIN 20 MG TAB
20 mg | ORAL_TABLET | Freq: Every evening | ORAL | 0 refills | Status: AC
Start: 2021-02-27 — End: 2021-05-30

## 2021-02-28 NOTE — Telephone Encounter (Signed)
Explained LDL and ref range to pt and informed that # at bottom of lab letter is MRN and not SSN, pt verbalized understanding. No further information needed at this time.

## 2021-03-31 ENCOUNTER — Telehealth: Admit: 2021-03-31 | Discharge: 2021-03-31 | Payer: MEDICAID | Attending: Family | Primary: Family

## 2021-03-31 ENCOUNTER — Telehealth: Attending: Family | Primary: Family

## 2021-03-31 DIAGNOSIS — L309 Dermatitis, unspecified: Secondary | ICD-10-CM

## 2021-03-31 MED ORDER — TRIAMCINOLONE ACETONIDE 0.1 % OINTMENT
0.1 % | Freq: Two times a day (BID) | CUTANEOUS | 0 refills | Status: AC
Start: 2021-03-31 — End: ?
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
## 2021-03-31 NOTE — Progress Notes (Signed)
 Chief Complaint   Patient presents with    Rash     back of neck and L arm x 1-2 months, pt states rash itches in the morning        1. Have you been to the ER, urgent care clinic since your last visit?  Hospitalized since your last visit? No    2. Have you seen or consulted any other health care providers outside of the [facility] since your last visit? No     3. For patients aged 54-75: Has the patient had a colonoscopy / FIT/ Cologuard? Yes - no Care Gap present      If the patient is female:    4. For patients aged 51-74: Has the patient had a mammogram within the past 2 years? Yes - no Care Gap present      5. For patients aged 21-65: Has the patient had a pap smear? Yes - no Care Gap present

## 2021-05-30 ENCOUNTER — Encounter

## 2021-05-30 MED ORDER — ATORVASTATIN 20 MG TAB
20 mg | ORAL_TABLET | Freq: Every evening | ORAL | 0 refills | Status: AC
Start: 2021-05-30 — End: ?

## 2021-05-30 NOTE — Telephone Encounter (Signed)
Last visit 03/31/2021 Virtual visit NP Nakoneczny   Next appointment Nothing scheduled   Previous refill encounter(s)   02/27/2021 Lipitor #[id number]     For Pharmacy Admin Tracking Only  Intervention Detail: New Rx: 1, reason: Patient Preference  Time Spent (min): 5      Requested Prescriptions     Pending Prescriptions Disp Refills    atorvastatin (LIPITOR) 20 mg tablet 90 Tablet 0     Sig: Take 1 Tablet by mouth nightly.     ----- Message from Leroy Libman sent at 05/30/2021 12:58 PM EST -----  Subject: Refill Request    QUESTIONS  Name of Medication? atorvastatin (LIPITOR) 20 mg tablet  Patient-reported dosage and instructions? 20 mg tablet 1x nightly  How many days do you have left? 1  Preferred Pharmacy? [facility] [phone]  Pharmacy phone number (if available)? [phone]  ---------------------------------------------------------------------------  --------------  Cleotis Lema INFO  What is the best way for the office to contact you? OK to leave message on   voicemail  Preferred Call Back Phone Number? [phone]  ---------------------------------------------------------------------------  --------------  SCRIPT ANSWERS  Relationship to Patient? Self

## 2021-09-01 ENCOUNTER — Telehealth: Admit: 2021-09-01 | Discharge: 2021-09-01 | Payer: MEDICAID | Attending: Family | Primary: Family

## 2021-09-01 ENCOUNTER — Telehealth: Attending: Family | Primary: Family

## 2021-09-01 DIAGNOSIS — R058 Other specified cough: Secondary | ICD-10-CM

## 2021-09-01 MED ORDER — LORATADINE 10 MG TAB
10 mg | ORAL_TABLET | Freq: Every day | ORAL | 1 refills | Status: AC | PRN
Start: 2021-09-01 — End: ?
  Filled 2021-09-01: qty 30, 30d supply, fill #0

## 2021-09-01 NOTE — Progress Notes (Signed)
Heather Sosa is a 56 y.o. female who was seen by synchronous (real-time) audio-video technology on 09/01/2021 for Cough (Dry cough x 1 month, occ has pain in right side when coughing, was only using cough drops but stopped - told pulm and cardio about cough and they did chest XR but they said XR was stable ) and Labs (Pt states she is seeing liver specialist on the 13th d/t liver levels being high- pulm told her to stop atorvastatin in November )        Assessment & Plan:   Diagnoses and all orders for this visit:    1. Dry cough    2. Elevated LFTs    3. Sarcoid    4. Mild intermittent asthma without complication    5. Essential hypertension    Other orders  -     loratadine (Claritin) 10 mg tablet; Take 1 Tablet by mouth daily as needed for Allergies.    The complexity of medical decision making for this visit is moderate     Try claritin  humidifier for dry cough  Fu specialists  Avoid any tylenol    Subjective:       Prior to Admission medications    Medication Sig Start Date End Date Taking? Authorizing Provider   loratadine (Claritin) 10 mg tablet Take 1 Tablet by mouth daily as needed for Allergies. 09/01/21  Yes Amado Nash Z., NP   Eliquis 5 mg tablet  03/03/21  Yes Provider, Historical   losartan (COZAAR) 25 mg tablet Take 1 Tab by mouth daily. 05/02/18  Yes Amado Nash Z., NP   SYMBICORT 80-4.5 mcg/actuation HFAA  01/23/17  Yes Provider, Historical   atorvastatin (LIPITOR) 20 mg tablet Take 1 Tablet by mouth nightly.  Patient not taking: Reported on 09/01/2021 05/30/21   Debroah Baller., NP   triamcinolone acetonide (KENALOG) 0.1 % ointment Apply  to affected area two (2) times a day. use thin layer 03/31/21   Debroah Baller., NP   folic acid (FOLVITE) 1 mg tablet  08/19/17   Provider, Historical   methotrexate (RHEUMATREX) 2.5 mg tablet  08/12/17   Provider, Historical         ROS    Dry cough x 1.5 months-   Hx of sarcoid and asthma, has normal CXR thru pulm  Worse in the day and more noted  at home  No GERD  She does have a little post nasal drainage but no  No wheezing-   She has gas heat thru vents  No humidifier      Hypertension ROS:  taking medications as instructed, no medication side effects noted, no TIAs, no chest pain on exertion, no dyspnea on exertion, no swelling of ankles    Elevated LFTs- has been trending up- her cardio/pulm doctors stopped her statin in November, but levels are still up  No RUQ pain, jaundice, etoh  Did take tylenol but not regularly  No otc cough/cold meds  She has appt w/ hepatology at [facility] next week  Has not had hepatitis testing  She states an [place] was done- normal      Objective:   No flowsheet data found.     [INSTRUCTIONS:  "[x] " Indicates a positive item  "[] " Indicates a negative item  -- DELETE ALL ITEMS NOT EXAMINED]    Constitutional: [x]  Appears well-developed and well-nourished [x]  No apparent distress      []  Abnormal -     Mental status: [x]  Alert and awake  [  x] Oriented to person/place/time [x]  Able to follow commands    []  Abnormal -     Eyes:   EOM    [x]   Normal    []  Abnormal -   Sclera  [x]   Normal    []  Abnormal -          Discharge [x]   None visible   []  Abnormal -     HENT: [x]  Normocephalic, atraumatic  []  Abnormal -   [x]  Mouth/Throat: Mucous membranes are moist    External Ears [x]  Normal  []  Abnormal -    Neck: [x]  No visualized mass []  Abnormal -     Pulmonary/Chest: [x]  Respiratory effort normal   [x]  No visualized signs of difficulty breathing or respiratory distress        []  Abnormal -      Musculoskeletal:   [x]  Normal gait with no signs of ataxia         [x]  Normal range of motion of neck        []  Abnormal -     Neurological:        [x]  No Facial Asymmetry (Cranial nerve 7 motor function) (limited exam due to video visit)          [x]  No gaze palsy        []  Abnormal -          Skin:        [x]  No significant exanthematous lesions or discoloration noted on facial skin         []  Abnormal -            Psychiatric:       [x]  Normal  Affect []  Abnormal -        [x]  No Hallucinations    Other pertinent observable physical exam findings:-        We discussed the expected course, resolution and complications of the diagnosis(es) in detail.  Medication risks, benefits, costs, interactions, and alternatives were discussed as indicated.  I advised her to contact the office if her condition worsens, changes or fails to improve as anticipated. She expressed understanding with the diagnosis(es) and plan.       Heather Sosa, was evaluated through a synchronous (real-time) audio-video encounter. The patient (or guardian if applicable) is aware that this is a billable service, which includes applicable co-pays. This Virtual Visit was conducted with patient's (and/or legal guardian's) consent. The visit was conducted pursuant to the emergency declaration under the and the , 1135 waiver authority and the and Act.  Patient identification was verified, and a caregiver was present when appropriate.  The patient was located at: Home: [address]  [place]   The provider was located at: Home:        Jesaiah Fabiano Z. , NP

## 2021-09-01 NOTE — Progress Notes (Signed)
 Chief Complaint   Patient presents with    Cough     Dry cough x 1 month, occ has pain in right side when coughing, was only using cough drops but stopped - told pulm and cardio about cough and they did chest XR but they said XR was stable     Labs     Pt states she is seeing liver specialist on the 13th d/t liver levels being high- pulm told her to stop atorvastatin in November        1. Have you been to the ER, urgent care clinic since your last visit?  Hospitalized since your last visit? No    2. Have you seen or consulted any other health care providers outside of the [facility] since your last visit? No     3. For patients aged 74-75: Has the patient had a colonoscopy / FIT/ Cologuard? Yes - no Care Gap present      If the patient is female:    4. For patients aged 56-74: Has the patient had a mammogram within the past 2 years? Yes - no Care Gap present      5. For patients aged 21-65: Has the patient had a pap smear? Yes - no Care Gap present

## 2022-02-26 ENCOUNTER — Ambulatory Visit: Admit: 2022-02-26 | Discharge: 2022-02-26 | Payer: PRIVATE HEALTH INSURANCE | Attending: Family | Primary: Family

## 2022-02-26 DIAGNOSIS — Z1231 Encounter for screening mammogram for malignant neoplasm of breast: Secondary | ICD-10-CM

## 2022-02-26 NOTE — Progress Notes (Signed)
Subjective: (As above and below)     Chief Complaint   Patient presents with    Annual Exam     Heather Sosa is a 56 y.o. year old female who presents for fu    Hypertension ROS:  taking medications as instructed, no medication side effects noted, no TIAs, no chest pain on exertion, no dyspnea on exertion, no swelling of ankles      ICD  Afib; on eliquis, follows w cardiology    Asthma: stable  Glaucoma: follows w ophthalmology at [facility], doing well    Mammo; due  Colon ca screening      Sarcoidosis follows w pulm, on cellcept    Wt Readings from Last 3 Encounters:   02/26/22 253 lb 14.4 oz (115.2 kg)   02/13/21 (!) 308 lb (139.7 kg)   12/01/19 289 lb (131.1 kg)     BP Readings from Last 3 Encounters:   02/26/22 130/84   02/13/21 123/81   12/01/19 121/83       Elevated lfts: statin, turmeric supplement being held, pulm ordered fu labs and pt has seen hepatology at [facility], encouraged to fu, per pulm notes pt declines MRI wu at this time, will trend labs      Reviewed PmHx, RxHx, FmHx, SocHx, AllgHx and updated in chart.  Family History   Problem Relation Age of Onset    Cancer Maternal Grandmother         unknown    Breast Cancer Paternal Grandmother     Cancer Paternal Grandmother         breast    Breast Cancer Paternal Aunt        Past Medical History:   Diagnosis Date    Cardiac defibrillator in place 2018    Costochondral chest pain     High cholesterol     Menopause     Sarcoid       Social History     Socioeconomic History    Marital status: Divorced     Spouse name: None    Number of children: None    Years of education: None    Highest education level: None   Tobacco Use    Smoking status: Never    Smokeless tobacco: Never   Substance and Sexual Activity    Alcohol use: No    Drug use: No          Current Outpatient Medications   Medication Sig    mycophenolate (CELLCEPT) 500 MG tablet Take 1 tablet by mouth 2 times daily    mycophenolate (CELLCEPT) 500 MG tablet Take 1 tablet by mouth 2 times daily    apixaban  (ELIQUIS) 5 MG TABS tablet ceived the following from [facility]: Outside name: Eliquis 5 mg tablet    budesonide-formoterol (SYMBICORT) 80-4.5 MCG/ACT AERO ceived the following from [facility]: Outside name: SYMBICORT 80-4.5 mcg/actuation HFAA    losartan (COZAAR) 25 MG tablet Take 1 tablet by mouth daily    atorvastatin (LIPITOR) 20 MG tablet Take 1 tablet by mouth nightly (Patient not taking: Reported on 02/26/2022)    folic acid (FOLVITE) 1 MG tablet ceived the following from [facility]: Outside name: folic acid (FOLVITE) 1 mg tablet (Patient not taking: Reported on 02/26/2022)    loratadine (CLARITIN) 10 MG tablet Take 10 mg by mouth daily as needed (Patient not taking: Reported on 02/26/2022)    methotrexate (RHEUMATREX) 2.5 MG chemo tablet ceived  the following from [facility]: Outside name: methotrexate (RHEUMATREX) 2.5 mg tablet (Patient not taking: Reported on 02/26/2022)    triamcinolone (KENALOG) 0.1 % ointment Apply topically 2 times daily (Patient not taking: Reported on 02/26/2022)     No current facility-administered medications for this visit.       Review of Systems:   Constitutional:    Negative for fever and chills, negative diaphoresis.   HEENT:              Negative for neck pain and stiffness.  Eyes:                  Negative for visual disturbance, itching, redness or discharge.   Respiratory:        Negative for cough and shortness of breath.   Cardiovascular:  Negative for chest pain and palpitations.   Gastrointestinal: Negative for nausea, vomiting, abdominal pain, diarrhea or constipation.  Genitourinary:     Negative for dysuria and frequency.   Musculoskeletal: Negative for falls, tenderness and swelling.  Skin:                    Negative for rash, masses or lesions.   Neurological:       Negative for dizzyness, seizure, loss of consciousness, weakness and numbness.     Objective:     Vitals:    02/26/22 1525 02/26/22 1528    BP: (!) 128/90 130/84   Site: Left Upper Arm Left Upper Arm   Position: Sitting Sitting   Pulse: (!) 105    Resp: 16    Temp: 97.5 F (36.4 C)    TempSrc: Oral    SpO2: 98%    Weight: 253 lb 14.4 oz (115.2 kg)    Height: 5\' 6"  (1.676 m)            Gen: Oriented to person, place and time and well-developed, well-nourished and in no distress.   HEENT:    Head: normocephalic and atraumatic.    Eyes:  EOM are normal. Pupils equal and round.    Neck:  Normal range of motion.  Neck supple.    Cardiovascular: normal rate, regular rhythm and normal heart sounds.   Pulmonary/Chest:  Effort normal and breath sounds normal.  No respiratory distress.  No wheezes, no rales.   Abdominal: soft, normal  bowel sounds.   Musculoskeletal:  No edema, no tenderness.  No calf tenderness or edema.   Neurological:  Alert, oriented to person, place and time.    Skin: skin is warm and dry.       Assessment/ Plan:       1. Screening mammogram for breast cancer    - MAM DIGITAL SCREEN W OR WO CAD BILATERAL; Future    2. Sarcoidosis      3. Mild intermittent asthma without complication      4. ICD (implantable cardioverter-defibrillator) in place      5. Mixed hyperlipidemia    - Lipid Panel; Future    6. Paroxysmal atrial fibrillation (HCC)      7. Elevated LFTs              I have discussed the diagnosis with the patient and the intended plan as seen in the above orders.  The patient has received an after-visit summary and questions were answered concerning future plans.  Pt conveyed understanding of plan.      Medication Side Effects and Warnings were discussed with patient:  Yes  Patient Labs were reviewed: Yes  Patient Past Records were reviewed:  Yes    Amado Nash, APRN - NP

## 2022-02-26 NOTE — Progress Notes (Signed)
Chief Complaint   Patient presents with    Annual Exam       "Have you been to the ER, urgent care clinic since your last visit?  Hospitalized since your last visit?"    NO    "Have you seen or consulted any other health care providers outside of [facility] since your last visit?"    Yes [facility] for Sarcadosis        BP 130/84 (Site: Left Upper Arm, Position: Sitting)   Pulse (!) 105   Temp 97.5 F (36.4 C) (Oral)   Resp 16   Ht 5\' 6"  (1.676 m)   Wt 253 lb 14.4 oz (115.2 kg)   SpO2 98%   BMI 40.98 kg/m

## 2022-03-01 ENCOUNTER — Encounter

## 2022-03-02 LAB — LIPID PANEL
Chol/HDL Ratio: 5.7 — ABNORMAL HIGH (ref 0.0–5.0)
Cholesterol, Total: 226 MG/DL — ABNORMAL HIGH (ref <200)
HDL: 40 MG/DL
LDL Calculated: 171.2 MG/DL — ABNORMAL HIGH (ref 0–100)
Triglycerides: 74 MG/DL (ref <150)
VLDL Cholesterol Calculated: 14.8 MG/DL

## 2022-03-09 NOTE — Telephone Encounter (Signed)
Spoke w pt  She is still holding statin due to elevated lfts  Pt already had normal liver US  Lipids are back up..  Will reach out to her cardiologist to see what they recc in terms of treatment as hep recc cont hold statin per pt      Lmov w front desk regarding this w Dr. Sharyon Medicus office

## 2022-03-12 NOTE — Telephone Encounter (Signed)
Amy @ [facility] called to give update on this patient. They are referring her  to the liver  sarcoid clinic for her liver enzymes.  Amy'# # [phone] [id number]

## 2022-03-12 NOTE — Telephone Encounter (Signed)
Formatting of this note might be different from the original.  Left message updating NP Doreatha Massed pt is referred to the liver sarcoid clinic.  Electronically signed by Dalene Carrow, RN at 03/12/2022  1:13 PM EDT

## 2022-03-13 NOTE — Telephone Encounter (Signed)
Formatting of this note might be different from the original.  Returned call- LVM for callback.    Dr Larna Daughters- her PCP Amado Nash is concerned about the high cholesterol. She is already established with the liver sarcoid clinic, and you recommended on a prior message that she not be started on any cholesterol meds while her liver values were elevated. Does this warrant a sooner appt with the liver sarcoid clinic? Currently does not have anything  until next March 2024 for a return fibroscan appt- no clinic appt is scheduled with Dr Allena Katz.     Thanks!  Technical sales engineer signed by Derryl Harbor, RN at 03/13/2022  2:40 PM EDT

## 2022-03-13 NOTE — Telephone Encounter (Signed)
Left message w clinic, trying to see what they want to do about her statin? High cholesterol

## 2022-04-18 ENCOUNTER — Ambulatory Visit: Payer: PRIVATE HEALTH INSURANCE | Primary: Family

## 2022-05-09 ENCOUNTER — Inpatient Hospital Stay: Admit: 2022-05-09 | Payer: PRIVATE HEALTH INSURANCE | Primary: Family

## 2022-05-09 DIAGNOSIS — Z1231 Encounter for screening mammogram for malignant neoplasm of breast: Secondary | ICD-10-CM

## 2022-05-30 NOTE — Telephone Encounter (Signed)
Formatting of this note might be different from the original.  Presenting looks like ST with lots of ectopy - 05/29/22 at 03:11. Call and check in on - see if she can send another transmission.     Transmission uploaded to media.   Electronically signed by Dorris Fetch, RN at 05/30/2022 12:19 PM EST

## 2022-05-31 NOTE — Telephone Encounter (Signed)
Formatting of this note might be different from the original.  Images from the original note were not included.  Dr. Fredderick Severance,    Carlyss - 05/29/22 transmission, presenting tachy with lots of ectopy. Patient asymptomatic. Had her send another transmission, presenting normal, slightly tachy 108bpm. No BB or AADs.     We will continue to monitor unless you have recommendations.     Thank you.     Transmissions uploaded to media     05/29/22 presenting:    Electronically signed by Dorris Fetch, RN at 05/31/2022  1:49 PM EST

## 2022-06-07 NOTE — Telephone Encounter (Signed)
Formatting of this note might be different from the original.  Nebulizer order sent through [facility]. Will scan order in media.  Electronically signed by Verdia Kuba, RN at 06/07/2022  4:14 PM EST

## 2022-06-07 NOTE — Progress Notes (Signed)
Formatting of this note is different from the original.  Images from the original note were not included.  IM - Pulmonary Clinic Subsequent Visit    Impression & Plan    1. Sarcoidosis. Multisystem involvement (Lung, Cardiac, Hepatic, Ocular)  Some increase in dyspnea    She opted not to pursue MRI as part of work up of liver involvement -> she has an upcoming appointment in Hepatology clinic with Dr. Allena Katz    - continue mycophenolate   - standing cbc, cmp order entered for toxicity monitoring    - CMP will hopefully inform hepatic disease activity    - check PFT's at next visit    2. Cardiac sarcoidosis  Cellcept as above    - Repeat TTE to eval for cardiac contribution to recent dyspnea    3. Mild intermittent asthma without complication  Increase in symptoms - suspect secondary to asthma rather than Sarcoidosis    - continue Symbicort    - cont prn albuterol    - prednisone burst    4. Long term monitoring of toxic medication - mycophenolate - following CBC, CMP - she will go to the lab today    RTC 3 months    History of Present Illness:     Having more episodic dyspnea - often difficult to lay down but has some good days when she is asymptomatic. Has also noticed associated wheezing.      Using albuterol and symbicort as prescribed.     No LE edema.  Weight is stable.  Some nights she feels she needs to sleep sitting up.     No fevers, chills, night sweats.     No events with ICD.     +++++++++++++++++++++++++++++++++++++++++++++  SARCOIDOSIS DIAGNOSIS: 2000  Presenting ME:QASTMH  DQ:QIWLNLGXQJJH in 2000, EBUS 2015 ->granulomatous inflammation  Organs involved: lung, ocular  RX hx:     Prednisone intermittently since diagnosis    methotrexate 12/2016 -> December 2022      PFT:    10/29/08:fev1: 2.24 (77%)fvc: 2.78tlc 3.72dlco 61% No bronchodilator response  10/11/12fev1: 2.2 (77%)fvc:  ?tlc: 4.41 (81%)    08/2016: fev1: 1.47 (61%)fvc: 2.20tlc:3.54 (78%)dlco: 17.51 (71%)      07/2017:          Component      Latest Ref Rng & Units 07/24/2018 09/01/2020 08/31/2021   FVC Actual Pre Bronch      L 2.47 2.49 2.21   FVC %Pred Pre Bronch      % 84 86 77   FVC Actual Post Bronch      L  2.33 2.23   FVC %Pred Post Bronch      %   77   FVC (L) % Change Calculation      %  -6 0   FEV1 Actual Pre Bronch      L 1.79 2.01 1.69   FEV1 %Pred Pre Bronch      % 76 87 74   FEV1 Actual Post Bronch      L  1.91 1.68   FEV1 %Pred Post Bronch      %   73   FEV1 (L) % Change Calculation      %  -5 0   FEV1FVC-Pre      %  81 77   SVC Actual Pre Bronch      L 2.38 2.40 2.16   SVC %Pred Pre Bronch      % 84 86 78   IC Actual  Pre Bronch      L 1.70 1.85 1.55   IC %Pred Pre Bronch      % 92 100 84   ERV Actual Pre Bronch      L 0.68 0.55 0.60   ERV %Pred Pre Bronch      % 70 58 65   TGV ([place]) Actual Pre Bronch      L  2.10 2.36   TGV ([place]) %Pred Pre Bronch      %  78 88   RV ([place]) Actual Pre Bronch      L 1.54  1.76   RV ([place]th) %Pred Pre Bronch      % 90  100   TLC ([place]) Actual Pre Bronch      L 3.92  3.92   TLC ([place]) %Pred Pre Bronch      % 86  86   DLCO Unadjusted Actual Pre Bronch      ml/min/mmHg 14.98 11.68 16.58   DLCO Unadjusted %Pred Pre Bronch      % 60 46 66   KCO      ml/min/mmHg/L 5.03 4.16 5.52       Chest CT:      PET:     Echo:    07/17/14- EF 50%, hypokinesis of basal-midinferior myocardium, mild/mod MR, mild LA dilation  08/2016: LV with hypokinesis, cleft in the apicoseptal area, appearing to reach epicardium, adjacent apical myocardium is thicker and hypokinetic - >concerning for cardiac sarcoidosis.        08/2017:    Interpretation Summary  Technically suboptimal study quality may adversely affect interpretation.  Left ventricular systolic function is borderline reduced.  LV ejection fraction = 55%.  Mild mid to distal  inferolateral hypokinesis.  A left ventricular focal outpouching is noted is noted in the distal septal /  apical wall.  There appears to be endothelial continuity arguing against a pseudoaneurysm  however the outpouching function is depressed supporting a ventricular  aneurysm (and not a diverticulum).  The right ventricle is mildly dilated.  The right ventricular systolic function is borderline reduced.  There is mild mitral regurgitation.        07/2016 ->  Diagnosis Normal sinus rhythm   Voltage criteria for left ventricular hypertrophy  Nonspecific ST-T wave changes      OPTHO:05/2021  ++++++++++++++++++++++++++++++++++++  History    Diagnosis Date   ? Glaucoma    ? Sarcoidosis      Family History   Problem Relation Name Age of Onset   ? No Known Problems Mother     ? No Known Problems Father       Past Surgical History:   Procedure Laterality Date   ? OTHER SURGICAL HISTORY  05/05/2018    Myocardial perfusion   ? OTHER SURGICAL HISTORY  12/28/2016    Defibrillation using automated external cardiac defibrillator     Social History     Tobacco Use   ? Smoking status: Former   ? Smokeless tobacco: Never   Substance Use Topics   ? Alcohol use: Never     Allergies/Current Medications  Allergies   Allergen Reactions   ? Lisinopril Rash     Reaction Type: Allergy  Timing of rash c/w start and stop of lisinopril. No angioedema     Current Outpatient Medications   Medication Sig Dispense Refill   ? albuterol (ProAir HFA) 108 (90 Base) MCG/ACT inhaler Inhale 2 puffs every 4 hours as needed for wheezing or shortness  of breath. 8.5 g 11   ? apixaban (Eliquis) 5 MG tablet Take 1 tablet by mouth 2 times daily. (Patient not taking: Reported on 02/13/2022) 180 tablet 3   ? apixaban (Eliquis) 5 MG tablet Take 1 tablet by mouth 2 times daily. 180 tablet 3   ? azelastine (Astelin) 0.1 % nasal spray Administer 1 spray into each nostril 2 times daily. Use in each nostril as directed 30 mL 12   ? budesonide-formoterol  (Symbicort) 80-4.5 MCG/ACT inhaler Inhale 2 puffs 2 times daily. 1 each 11   ? fluticasone (Flonase) 50 MCG/ACT nasal spray Administer 2 sprays into each nostril daily. Shake gently. Before first use, prime pump. After use, clean tip and replace cap. 16 g 11   ? folic acid (Folvite) 1 MG tablet Take 2 tablets by mouth daily. 30 tablet 11   ? loratadine (Claritin) 10 MG tablet Take 1 tablet by mouth daily. (Patient not taking: Reported on 02/13/2022) 30 tablet 11   ? loratadine (Claritin) 10 MG tablet Take 10 mg by mouth.     ? losartan (Cozaar) 25 MG tablet Take 1 tablet by mouth daily. 90 tablet 3   ? mycophenolate (Cellcept) 500 MG tablet TAKE ONE TABLET BY MOUTH TWICE A DAY 60 tablet 3   ? triamcinolone (Kenalog) 0.1 % ointment Apply topically 2 times daily.       No current facility-administered medications for this visit.     Physical Exam  Visit Vitals  Smoking Status Former     Physical Exam   Constitutional: She appears healthy. No distress.   Cardiovascular: Regular rhythm.   Pulmonary/Chest: Effort normal and breath sounds normal.       Musculoskeletal:         General: No edema.   Neurological: She is alert and oriented to person, place, and time.   Skin: No cyanosis. Nails show no clubbing.     Review/Management      Electronically signed by Mariann Barter, MD at 06/07/2022  2:50 PM EST

## 2022-06-18 NOTE — Telephone Encounter (Signed)
Formatting of this note might be different from the original.  Please see below update.     Called patient to follow up on message from front desk staff, patient stated she has update for Dr. Nyoka Cowden but was having difficulty sending it through [facility].     Stated provider prescribed prednisone for 5 days and she completed it last Tuesday.  She said," my wheezing and all the symptoms I was having when I came to see him has calmed down and I am doing a lot better". Also said she received her nebulizer machine and has been doing her nebs as ordered.     She stated she has some standing labs that she will  do on Friday.     Thank you.  Electronically signed by Verdia Kuba, RN at 06/18/2022  2:16 PM EST

## 2022-06-18 NOTE — Telephone Encounter (Signed)
Formatting of this note might be different from the original.  ----- Message from Paul Half sent at 06/18/2022  1:48 PM EST -----  Regarding: Message from pt  Hello clinical staff,    Ms. Nay would like a call back from a nurse in regards to getting a message to Dr. Nyoka Cowden.  She has been trying to get it through the portal and no success. Phone number to be reached on is [phone].    Thank you    Electronically signed by Verdia Kuba, RN at 06/18/2022  2:09 PM EST

## 2022-08-29 ENCOUNTER — Ambulatory Visit: Admit: 2022-08-29 | Discharge: 2022-08-29 | Payer: PRIVATE HEALTH INSURANCE | Attending: Family | Primary: Family

## 2022-08-29 DIAGNOSIS — J4520 Mild intermittent asthma, uncomplicated: Secondary | ICD-10-CM

## 2022-08-29 NOTE — Progress Notes (Signed)
Subjective: (As above and below)     Chief Complaint   Patient presents with    Follow-up     6 month     Heather Sosa is a 57 y.o. year old female who presents for       Hypertension ROS:  taking medications as instructed, no medication side effects noted, no TIAs, no chest pain on exertion, no dyspnea on exertion, no swelling of ankles      Sarcoid follows w rheum    Elevated LFT follows w hepatology    Hypertension ROS:  taking medications as instructed, no medication side effects noted, no TIAs, no chest pain on exertion, no dyspnea on exertion, no swelling of ankles    BP Readings from Last 3 Encounters:   08/29/22 114/79   02/26/22 130/84   02/13/21 123/81       Wt Readings from Last 3 Encounters:   08/29/22 115.6 kg (254 lb 14.4 oz)   05/09/22 114.8 kg (253 lb)   02/26/22 115.2 kg (253 lb 14.4 oz)     ICD: follows w cardiology; denies cp, sob, leg swelling  OsA: sleep med, intermittent use...  Afib: cardiology, on eliquis    Reviewed PmHx, RxHx, FmHx, SocHx, AllgHx and updated in chart.  Family History   Problem Relation Age of Onset    Cancer Maternal Grandmother         unknown    Breast Cancer Paternal Grandmother     Cancer Paternal Grandmother         breast    Breast Cancer Paternal Aunt     Cervical Cancer Niece        Past Medical History:   Diagnosis Date    Cardiac defibrillator in place 2018    Costochondral chest pain     High cholesterol     Menopause     Sarcoid       Social History     Socioeconomic History    Marital status: Divorced     Spouse name: None    Number of children: None    Years of education: None    Highest education level: None   Tobacco Use    Smoking status: Never    Smokeless tobacco: Never   Substance and Sexual Activity    Alcohol use: No    Drug use: No     Social Determinants of Health     Financial Resource Strain: Low Risk  (08/29/2022)    Overall Financial Resource Strain (CARDIA)     Difficulty of Paying Living Expenses: Not hard at all   Food Insecurity: No Food  Insecurity (08/29/2022)    Hunger Vital Sign     Worried About Running Out of Food in the Last Year: Never true     [place] in the Last Year: Never true   Transportation Needs: Unknown (08/29/2022)    PRAPARE - Transportation     Lack of Transportation (Non-Medical): No   Housing Stability: Unknown (08/29/2022)    Housing Stability Vital Sign     Unstable Housing in the Last Year: No          Current Outpatient Medications   Medication Sig    albuterol (PROVENTIL) (2.5 MG/3ML) 0.083% nebulizer solution Take 3 mLs by nebulization every 6 hours as needed    mycophenolate (CELLCEPT) 500 MG tablet Take 1 tablet by mouth 2 times daily    apixaban (ELIQUIS) 5 MG TABS tablet ceived the following from  [facility]: Outside name: Eliquis 5 mg tablet    budesonide-formoterol (SYMBICORT) 80-4.5 MCG/ACT AERO ceived the following from [facility]: Outside name: SYMBICORT 80-4.5 mcg/actuation HFAA    losartan (COZAAR) 25 MG tablet Take 1 tablet by mouth daily     No current facility-administered medications for this visit.       Review of Systems:   Constitutional:    Negative for fever and chills, negative diaphoresis.   HEENT:              Negative for neck pain and stiffness.  Eyes:                  Negative for visual disturbance, itching, redness or discharge.   Respiratory:        Negative for cough and shortness of breath.   Cardiovascular:  Negative for chest pain and palpitations.   Gastrointestinal: Negative for nausea, vomiting, abdominal pain, diarrhea or constipation.  Genitourinary:     Negative for dysuria and frequency.   Musculoskeletal: Negative for falls, tenderness and swelling.  Skin:                    Negative for rash, masses or lesions.   Neurological:       Negative for dizzyness, seizure, loss of consciousness, weakness and numbness.     Objective:     Vitals:    08/29/22 1535   BP: 114/79   Site: Left Upper Arm   Position: Sitting   Cuff Size: Large Adult   Pulse:  (!) 115   Resp: 18   Temp: 98.1 F (36.7 C)   TempSrc: Temporal   SpO2: 98%   Weight: 115.6 kg (254 lb 14.4 oz)   Height: 1.524 m (5')       No results found for this visit on 08/29/22.    Gen: Oriented to person, place and time and well-developed, well-nourished and in no distress.   HEENT:    Head: normocephalic and atraumatic.    Eyes:  EOM are normal. Pupils equal and round.    Neck:  Normal range of motion.  Neck supple.    Cardiovascular: normal rate, regular rhythm and normal heart sounds.   Pulmonary/Chest:  Effort normal and breath sounds normal.  No respiratory distress.  No wheezes, no rales.   Abdominal: soft, normal  bowel sounds.   Musculoskeletal:  No edema, no tenderness.  No calf tenderness or edema.   Neurological:  Alert, oriented to person, place and time.    Skin: skin is warm and dry.       Assessment/ Plan:     Follow-up and Dispositions    Return in about 6 months (around 02/27/2023).         1. Mild intermittent asthma without complication      2. ICD (implantable cardioverter-defibrillator) in place      3. OSA (obstructive sleep apnea)      4. Sarcoidosis      5. Mixed hyperlipidemia      6. Primary hypertension      7. Longstanding persistent atrial fibrillation ([place])            I have discussed the diagnosis with the patient and the intended plan as seen in the above orders.  The patient has received an after-visit summary and questions were answered concerning future plans.  Pt conveyed understanding of plan.      Medication Side Effects  and Warnings were discussed with patient: Yes  Patient Labs were reviewed: Yes  Patient Past Records were reviewed:  Yes    Lesle Chris, APRN - NP

## 2023-01-08 LAB — FECAL DNA COLORECTAL CANCER SCREENING (COLOGUARD): FIT-DNA (Cologuard): NEGATIVE

## 2023-01-31 ENCOUNTER — Telehealth: Admit: 2023-01-31 | Discharge: 2023-01-31 | Payer: PRIVATE HEALTH INSURANCE | Attending: Family | Primary: Family

## 2023-01-31 DIAGNOSIS — J0110 Acute frontal sinusitis, unspecified: Secondary | ICD-10-CM

## 2023-01-31 MED ORDER — AZITHROMYCIN 250 MG PO TABS
250 MG | ORAL_TABLET | ORAL | 0 refills | Status: AC
Start: 2023-01-31 — End: 2023-02-10

## 2023-01-31 NOTE — Progress Notes (Signed)
Heather Sosa, was evaluated through a synchronous (real-time) audio-video encounter. The patient (or guardian if applicable) is aware that this is a billable service, which includes applicable co-pays. This Virtual Visit was conducted with patient's (and/or legal guardian's) consent. Patient identification was verified, and a caregiver was present when appropriate.   The patient was located at Home: [address]  [place] [address]  Provider was located at Home (Appt Dept State): [place]  Confirm you are appropriately licensed, registered, or certified to deliver care in the state where the patient is located as indicated above. If you are not or unsure, please re-schedule the visit: Yes, I confirm.     Heather Sosa (DOB:  06-19-1966) is a Established patient, presenting virtually for evaluation of the following:    Assessment & Plan   Below is the assessment and plan developed based on review of pertinent history, physical exam, labs, studies, and medications.  1. Acute non-recurrent frontal sinusitis  -     azithromycin (ZITHROMAX) 250 MG tablet; 500mg  on day 1 followed by 250mg  on days 2 - 5, Disp-6 tablet, R-0Normal    Advised home covid test    No follow-ups on file.       Subjective   HPI  Review of Systems    Sick visit  She has been having sinus pressure/congestion since Sunday with symptoms   +sneezing, coughing  No fevers  Has not taken a covid test  She has nasal spray but has not used       Objective   Patient-Reported Vitals  No data recorded     Physical Exam  [INSTRUCTIONS:  "[x] " Indicates a positive item  "[] " Indicates a negative item  -- DELETE ALL ITEMS NOT EXAMINED]    Constitutional: [x]  Appears well-developed and well-nourished [x]  No apparent distress      []  Abnormal -     Mental status: [x]  Alert and awake  [x]  Oriented to person/place/time [x]  Able to follow commands    []  Abnormal -     Eyes:   EOM    [x]   Normal    []  Abnormal -   Sclera  [x]   Normal    []  Abnormal -          Discharge  [x]   None visible   []  Abnormal -     HENT: [x]  Normocephalic, atraumatic  []  Abnormal -   [x]  Mouth/Throat: Mucous membranes are moist    External Ears [x]  Normal  []  Abnormal -    Neck: [x]  No visualized mass []  Abnormal -     Pulmonary/Chest: [x]  Respiratory effort normal   [x]  No visualized signs of difficulty breathing or respiratory distress        []  Abnormal -      Musculoskeletal:   [x]  Normal gait with no signs of ataxia         [x]  Normal range of motion of neck        []  Abnormal -     Neurological:        [x]  No Facial Asymmetry (Cranial nerve 7 motor function) (limited exam due to video visit)          [x]  No gaze palsy        []  Abnormal -          Skin:        [x]  No significant exanthematous lesions or discoloration noted on facial skin         []   Abnormal -            Psychiatric:       [x]  Normal Affect []  Abnormal -        [x]  No Hallucinations    Other pertinent observable physical exam findings:-             --Amado Nash, APRN - NP

## 2023-02-28 ENCOUNTER — Encounter: Payer: PRIVATE HEALTH INSURANCE | Attending: Family | Primary: Family

## 2023-03-28 ENCOUNTER — Telehealth: Admit: 2023-03-28 | Discharge: 2023-03-28 | Payer: PRIVATE HEALTH INSURANCE | Attending: Family | Primary: Family

## 2023-03-28 DIAGNOSIS — E559 Vitamin D deficiency, unspecified: Secondary | ICD-10-CM

## 2023-03-28 MED ORDER — VITAMIN D (ERGOCALCIFEROL) 1.25 MG (50000 UT) PO CAPS
1.25 | ORAL_CAPSULE | ORAL | 0 refills | Status: DC
Start: 2023-03-28 — End: 2023-03-28

## 2023-03-28 MED ORDER — VITAMIN D (ERGOCALCIFEROL) 1.25 MG (50000 UT) PO CAPS
1.25 | ORAL_CAPSULE | ORAL | 0 refills | Status: DC
Start: 2023-03-28 — End: 2023-05-28

## 2023-03-28 NOTE — Progress Notes (Signed)
 Heather Sosa, was evaluated through a synchronous (real-time) audio-video encounter. The patient (or guardian if applicable) is aware that this is a billable service, which includes applicable co-pays. This Virtual Visit was conducted with patient's (and/or legal guardian's) consent. Patient identification was verified, and a caregiver was present when appropriate.   The patient was located at Home: [address]  [place] [address]  Provider was located at Home (Appt Dept State): [place]  Confirm you are appropriately licensed, registered, or certified to deliver care in the state where the patient is located as indicated above. If you are not or unsure, please re-schedule the visit: Yes, I confirm.     Heather Sosa (DOB:  05/02/1966) is a Established patient, presenting virtually for evaluation of the following:      Below is the assessment and plan developed based on review of pertinent history, physical exam, labs, studies, and medications.     Assessment & Plan  Vitamin D  deficiency       Orders:    vitamin D  (ERGOCALCIFEROL ) 1.25 MG (50000 UT) CAPS capsule; Take 1 capsule by mouth once a week for 10 doses    OSA (obstructive sleep apnea)            ICD (implantable cardioverter-defibrillator) in place            High cholesterol              Return in about 3 months (around 06/27/2023) for in person virtual or in person.       Subjective   HPI  Review of Systems    Hypertension ROS:  taking medications as instructed, no medication side effects noted, no TIAs, no chest pain on exertion, no dyspnea on exertion, no swelling of ankles    BP Readings from Last 3 Encounters:   08/29/22 114/79   02/26/22 130/84   02/13/21 123/81     Recently started on enteresto and she feels it is making her urinate more    Labs recently done thru [facility]- very low vitamin D , not on supp    Questionable sleep apnea- will fu w pulm       Objective   Patient-Reported Vitals  No data recorded     Physical Exam  [INSTRUCTIONS:  [x]   Indicates a positive item  []  Indicates a negative item  -- DELETE ALL ITEMS NOT EXAMINED]    Constitutional: [x]  Appears well-developed and well-nourished [x]  No apparent distress      []  Abnormal -     Mental status: [x]  Alert and awake  [x]  Oriented to person/place/time [x]  Able to follow commands    []  Abnormal -     Eyes:   EOM    [x]   Normal    []  Abnormal -   Sclera  [x]   Normal    []  Abnormal -          Discharge [x]   None visible   []  Abnormal -     HENT: [x]  Normocephalic, atraumatic  []  Abnormal -   [x]  Mouth/Throat: Mucous membranes are moist    External Ears [x]  Normal  []  Abnormal -    Neck: [x]  No visualized mass []  Abnormal -     Pulmonary/Chest: [x]  Respiratory effort normal   [x]  No visualized signs of difficulty breathing or respiratory distress        []  Abnormal -      Musculoskeletal:   [x]  Normal gait with no signs of ataxia         [  x] Normal range of motion of neck        []  Abnormal -     Neurological:        [x]  No Facial Asymmetry (Cranial nerve 7 motor function) (limited exam due to video visit)          [x]  No gaze palsy        []  Abnormal -          Skin:        [x]  No significant exanthematous lesions or discoloration noted on facial skin         []  Abnormal -            Psychiatric:       [x]  Normal Affect []  Abnormal -        [x]  No Hallucinations    Other pertinent observable physical exam findings:-             --Magalene Lane, APRN - NP

## 2023-03-28 NOTE — Progress Notes (Signed)
 Chief Complaint   Patient presents with    Follow-up     Have you been to the ER, urgent care clinic since your last visit?  Hospitalized since your last visit?    NO    "Have you seen or consulted any other health care providers outside our system since your last visit?"    NO        Click Here for Release of Records Request  HIPPA confirmed by two patient identifiers.

## 2023-04-19 ENCOUNTER — Encounter

## 2023-05-24 ENCOUNTER — Ambulatory Visit: Payer: PRIVATE HEALTH INSURANCE | Primary: Family

## 2023-05-25 ENCOUNTER — Encounter

## 2023-05-28 MED ORDER — VITAMIN D (ERGOCALCIFEROL) 1.25 MG (50000 UT) PO CAPS
1.2550000 MG (50000 UT) | ORAL_CAPSULE | ORAL | 0 refills | Status: AC
Start: 2023-05-28 — End: 2023-07-17

## 2023-06-18 ENCOUNTER — Inpatient Hospital Stay
Admit: 2023-06-18 | Disposition: A | Discharge status: Home / Self Care | Payer: PRIVATE HEALTH INSURANCE | Source: Ambulatory Visit | Admitting: Family | Primary: Family

## 2023-06-18 VITALS — Ht 66.0 in | Wt 256.0 lb

## 2023-06-18 DIAGNOSIS — Z1231 Encounter for screening mammogram for malignant neoplasm of breast: Principal | ICD-10-CM

## 2023-07-16 ENCOUNTER — Encounter

## 2023-07-17 MED ORDER — VITAMIN D (ERGOCALCIFEROL) 1.25 MG (50000 UT) PO CAPS
1.25 | ORAL_CAPSULE | ORAL | 0 refills | Status: DC
Start: 2023-07-17 — End: 2024-07-16

## 2023-08-05 NOTE — Telephone Encounter (Signed)
Pt was advised that OTC cold and flu medication is ok to take.

## 2023-08-05 NOTE — Telephone Encounter (Signed)
Patient is requesting return call from nurse or provider with recommendations on OTC meds to take for the cold she currently has, that will not interact with meds she's currently taking.

## 2023-08-13 ENCOUNTER — Ambulatory Visit: Payer: PRIVATE HEALTH INSURANCE | Attending: Family | Primary: Family

## 2023-08-29 ENCOUNTER — Telehealth: Admit: 2023-08-29 | Discharge: 2023-08-29 | Payer: PRIVATE HEALTH INSURANCE | Attending: Family | Primary: Family

## 2023-08-29 DIAGNOSIS — I10 Essential (primary) hypertension: Secondary | ICD-10-CM

## 2023-08-29 NOTE — Progress Notes (Signed)
"  Have you been to the ER, urgent care clinic since your last visit?  Hospitalized since your last visit?"    NO    "Have you seen or consulted any other health care providers outside our system since your last visit?"    NO  Chief Complaint   Patient presents with    Follow-up

## 2023-08-29 NOTE — Progress Notes (Signed)
 Heather Sosa, was evaluated through a synchronous (real-time) audio-video encounter. The patient (or guardian if applicable) is aware that this is a billable service, which includes applicable co-pays. This Virtual Visit was conducted with patient's (and/or legal guardian's) consent. Patient identification was verified, and a caregiver was present when appropriate.   The patient was located at Home: [address]  [place] [address]  Provider was located at Home (Appt Dept State): [place]  Confirm you are appropriately licensed, registered, or certified to deliver care in the state where the patient is located as indicated above. If you are not or unsure, please re-schedule the visit: Yes, I confirm.     Heather Sosa (DOB:  09/11/1965) is a Established patient, presenting virtually for evaluation of the following:      Below is the assessment and plan developed based on review of pertinent history, physical exam, labs, studies, and medications.     Assessment & Plan  Primary hypertension       Orders:    Magnesium; Future    Lipid Panel; Future    Comprehensive Metabolic Panel; Future      No follow-ups on file.       Subjective   HPI  Review of Systems    A-fib: S/p cardioversion on 2/9, she continues to follow with cardiology and is taking Eliquis  +ICD in place  Recent lab work with very slightly low magnesium levels will recheck.    She denies A-fib symptoms at this time, no leg swelling, shortness of breath is at baseline given asthma and sarcoidosis, no chest pain        Mother passed- 24, unsure reason which has understandably been very hard on the              Objective   Patient-Reported Vitals  No data recorded     Physical Exam  [INSTRUCTIONS:  "[x] " Indicates a positive item  "[] " Indicates a negative item  -- DELETE ALL ITEMS NOT EXAMINED]    Constitutional: [x]  Appears well-developed and well-nourished [x]  No apparent distress      []  Abnormal -     Mental status: [x]  Alert and awake  [x]  Oriented  to person/place/time [x]  Able to follow commands    []  Abnormal -     Eyes:   EOM    [x]   Normal    []  Abnormal -   Sclera  [x]   Normal    []  Abnormal -          Discharge [x]   None visible   []  Abnormal -     HENT: [x]  Normocephalic, atraumatic  []  Abnormal -   [x]  Mouth/Throat: Mucous membranes are moist    External Ears [x]  Normal  []  Abnormal -    Neck: [x]  No visualized mass []  Abnormal -     Pulmonary/Chest: [x]  Respiratory effort normal   [x]  No visualized signs of difficulty breathing or respiratory distress        []  Abnormal -      Musculoskeletal:   [x]  Normal gait with no signs of ataxia         [x]  Normal range of motion of neck        []  Abnormal -     Neurological:        [x]  No Facial Asymmetry (Cranial nerve 7 motor function) (limited exam due to video visit)          [x]  No gaze palsy        []   Abnormal -          Skin:        [x]  No significant exanthematous lesions or discoloration noted on facial skin         []  Abnormal -            Psychiatric:       [x]  Normal Affect []  Abnormal -        [x]  No Hallucinations    Other pertinent observable physical exam findings:-             --Amado Nash, APRN - NP

## 2023-09-11 ENCOUNTER — Encounter

## 2023-09-12 ENCOUNTER — Encounter

## 2023-09-12 LAB — COMPREHENSIVE METABOLIC PANEL
ALT: 21 IU/L (ref 0–32)
AST: 28 IU/L (ref 0–40)
Albumin: 4 g/dL (ref 3.8–4.9)
Alkaline Phosphatase: 163 IU/L — ABNORMAL HIGH (ref 44–121)
BUN/Creatinine Ratio: 13 (ref 9–23)
BUN: 10 mg/dL (ref 6–24)
CO2: 25 mmol/L (ref 20–29)
Calcium: 9.6 mg/dL (ref 8.7–10.2)
Chloride: 100 mmol/L (ref 96–106)
Creatinine: 0.79 mg/dL (ref 0.57–1.00)
Est, Glom Filt Rate: 88 mL/min/{1.73_m2} (ref >59)
Globulin, Total: 3.3 g/dL (ref 1.5–4.5)
Glucose: 78 mg/dL (ref 70–99)
Potassium: 4.4 mmol/L (ref 3.5–5.2)
Sodium: 137 mmol/L (ref 134–144)
Total Bilirubin: 0.9 mg/dL (ref 0.0–1.2)
Total Protein: 7.3 g/dL (ref 6.0–8.5)

## 2023-09-12 LAB — CARDIOVASCULAR REPORT

## 2023-09-12 LAB — LIPID PANEL
Cholesterol, Total: 181 mg/dL (ref 100–199)
HDL: 36 mg/dL — ABNORMAL LOW (ref >39)
LDL Cholesterol: 128 mg/dL — ABNORMAL HIGH (ref 0–99)
Triglycerides: 91 mg/dL (ref 0–149)
VLDL Cholesterol Calculated: 17 mg/dL (ref 5–40)

## 2023-09-12 LAB — MAGNESIUM: Magnesium: 1.8 mg/dL (ref 1.6–2.3)

## 2023-10-17 ENCOUNTER — Encounter

## 2023-10-18 LAB — COMPREHENSIVE METABOLIC PANEL
ALT: 16 IU/L (ref 0–32)
AST: 22 IU/L (ref 0–40)
Albumin: 3.9 g/dL (ref 3.8–4.9)
Alkaline Phosphatase: 159 IU/L — ABNORMAL HIGH (ref 44–121)
BUN/Creatinine Ratio: 17 (ref 9–23)
BUN: 13 mg/dL (ref 6–24)
CO2: 24 mmol/L (ref 20–29)
Calcium: 9.2 mg/dL (ref 8.7–10.2)
Chloride: 101 mmol/L (ref 96–106)
Creatinine: 0.76 mg/dL (ref 0.57–1.00)
Est, Glom Filt Rate: 92 mL/min/{1.73_m2} (ref >59)
Globulin, Total: 3.4 g/dL (ref 1.5–4.5)
Glucose: 82 mg/dL (ref 70–99)
Potassium: 4.1 mmol/L (ref 3.5–5.2)
Sodium: 139 mmol/L (ref 134–144)
Total Bilirubin: 1.2 mg/dL (ref 0.0–1.2)
Total Protein: 7.3 g/dL (ref 6.0–8.5)

## 2023-10-18 LAB — VITAMIN D 25 HYDROXY: Vit D, 25-Hydroxy: 37 ng/mL (ref 30.0–100.0)

## 2023-10-18 LAB — GAMMA GT: GGT: 18 IU/L (ref 0–60)

## 2023-12-19 ENCOUNTER — Telehealth: Admit: 2023-12-19 | Discharge: 2023-12-19 | Payer: Medicaid (Managed Care) | Attending: Family | Primary: Family

## 2023-12-19 DIAGNOSIS — Z95810 Presence of automatic (implantable) cardiac defibrillator: Secondary | ICD-10-CM

## 2023-12-19 MED ORDER — GABAPENTIN 100 MG PO CAPS
100 | ORAL_CAPSULE | Freq: Two times a day (BID) | ORAL | 1 refills | Status: AC
Start: 2023-12-19 — End: 2024-07-16

## 2023-12-19 NOTE — Progress Notes (Signed)
 Heather Sosa is a 58 y.o. female  Have you been to the ER, urgent care clinic since your last visit?  Hospitalized since your last visit?   NO    Have you seen or consulted any other health care providers outside our system since your last visit?   NO           Chief Complaint   Patient presents with    Follow-up

## 2023-12-19 NOTE — Progress Notes (Signed)
 Heather Sosa, was evaluated through a synchronous (real-time) audio-video encounter. The patient (or guardian if applicable) is aware that this is a billable service, which includes applicable co-pays. This Virtual Visit was conducted with patient's (and/or legal guardian's) consent. Patient identification was verified, and a caregiver was present when appropriate.   The patient was located at Home: [address]   [place] [address]  Provider was located at Home (Appt Dept State): [place]  Confirm you are appropriately licensed, registered, or certified to deliver care in the state where the patient is located as indicated above. If you are not or unsure, please re-schedule the visit: Yes, I confirm.     Heather Sosa (DOB:  [birth year]) is a Established patient, presenting virtually for evaluation of the following:      Below is the assessment and plan developed based on review of pertinent history, physical exam, labs, studies, and medications.     Assessment & Plan  ICD (implantable cardioverter-defibrillator) in place  Cardiology         Sarcoidosis            Neuropathy  If bothersome she can try some gabapentin this really does sound like it is muscle spasms from inactivity after her ablation.  Advise return to ER if acute new onset worsening symptoms otherwise can schedule with neurology    Orders:    gabapentin (NEURONTIN) 100 MG capsule; Take 1 capsule by mouth 2 times daily for 180 days. Intended supply: 90 days Max Daily Amount: 200 mg      No follow-ups on file.       Subjective   HPI  Review of Systems    BP Readings from Last 3 Encounters:   08/29/22 114/79   02/26/22 130/84   02/13/21 123/81         She was set for in person but her car broke    Hyperlipidemia tolerating statin      Sarcoidosis follows with [facility]    ICD  Ablation done 5/12 then began feeling tingling numbness to head she presented to  [facility] ER 5/23 w  normal workup apart from mild anemia.  She was referred to neurology has yet  to get this scheduled  The tingling was to her head, shoulders chest wall she states the last time she had a procedure this happened as well and it self resolved.  She states that the symptoms are beginning to improve.  No chest pain, shortness of breath confusion dizziness or extremity weakness         Objective   Patient-Reported Vitals  No data recorded     Physical Exam  [INSTRUCTIONS:  [x]  Indicates a positive item  []  Indicates a negative item  -- DELETE ALL ITEMS NOT EXAMINED]    Constitutional: [x]  Appears well-developed and well-nourished [x]  No apparent distress      []  Abnormal -     Mental status: [x]  Alert and awake  [x]  Oriented to person/place/time [x]  Able to follow commands    []  Abnormal -     Eyes:   EOM    [x]   Normal    []  Abnormal -   Sclera  [x]   Normal    []  Abnormal -          Discharge [x]   None visible   []  Abnormal -     HENT: [x]  Normocephalic, atraumatic  []  Abnormal -   [x]  Mouth/Throat: Mucous membranes are moist    External  Ears [x]  Normal  []  Abnormal -    Neck: [x]  No visualized mass []  Abnormal -     Pulmonary/Chest: [x]  Respiratory effort normal   [x]  No visualized signs of difficulty breathing or respiratory distress        []  Abnormal -      Musculoskeletal:   [x]  Normal gait with no signs of ataxia         [x]  Normal range of motion of neck        []  Abnormal -     Neurological:        [x]  No Facial Asymmetry (Cranial nerve 7 motor function) (limited exam due to video visit)          [x]  No gaze palsy        []  Abnormal -          Skin:        [x]  No significant exanthematous lesions or discoloration noted on facial skin         []  Abnormal -            Psychiatric:       [x]  Normal Affect []  Abnormal -        [x]  No Hallucinations    Other pertinent observable physical exam findings:-             --Magalene Lane, APRN - NP

## 2023-12-20 NOTE — Assessment & Plan Note (Addendum)
 Cardiology

## 2024-02-03 NOTE — Telephone Encounter (Signed)
 Patient is requesting a call back to speak with you about having a form completet

## 2024-02-03 NOTE — Telephone Encounter (Signed)
 Pt states has paperwork from [employer] that's needs to be filled out by doctor also need [employer] program entyre care problem were her son can take care of her.

## 2024-02-04 NOTE — Telephone Encounter (Signed)
 Pt states will drop it off

## 2024-02-28 NOTE — Telephone Encounter (Signed)
 Called pt regarding health matter which is medical form for a care [employer] also pt states she wants to speak with Carrolyn Libel personally

## 2024-03-03 NOTE — Telephone Encounter (Signed)
 Pt called to inform that she will be possibly getting another ablation due to recurrent afib  She is seeing cardiology regularly at [facility]  She is taking eliquis

## 2024-03-03 NOTE — Telephone Encounter (Signed)
 Reviewed pt chart and she has past dx of sleep apnea, she is not wearing cpap, advised she fu w her pulm at [facility] who she sees for sarcoid regarding thi

## 2024-04-16 ENCOUNTER — Encounter: Admit: 2024-04-16 | Discharge: 2024-04-16 | Payer: Medicaid (Managed Care) | Attending: Family | Primary: Family

## 2024-04-16 DIAGNOSIS — 1 ERRONEOUS ENCOUNTER ICD10: Principal | ICD-10-CM

## 2024-04-16 NOTE — Progress Notes (Signed)
"  Heather Sosa is a 58 y.o. female  Have you been to the ER, urgent care clinic since your last visit?  Hospitalized since your last visit?   NO    Have you seen or consulted any other health care providers outside our system since your last visit?   NO           Chief Complaint   Patient presents with    Chest Congestion     Pt states when lay down she can't sleep always brings up phelm       "

## 2024-04-16 NOTE — Progress Notes (Signed)
"  The connection for the virtual visit is very poor unable to see or hear patient video is choppy.  Nursing spoke with patient and she will present to urgent care  "

## 2024-05-20 ENCOUNTER — Encounter

## 2024-06-18 ENCOUNTER — Ambulatory Visit: Payer: Medicaid (Managed Care) | Primary: Family

## 2024-07-16 ENCOUNTER — Telehealth: Admit: 2024-07-16 | Discharge: 2024-07-16 | Payer: Medicaid (Managed Care) | Attending: Family | Primary: Family

## 2024-07-16 DIAGNOSIS — Z95810 Presence of automatic (implantable) cardiac defibrillator: Principal | ICD-10-CM

## 2024-07-16 NOTE — Assessment & Plan Note (Signed)
 SABRA

## 2024-07-16 NOTE — Progress Notes (Signed)
 "    Heather Sosa, was evaluated through a synchronous (real-time) audio-video encounter. The patient (or guardian if applicable) is aware that this is a billable service, which includes applicable co-pays. This Virtual Visit was conducted with patient's (and/or legal guardian's) consent. Patient identification was verified, and a caregiver was present when appropriate.   The patient was located at Home: [address]   [place] [address]  Provider was located at Home (Appt Dept State): [place]  Confirm you are appropriately licensed, registered, or certified to deliver care in the state where the patient is located as indicated above. If you are not or unsure, please re-schedule the visit: Yes, I confirm.     Heather Sosa (DOB:  May 22, 1966) is a Established patient, presenting virtually for evaluation of the following:      Below is the assessment and plan developed based on review of pertinent history, physical exam, labs, studies, and medications.     Assessment & Plan  ICD (implantable cardioverter-defibrillator) in place            OSA (obstructive sleep apnea)   Discussed the importance of treatment         Sarcoidosis            High cholesterol            Longstanding persistent atrial fibrillation [facility])   cardiology           Return in about 6 months (around 01/13/2025).       Subjective   HPI  Review of Systems    Sarcoidosis: follows w rheum and cardiology- now undergoing infusion  PET scan in the future     A-fib: follows w cardiology, scheduled for another ablation 07/27/24    She is having some fluid retention, cardiology started her on furosemide  She has been having some higher sodium foods    OSA: does not have CPAP yet, she is going to follow up on this w pulm      Objective   Patient-Reported Vitals  No data recorded     Physical Exam  [INSTRUCTIONS:  [x]  Indicates a positive item  []  Indicates a negative item  -- DELETE ALL ITEMS NOT EXAMINED]    Constitutional: [x]  Appears  well-developed and well-nourished [x]  No apparent distress      []  Abnormal -     Mental status: [x]  Alert and awake  [x]  Oriented to person/place/time [x]  Able to follow commands    []  Abnormal -     Eyes:   EOM    [x]   Normal    []  Abnormal -   Sclera  [x]   Normal    []  Abnormal -          Discharge [x]   None visible   []  Abnormal -     HENT: [x]  Normocephalic, atraumatic  []  Abnormal -   [x]  Mouth/Throat: Mucous membranes are moist    External Ears [x]  Normal  []  Abnormal -    Neck: [x]  No visualized mass []  Abnormal -     Pulmonary/Chest: [x]  Respiratory effort normal   [x]  No visualized signs of difficulty breathing or respiratory distress        []  Abnormal -      Musculoskeletal:   [x]  Normal gait with no signs of ataxia         [x]  Normal range of motion of neck        []  Abnormal -  Neurological:        [x]  No Facial Asymmetry (Cranial nerve 7 motor function) (limited exam due to video visit)          [x]  No gaze palsy        []  Abnormal -          Skin:        [x]  No significant exanthematous lesions or discoloration noted on facial skin         []  Abnormal -            Psychiatric:       [x]  Normal Affect []  Abnormal -        [x]  No Hallucinations    Other pertinent observable physical exam findings:-             --Magalene Lane, APRN - NP         "

## 2024-07-16 NOTE — Assessment & Plan Note (Signed)
"   Discussed the importance of treatment         "

## 2024-07-16 NOTE — Progress Notes (Signed)
"  Heather Sosa is a 59 y.o. female  Have you been to the ER, urgent care clinic since your last visit?  Hospitalized since your last visit?   NO    Have you seen or consulted any other health care providers outside our system since your last visit?   NO           Chief Complaint   Patient presents with    Follow-up       "
